# Patient Record
Sex: Male | Born: 1945 | Race: Black or African American | Hispanic: No | Marital: Single | Smoking: Former smoker
Health system: Southern US, Community
[De-identification: ages and names within clinical notes are randomized; demographics above are authoritative.]

## PROBLEM LIST (undated history)

## (undated) DIAGNOSIS — G629 Polyneuropathy, unspecified: Secondary | ICD-10-CM

## (undated) DIAGNOSIS — E119 Type 2 diabetes mellitus without complications: Secondary | ICD-10-CM

## (undated) DIAGNOSIS — I10 Essential (primary) hypertension: Secondary | ICD-10-CM

## (undated) DIAGNOSIS — N289 Disorder of kidney and ureter, unspecified: Secondary | ICD-10-CM

## (undated) HISTORY — PX: COLONOSCOPY: SHX5424

## (undated) SURGERY — Surgical Case
Anesthesia: *Unknown

---

## 2008-07-16 ENCOUNTER — Emergency Department (HOSPITAL_COMMUNITY): Admission: EM | Admit: 2008-07-16 | Discharge: 2008-07-16 | Payer: Self-pay | Admitting: Emergency Medicine

## 2010-05-07 LAB — CBC
MCHC: 33.7 g/dL (ref 30.0–36.0)
RDW: 13.7 % (ref 11.5–15.5)

## 2010-05-07 LAB — DIFFERENTIAL
Basophils Relative: 1 % (ref 0–1)
Lymphs Abs: 1.7 10*3/uL (ref 0.7–4.0)
Monocytes Relative: 12 % (ref 3–12)
Neutro Abs: 1.2 10*3/uL — ABNORMAL LOW (ref 1.7–7.7)
Neutrophils Relative %: 33 % — ABNORMAL LOW (ref 43–77)

## 2010-05-07 LAB — CK TOTAL AND CKMB (NOT AT ARMC)
CK, MB: 4 ng/mL (ref 0.3–4.0)
Total CK: 290 U/L — ABNORMAL HIGH (ref 7–232)

## 2010-05-07 LAB — COMPREHENSIVE METABOLIC PANEL
Alkaline Phosphatase: 64 U/L (ref 39–117)
BUN: 44 mg/dL — ABNORMAL HIGH (ref 6–23)
Calcium: 9.1 mg/dL (ref 8.4–10.5)
GFR calc non Af Amer: 19 mL/min — ABNORMAL LOW (ref >60)
Glucose, Bld: 103 mg/dL — ABNORMAL HIGH (ref 70–99)
Total Protein: 7.6 g/dL (ref 6.0–8.3)

## 2010-05-07 LAB — RAPID URINE DRUG SCREEN, HOSP PERFORMED
Amphetamines: NOT DETECTED
Barbiturates: NOT DETECTED
Benzodiazepines: POSITIVE — AB
Cocaine: NOT DETECTED
Opiates: NOT DETECTED

## 2010-05-07 LAB — URINALYSIS, ROUTINE W REFLEX MICROSCOPIC
Glucose, UA: NEGATIVE mg/dL
Hgb urine dipstick: NEGATIVE
Specific Gravity, Urine: 1.006 (ref 1.005–1.030)
pH: 5.5 (ref 5.0–8.0)

## 2010-05-07 LAB — TROPONIN I: Troponin I: 0.02 ng/mL (ref 0.00–0.06)

## 2016-12-13 ENCOUNTER — Emergency Department (HOSPITAL_COMMUNITY): Payer: Non-veteran care

## 2016-12-13 ENCOUNTER — Other Ambulatory Visit: Payer: Self-pay

## 2016-12-13 ENCOUNTER — Encounter (HOSPITAL_COMMUNITY): Payer: Self-pay

## 2016-12-13 ENCOUNTER — Inpatient Hospital Stay (HOSPITAL_COMMUNITY): Payer: Non-veteran care

## 2016-12-13 ENCOUNTER — Inpatient Hospital Stay (HOSPITAL_COMMUNITY)
Admission: EM | Admit: 2016-12-13 | Discharge: 2016-12-21 | DRG: 673 | Discharge status: Against Medical Advice | Payer: Non-veteran care | Attending: Family Medicine | Admitting: Family Medicine

## 2016-12-13 DIAGNOSIS — I10 Essential (primary) hypertension: Secondary | ICD-10-CM | POA: Diagnosis not present

## 2016-12-13 DIAGNOSIS — E8770 Fluid overload, unspecified: Secondary | ICD-10-CM | POA: Diagnosis present

## 2016-12-13 DIAGNOSIS — E538 Deficiency of other specified B group vitamins: Secondary | ICD-10-CM | POA: Diagnosis present

## 2016-12-13 DIAGNOSIS — Z720 Tobacco use: Secondary | ICD-10-CM | POA: Diagnosis not present

## 2016-12-13 DIAGNOSIS — N185 Chronic kidney disease, stage 5: Secondary | ICD-10-CM | POA: Diagnosis not present

## 2016-12-13 DIAGNOSIS — E1122 Type 2 diabetes mellitus with diabetic chronic kidney disease: Secondary | ICD-10-CM | POA: Diagnosis present

## 2016-12-13 DIAGNOSIS — D649 Anemia, unspecified: Secondary | ICD-10-CM | POA: Diagnosis not present

## 2016-12-13 DIAGNOSIS — N400 Enlarged prostate without lower urinary tract symptoms: Secondary | ICD-10-CM | POA: Diagnosis present

## 2016-12-13 DIAGNOSIS — E1140 Type 2 diabetes mellitus with diabetic neuropathy, unspecified: Secondary | ICD-10-CM | POA: Diagnosis present

## 2016-12-13 DIAGNOSIS — Z79899 Other long term (current) drug therapy: Secondary | ICD-10-CM | POA: Diagnosis not present

## 2016-12-13 DIAGNOSIS — Z8249 Family history of ischemic heart disease and other diseases of the circulatory system: Secondary | ICD-10-CM | POA: Diagnosis not present

## 2016-12-13 DIAGNOSIS — Z992 Dependence on renal dialysis: Secondary | ICD-10-CM

## 2016-12-13 DIAGNOSIS — N179 Acute kidney failure, unspecified: Secondary | ICD-10-CM | POA: Diagnosis present

## 2016-12-13 DIAGNOSIS — Z23 Encounter for immunization: Secondary | ICD-10-CM | POA: Diagnosis not present

## 2016-12-13 DIAGNOSIS — Z01810 Encounter for preprocedural cardiovascular examination: Secondary | ICD-10-CM | POA: Diagnosis not present

## 2016-12-13 DIAGNOSIS — N2581 Secondary hyperparathyroidism of renal origin: Secondary | ICD-10-CM | POA: Diagnosis present

## 2016-12-13 DIAGNOSIS — E8779 Other fluid overload: Secondary | ICD-10-CM

## 2016-12-13 DIAGNOSIS — D631 Anemia in chronic kidney disease: Secondary | ICD-10-CM | POA: Diagnosis present

## 2016-12-13 DIAGNOSIS — N186 End stage renal disease: Secondary | ICD-10-CM | POA: Diagnosis present

## 2016-12-13 DIAGNOSIS — Z794 Long term (current) use of insulin: Secondary | ICD-10-CM

## 2016-12-13 DIAGNOSIS — I120 Hypertensive chronic kidney disease with stage 5 chronic kidney disease or end stage renal disease: Principal | ICD-10-CM | POA: Diagnosis present

## 2016-12-13 DIAGNOSIS — Z419 Encounter for procedure for purposes other than remedying health state, unspecified: Secondary | ICD-10-CM

## 2016-12-13 DIAGNOSIS — E1129 Type 2 diabetes mellitus with other diabetic kidney complication: Secondary | ICD-10-CM | POA: Diagnosis present

## 2016-12-13 HISTORY — DX: Disorder of kidney and ureter, unspecified: N28.9

## 2016-12-13 HISTORY — DX: Essential (primary) hypertension: I10

## 2016-12-13 HISTORY — DX: Polyneuropathy, unspecified: G62.9

## 2016-12-13 HISTORY — DX: Type 2 diabetes mellitus without complications: E11.9

## 2016-12-13 LAB — BASIC METABOLIC PANEL
Anion gap: 12 (ref 5–15)
BUN: 98 mg/dL — ABNORMAL HIGH (ref 6–20)
CO2: 11 mmol/L — AB (ref 22–32)
Calcium: 5.8 mg/dL — CL (ref 8.9–10.3)
Chloride: 112 mmol/L — ABNORMAL HIGH (ref 101–111)
Creatinine, Ser: 13.73 mg/dL — ABNORMAL HIGH (ref 0.61–1.24)
GFR, EST AFRICAN AMERICAN: 4 mL/min — AB (ref >60)
GFR, EST NON AFRICAN AMERICAN: 3 mL/min — AB (ref >60)
Glucose, Bld: 78 mg/dL (ref 65–99)
Potassium: 4.7 mmol/L (ref 3.5–5.1)
Sodium: 135 mmol/L (ref 135–145)

## 2016-12-13 LAB — URINALYSIS, ROUTINE W REFLEX MICROSCOPIC
BACTERIA UA: NONE SEEN
Bilirubin Urine: NEGATIVE
Glucose, UA: 50 mg/dL — AB
KETONES UR: NEGATIVE mg/dL
Leukocytes, UA: NEGATIVE
Nitrite: NEGATIVE
PH: 6 (ref 5.0–8.0)
PROTEIN: 100 mg/dL — AB
SQUAMOUS EPITHELIAL / LPF: NONE SEEN
Specific Gravity, Urine: 1.008 (ref 1.005–1.030)

## 2016-12-13 LAB — RETICULOCYTES
RBC.: 1.78 MIL/uL — ABNORMAL LOW (ref 4.22–5.81)
RETIC CT PCT: 1.6 % (ref 0.4–3.1)
Retic Count, Absolute: 28.5 10*3/uL (ref 19.0–186.0)

## 2016-12-13 LAB — CBC
HCT: 12.6 % — ABNORMAL LOW (ref 39.0–52.0)
Hemoglobin: 4.1 g/dL — CL (ref 13.0–17.0)
MCH: 25.2 pg — AB (ref 26.0–34.0)
MCHC: 32.5 g/dL (ref 30.0–36.0)
MCV: 77.3 fL — ABNORMAL LOW (ref 78.0–100.0)
PLATELETS: 200 10*3/uL (ref 150–400)
RBC: 1.63 MIL/uL — AB (ref 4.22–5.81)
RDW: 16.4 % — ABNORMAL HIGH (ref 11.5–15.5)
WBC: 4.9 10*3/uL (ref 4.0–10.5)

## 2016-12-13 LAB — PREPARE RBC (CROSSMATCH)

## 2016-12-13 LAB — ABO/RH: ABO/RH(D): A POS

## 2016-12-13 LAB — PROTIME-INR
INR: 1.33
PROTHROMBIN TIME: 16.4 s — AB (ref 11.4–15.2)

## 2016-12-13 LAB — APTT: aPTT: 42 seconds — ABNORMAL HIGH (ref 24–36)

## 2016-12-13 LAB — POC OCCULT BLOOD, ED: FECAL OCCULT BLD: NEGATIVE

## 2016-12-13 LAB — FIBRINOGEN: FIBRINOGEN: 432 mg/dL (ref 210–475)

## 2016-12-13 MED ORDER — TERAZOSIN HCL 1 MG PO CAPS
1.0000 mg | ORAL_CAPSULE | Freq: Every day | ORAL | Status: DC
  Administered 2016-12-14 – 2016-12-21 (×8): 1 mg via ORAL
  Filled 2016-12-13 (×8): qty 1

## 2016-12-13 MED ORDER — POLYETHYLENE GLYCOL 3350 17 G PO PACK
17.0000 g | PACK | Freq: Every day | ORAL | Status: DC | PRN
  Filled 2016-12-13: qty 1

## 2016-12-13 MED ORDER — ACETAMINOPHEN 325 MG PO TABS: 650.0000 mg | ORAL_TABLET | Freq: Four times a day (QID) | ORAL | Status: DC | PRN

## 2016-12-13 MED ORDER — SODIUM CHLORIDE 0.9 % IV SOLN
1.0000 g | Freq: Once | INTRAVENOUS | Status: AC
  Administered 2016-12-14: 1 g via INTRAVENOUS
  Filled 2016-12-13 (×2): qty 10

## 2016-12-13 MED ORDER — SODIUM CHLORIDE 0.9 % IV SOLN
Freq: Once | INTRAVENOUS | Status: AC
  Administered 2016-12-13: 23:00:00 via INTRAVENOUS

## 2016-12-13 MED ORDER — FUROSEMIDE 10 MG/ML IJ SOLN
120.0000 mg | Freq: Two times a day (BID) | INTRAVENOUS | Status: DC
  Administered 2016-12-14 – 2016-12-16 (×5): 120 mg via INTRAVENOUS
  Filled 2016-12-13: qty 10
  Filled 2016-12-13 (×2): qty 12
  Filled 2016-12-13: qty 10
  Filled 2016-12-13: qty 2
  Filled 2016-12-13 (×4): qty 12

## 2016-12-13 MED ORDER — INSULIN ASPART 100 UNIT/ML ~~LOC~~ SOLN
0.0000 [IU] | Freq: Every day | SUBCUTANEOUS | Status: DC
  Administered 2016-12-20: 2 [IU] via SUBCUTANEOUS

## 2016-12-13 MED ORDER — DIAZEPAM 2 MG PO TABS
1.0000 mg | ORAL_TABLET | Freq: Every day | ORAL | Status: DC
  Administered 2016-12-14 – 2016-12-20 (×8): 1 mg via ORAL
  Filled 2016-12-13 (×8): qty 1

## 2016-12-13 MED ORDER — CLONIDINE HCL ER 0.1 MG PO TB12: 0.2000 mg | ORAL_TABLET | Freq: Two times a day (BID) | ORAL | Status: DC

## 2016-12-13 MED ORDER — ZOLPIDEM TARTRATE 5 MG PO TABS
5.0000 mg | ORAL_TABLET | Freq: Every evening | ORAL | Status: DC | PRN
  Administered 2016-12-16 – 2016-12-20 (×4): 5 mg via ORAL
  Filled 2016-12-13 (×6): qty 1

## 2016-12-13 MED ORDER — CALCIUM CARBONATE ANTACID 500 MG PO CHEW
800.0000 mg | CHEWABLE_TABLET | Freq: Two times a day (BID) | ORAL | Status: DC
  Administered 2016-12-14 (×2): 800 mg via ORAL
  Filled 2016-12-13 (×2): qty 4

## 2016-12-13 MED ORDER — HEPARIN SODIUM (PORCINE) 5000 UNIT/ML IJ SOLN
5000.0000 [IU] | Freq: Three times a day (TID) | INTRAMUSCULAR | Status: DC
  Administered 2016-12-14 – 2016-12-21 (×12): 5000 [IU] via SUBCUTANEOUS
  Filled 2016-12-13 (×14): qty 1

## 2016-12-13 MED ORDER — INSULIN ASPART 100 UNIT/ML ~~LOC~~ SOLN
0.0000 [IU] | Freq: Three times a day (TID) | SUBCUTANEOUS | Status: DC
  Administered 2016-12-14 – 2016-12-15 (×2): 1 [IU] via SUBCUTANEOUS
  Administered 2016-12-16: 2 [IU] via SUBCUTANEOUS
  Administered 2016-12-16: 1 [IU] via SUBCUTANEOUS
  Administered 2016-12-17: 7 [IU] via SUBCUTANEOUS
  Administered 2016-12-18 (×2): 1 [IU] via SUBCUTANEOUS

## 2016-12-13 MED ORDER — SODIUM CHLORIDE 0.9 % IV SOLN
1.0000 g | Freq: Once | INTRAVENOUS | Status: AC
  Administered 2016-12-13: 1 g via INTRAVENOUS
  Filled 2016-12-13: qty 10

## 2016-12-13 MED ORDER — VITAMIN D 1000 UNITS PO TABS
1000.0000 [IU] | ORAL_TABLET | Freq: Every day | ORAL | Status: DC
  Administered 2016-12-14 – 2016-12-19 (×6): 1000 [IU] via ORAL
  Filled 2016-12-13 (×7): qty 1

## 2016-12-13 MED ORDER — CALCITRIOL 0.5 MCG PO CAPS
1.0000 ug | ORAL_CAPSULE | Freq: Every day | ORAL | Status: DC
  Administered 2016-12-14 – 2016-12-19 (×6): 1 ug via ORAL
  Filled 2016-12-13 (×5): qty 2

## 2016-12-13 MED ORDER — HYDRALAZINE HCL 20 MG/ML IJ SOLN
5.0000 mg | INTRAMUSCULAR | Status: DC | PRN
  Administered 2016-12-13 – 2016-12-14 (×3): 5 mg via INTRAVENOUS
  Filled 2016-12-13 (×3): qty 1

## 2016-12-13 MED ORDER — ONDANSETRON HCL 4 MG/2ML IJ SOLN: 4.0000 mg | Freq: Three times a day (TID) | INTRAMUSCULAR | Status: DC | PRN

## 2016-12-13 NOTE — ED Provider Notes (Signed)
[facility] Provider Note   CSN: [id number] Arrival date & time: 12/13/16  1756     History   Chief Complaint Chief Complaint  Patient presents with  . Leg Swelling    HPI [name] Dudley is a 71 y.o. male with a hx of Diabetes, retention, neuropathy, chronic kidney disease (unknown baseline creatinine) presents to the Emergency Department complaining of gradual, persistent, progressively worsening peripheral edema onset approximately 1 week ago.  Patient reports that it has progressed all the way up his thighs into his inguinal region.  He reports associated shortness of breath onset approximately 1 month ago.  He reports this has worsened significantly in the last several days to a week.  Patient reports he obtains his primary care at the [facility].  He reports that he previously had discussions about possible dialysis but have not yet moved in that direction.  Patient reports it has been several months since his last set of blood work.  He is also reporting significant and severe muscle cramps worse with use of any muscle group.  Patient denies headache, chest pain, abdominal pain, nausea, vomiting, diarrhea, weakness, dizziness, syncope.  Patient denies hematuria, melena, hematochezia, epistaxis, gingival bleeding.  He denies a previous bleeding disorder.  He denies being anticoagulated.  The history is provided by the patient and medical records. No language interpreter was used.    Past Medical History:  Diagnosis Date  . Diabetes mellitus without complication (HCC)   . Hypertension   . Neuropathy   . Renal disorder     Patient Active Problem List   Diagnosis Date Noted  . History of end stage renal disease 12/13/2016  . Anemia due to end stage renal disease (HCC) 12/13/2016  . Hypocalcemia 12/13/2016  . Hypertension     History reviewed. No pertinent surgical history.     Home Medications    Prior to Admission medications   Not on  File    Family History History reviewed. No pertinent family history.  Social History Social History   Tobacco Use  . Smoking status: Unknown If Ever Smoked  . Smokeless tobacco: Never Used  Substance Use Topics  . Alcohol use: No    Frequency: Never  . Drug use: Not on file     Allergies   Patient has no known allergies.   Review of Systems Review of Systems  Constitutional: Positive for fatigue. Negative for appetite change, diaphoresis, fever and unexpected weight change.  HENT: Negative for mouth sores.   Eyes: Negative for visual disturbance.  Respiratory: Positive for shortness of breath (exertional). Negative for cough, chest tightness and wheezing.   Cardiovascular: Positive for leg swelling. Negative for chest pain and palpitations.  Gastrointestinal: Negative for abdominal pain, constipation, diarrhea, nausea and vomiting.  Endocrine: Negative for polydipsia, polyphagia and polyuria.  Genitourinary: Negative for dysuria, frequency, hematuria and urgency.  Musculoskeletal: Negative for back pain and neck stiffness.  Skin: Positive for pallor. Negative for rash.  Allergic/Immunologic: Negative for immunocompromised state.  Neurological: Negative for syncope, light-headedness and headaches.  Hematological: Does not bruise/bleed easily.  Psychiatric/Behavioral: Negative for sleep disturbance. The patient is not nervous/anxious.      Physical Exam Updated Vital Signs BP (!) 179/91   Pulse 75   Temp 97.7 F (36.5 C) (Oral)   Resp 18   SpO2 100%   Physical Exam  Constitutional: He appears well-developed and well-nourished. No distress.  Awake, alert, nontoxic appearance  HENT:  Head: Normocephalic and atraumatic.  Mouth/Throat: Oropharynx is clear and moist. No oropharyngeal exudate.  Very pale conjunctiva and mucous membranes Positive Chvostek's sign  Eyes: Conjunctivae are normal. No scleral icterus.  Neck: Normal range of motion. Neck supple.    Intermittent right sided neck spasm  Cardiovascular: Normal rate, regular rhythm and intact distal pulses.  Pulses:      Radial pulses are 2+ on the right side, and 2+ on the left side.       Dorsalis pedis pulses are 2+ on the right side, and 2+ on the left side.  Capillary refill = 4sec  Pulmonary/Chest: Effort normal and breath sounds normal. No respiratory distress. He has no wheezes.  Equal chest expansion  Abdominal: Soft. Bowel sounds are normal. He exhibits no mass. There is no tenderness. There is no rebound and no guarding.  Genitourinary: Rectal exam shows no external hemorrhoid, no internal hemorrhoid, no fissure, no mass, no tenderness and anal tone normal.  Genitourinary Comments: DRE with brown stool; no gross blood  Musculoskeletal: Normal range of motion. He exhibits edema (significant 3+ pitting edema to the BLE from distal forefoot to 2+ proximal thigh ).  Positive Trousseau  Neurological: He is alert.  Speech is clear and goal oriented Moves extremities without ataxia  Skin: Skin is warm and dry. He is not diaphoretic. There is pallor.  Psychiatric: He has a normal mood and affect.  Nursing note and vitals reviewed.    ED Treatments / Results  Labs (all labs ordered are listed, but only abnormal results are displayed) Labs Reviewed  BASIC METABOLIC PANEL - Abnormal; Notable for the following components:      Result Value   Chloride 112 (*)    CO2 11 (*)    BUN 98 (*)    Creatinine, Ser 13.73 (*)    Calcium 5.8 (*)    GFR calc non Af Amer 3 (*)    GFR calc Af Amer 4 (*)    All other components within normal limits  CBC - Abnormal; Notable for the following components:   RBC 1.63 (*)    Hemoglobin 4.1 (*)    HCT 12.6 (*)    MCV 77.3 (*)    MCH 25.2 (*)    RDW 16.4 (*)    All other components within normal limits  URINALYSIS, ROUTINE W REFLEX MICROSCOPIC - Abnormal; Notable for the following components:   Color, Urine STRAW (*)    Glucose, UA 50 (*)     Hgb urine dipstick MODERATE (*)    Protein, ur 100 (*)    All other components within normal limits  FIBRINOGEN  APTT  PROTIME-INR  VITAMIN B12  FOLATE  IRON AND TIBC  FERRITIN  RETICULOCYTES  POC OCCULT BLOOD, ED  CBG MONITORING, ED  TYPE AND SCREEN  PREPARE RBC (CROSSMATCH)  ABO/RH    EKG  EKG Interpretation  Date/Time:  Friday December 13 2016 18:01:28 EST Ventricular Rate:  77 PR Interval:  196 QRS Duration: 80 QT Interval:  444 QTC Calculation: 502 R Axis:   60 Text Interpretation:  Normal sinus rhythm Prolonged QT Abnormal ECG When compared to prior, slightkly longer QT.  No STEMI Confirmed by Theda Belfast ([id number]) on 12/13/2016 7:35:59 PM       Radiology US Renal  Result Date: 12/14/2016 CLINICAL DATA:  Acute onset of renal insufficiency. EXAM: RENAL / URINARY TRACT ULTRASOUND COMPLETE COMPARISON:  None. FINDINGS: Right Kidney: Length: 8.5 cm. Increased parenchymal echogenicity is noted. A 0.5 cm cyst is noted  at the upper pole of the right kidney. No hydronephrosis visualized. Left Kidney: Length: 8.5 cm. Increased parenchymal echogenicity is noted. A 0.7 cm cyst is noted at the upper pole of the left kidney. No hydronephrosis visualized. Bladder: Appears normal for degree of bladder distention. Small bilateral pleural effusions are noted. IMPRESSION: 1. No evidence of hydronephrosis. 2. Increased renal parenchymal echogenicity raises concern for medical renal disease. 3. Tiny bilateral renal cysts seen. 4. Small bilateral pleural effusions noted. Electronically Signed   By: Roanna Raider M.D.   On: 12/14/2016 03:36   Dg Chest Port 1 View  Result Date: 12/13/2016 CLINICAL DATA:  71 year old male with fluid overload. EXAM: PORTABLE CHEST 1 VIEW COMPARISON:  None. FINDINGS: There small bilateral pleural effusions and minimal associated bibasilar compressive atelectasis. Pneumonia is not excluded. Clinical correlation is recommended. No pneumothorax. Borderline  cardiomegaly with mild vascular congestion. No acute osseous pathology. IMPRESSION: Borderline cardiomegaly with mild vascular congestion and small pleural effusions. Electronically Signed   By: Elgie Collard M.D.   On: 12/13/2016 22:33    Procedures Procedures (including critical care time)  CRITICAL CARE Performed by: Dahlia Client Jhamir Pickup Total critical care time: 60 minutes Critical care time was exclusive of separately billable procedures and treating other patients. Critical care was necessary to treat or prevent imminent or life-threatening deterioration. Critical care was time spent personally by me on the following activities: development of treatment plan with patient and/or surrogate as well as nursing, discussions with consultants, evaluation of patient's response to treatment, examination of patient, obtaining history from patient or surrogate, ordering and performing treatments and interventions, ordering and review of laboratory studies, ordering and review of radiographic studies, pulse oximetry and re-evaluation of patient's condition.   Medications Ordered in ED Medications  0.9 %  sodium chloride infusion (not administered)  calcium gluconate 1 g in sodium chloride 0.9 % 100 mL IVPB (not administered)  hydrALAZINE (APRESOLINE) injection 5 mg (not administered)  calcium gluconate 1 g in sodium chloride 0.9 % 100 mL IVPB (1 g Intravenous New Bag/Given 12/13/16 2129)     Initial Impression / Assessment and Plan / ED Course  I have reviewed the triage vital signs and the nursing notes.  Pertinent labs & imaging results that were available during my care of the patient were reviewed by me and considered in my medical decision making (see chart for details).  Clinical Course as of Dec 13 2198  Fri Dec 13, 2016  2138 Discussed with Dr. Marisue Humble who agrees that pt is does not currently need emergent dialysis, but will need admission and likely dialysis while inpatient.  [HM]    2158 Gust with Dr. Clyde Lundborg who will admit for blood transfusion, calcium and further workup of his acute renal failure.  [HM]    Clinical Course User Index [HM] Dartanyan Deasis, Dahlia Client, [place]    Patient presents with severe anemia and hypocalcemia.  DRE with brown stool.  Denies GI bleeding.  Significant worsening AK I now acute renal failure as compared to lab work on file from 2010.  Unknown baseline creatinine however patient is not currently on dialysis.  On exam, patient is very pale.  No tachycardia or chest pain.  Risk and benefit of a blood transfusion discussed and patient accepts.  Blood ordered.  Will need admission.  The patient was discussed with Dr. Rush Landmark who agrees with the treatment plan.   Final Clinical Impressions(s) / ED Diagnoses   Final diagnoses:  AKI (acute kidney injury) (HCC)  Other hypervolemia  Anemia, unspecified type  Hypocalcemia    ED Discharge Orders    None       Mardene SayerMuthersbaugh, Boyd KerbsHannah, PA-C 12/14/16 [id number]    Tegeler, Canary Brimhristopher J, MD 12/16/16 [id number]

## 2016-12-13 NOTE — Consult Note (Signed)
Lavella LemonsSylvester Bowersox Admit Date: 12/13/2016 12/13/2016 Arita MissSANFORD, Kyeisha Janowicz B Requesting Physician:  Tegeler MD  Reason for Consult:  Renal failure, Anemia HPI:  71 year old male who presented to the emergency room this evening with progressive lower extremity edema.  In his evaluation he was found to have a hemoglobin of 4.1, BUN of 98, creatinine of 13.7, bicarbonate 11, potassium 4.7, sodium of 135.  He [place] [facility] [place]  He follows with a nephrologist and was told 3 months ago that he could start on dialysis but he opted not to.  He has not pursued vascular access.  A creatinine from 2010 in our system was 3.3.  Urine analysis today with 2+ protein and no erythrocytes per high-powered field.  Patient denies use of nonsteroidals.  He denies lower urinary tract symptoms.  He denies significant anorexia, nausea, malaise.  He does endorse on and off emesis.  Other available history includes diabetes and hypertension.  He is quite concerned about any impact of dialysis on his finances which are very tight and transportation.  At length I discussed with him that he has very low kidney function and I strongly recommended initiating dialysis.  Eventually agreed to do so as well as receiving vascular access.  In the emergency room patient has started PRBC transfusion.    Creatinine, Ser (mg/dL)  Date Value  16/10/960411/16/2018 13.73 (H)  07/16/2008 3.32 (H)  ] I/Os:  ROS NSAIDS: denies IV Contrast no TMP/SMX no Hypotension no Balance of 12 systems is negative w/ exceptions as above  PMH  Past Medical History:  Diagnosis Date  . Diabetes mellitus without complication (HCC)   . Hypertension   . Neuropathy   . Renal disorder    PSH History reviewed. No pertinent surgical history. FH History reviewed. No pertinent family history. SH  has an unknown smoking status. he has never used smokeless tobacco. He reports that he does not drink alcohol. His drug history is not  on file. Allergies No Known Allergies Home medications Prior to Admission medications   Medication Sig Start Date End Date Taking? Authorizing Provider  Cholecalciferol (VITAMIN D PO) Take 1 capsule daily by mouth.   Yes [provider]  CLONIDINE HCL PO Take 2 tablets 2 (two) times daily by mouth.   Yes [provider]  DIAZEPAM PO Take 1 tablet at bedtime by mouth.   Yes [provider]  insulin glargine (LANTUS) 100 UNIT/ML injection Inject 4 Units daily as needed into the skin (diabetes).   Yes [provider]  TERAZOSIN HCL PO Take 1 capsule at bedtime by mouth.   Yes [provider]    Current Medications Scheduled Meds: . [START ON 12/14/2016] cholecalciferol  1,000 Units Oral Daily  . cloNIDine HCl  0.2 mg Oral BID  . diazepam  1 mg Oral QHS  . heparin  5,000 Units Subcutaneous Q8H  . insulin aspart  0-5 Units Subcutaneous QHS  . [START ON 12/14/2016] insulin aspart  0-9 Units Subcutaneous TID WC  . terazosin  1 mg Oral QHS   Continuous Infusions: . calcium gluconate 1 GM IV    . [START ON 12/14/2016] furosemide     PRN Meds:.acetaminophen, hydrALAZINE, ondansetron (ZOFRAN) IV, polyethylene glycol, zolpidem  CBC Recent Labs  Lab 12/13/16 1813  WBC 4.9  HGB 4.1*  HCT 12.6*  MCV 77.3*  PLT 200   Basic Metabolic Panel Recent Labs  Lab 12/13/16 1813  NA 135  K 4.7  CL  112*  CO2 11*  GLUCOSE 78  BUN 98*  CREATININE 13.73*  CALCIUM 5.8*    Physical Exam  Blood pressure (!) 192/97, pulse 70, temperature (!) 97.5 F (36.4 C), temperature source Oral, resp. rate 11, SpO2 100 %. GEN: NAD ENT: NCaT EYES: EOMI CV: No rub, nl s1s2, RRR PULM: ctab ,no wrr ABD: s/nt/nd, no suprapubic tenderness SKIN: yellow tint to skin EXT: 3+ pitting LEE into prox legs   Assessment/Plan 24M with new ESRD, severe anemia.    1. New ESRD.  Pt agreeable to start iHD.   1. Need TDC and AVF eval, will call [facility] in AM; NPO p MN 2. [facility]  [facility] first Tx with high ca bath 3. Will need to CLIP patient; [facility] pt 4. Renal US, dobut obstruction but would like to exclude given some hx/o prostatism (on terazosin) 2. Severe anemia 1. rec transfusion now 2. Check Fe levels  3. Will start ESA with HD 4. SPEP and sFLC given age + renal failure 3. Hypocalcemia; CKD-BMD: Check P and PTH; start VDRA; high Ca bath with HD; check albumin; Tums BID not as binder 4. Hypervolemia / LEE: will address with HD 5. HTN 6. DM2  1.    Sabra Heckyan Mailee Klaas MD [phone] pgr 12/13/2016, 11:12 PM

## 2016-12-13 NOTE — ED Triage Notes (Signed)
[facility]- pt coming from home with complaint of leg swelling X3 days. Pt reports he has hx of renal failure but not on dialysis. Pt reports legs have become very heavy. Pt alert and oriented X4. Hypertensive with EMS.

## 2016-12-13 NOTE — H&P (Signed)
History and Physical    [name] Dudley ZOX:[id number] DOB: [birth year] DOA: 12/13/2016  Referring MD/NP/PA:   PCP: [facility]   Patient coming from:  The patient is coming from home.  At baseline, pt is independent for most of ADL.  Chief Complaint: Shortness of breath, leg edema  HPI: [name] Dudley is a 71 y.o. male with medical history significant of possible CKD, hypertension, diabetes mellitus, anxiety, tobacco abuse, BPH, who presents with shortness breath and leg edema.  Patient states that he has a chronic kidney disease. His renal doctor in [facility] told him that he probably would need to start dialysis, but he opted not to. He has not pursued vascular access. Patient states that in the past several days, he has SOB, which has been progressively getting worse. He also has a worsening bilateral leg edema. Denies any chest pain. He has some mild dry cough, no fever or chills. Patient denies any rectal bleeding, hematochezia, hematuria, hematemesis. He does not use NSIADs. No nausea, vomiting, diarrhea, abdominal pain, symptoms of UTI or unilateral weakness.  ED Course: pt was found to have hemoglobin of 4.1, negative FOBT, Ca 5.8, BUN of 98, creatinine of 13.7, bicarbonate 11, potassium 4.7, sodium of 135. Urine analysis today with 2+ protein and no erythrocytes per high-powered field. Patient is admitted to telemetry bed  Review of Systems:   General: no fevers, chills, no body weight gain, has poor appetite, has fatigue HEENT: no blurry vision, hearing changes or sore throat Respiratory: has dyspnea, coughing, no wheezing CV: no chest pain, no palpitations GI: no nausea, vomiting, abdominal pain, diarrhea, constipation GU: no dysuria, burning on urination, increased urinary frequency, hematuria  Ext: has leg edema Neuro: no unilateral weakness, numbness, or tingling, no vision change or hearing loss Skin: no rash, no skin tear. MSK: No muscle spasm,  no deformity, no limitation of range of movement in spin Heme: No easy bruising.  Travel history: No recent long distant travel.  Allergy: No Known Allergies  Past Medical History:  Diagnosis Date  . Diabetes mellitus without complication (HCC)   . Hypertension   . Neuropathy   . Renal disorder     History reviewed. No pertinent surgical history.  Social History:  has an unknown smoking status. he has never used smokeless tobacco. He reports that he does not drink alcohol. His drug history is not on file.  Family History:  Family History  Problem Relation Age of Onset  . Cancer Mother        Patient does not know which type of cancer  . Hypertension Father      Prior to Admission medications   Not on File    Physical Exam: Vitals:   12/14/16 0200 12/14/16 0215 12/14/16 0526 12/14/16 0557  BP: (!) 164/78 (!) 162/77 (!) 154/79 (!) 168/76  Pulse: 67 66 67 68  Resp: 16 16 14 16   Temp: 97.8 F (36.6 C) 97.7 F (36.5 C) 97.7 F (36.5 C) 97.8 F (36.6 C)  TempSrc: Oral Oral Oral Oral  SpO2:  100% 100% 100%  Weight:      Height:       General: Not in acute distress. pale looking.  HEENT:       Eyes: PERRL, EOMI, no scleral icterus.       ENT: No discharge from the ears and nose, no pharynx injection, no tonsillar enlargement.        Neck: No JVD, no bruit, no mass felt. Heme:  No neck lymph node enlargement. Cardiac: S1/S2, RRR, No murmurs, No gallops or rubs. Respiratory: No rales, wheezing, rhonchi or rubs. GI: Soft, nondistended, nontender, no rebound pain, no organomegaly, BS present. GU: No hematuria Ext: 3+ pitting leg edema bilaterally. 2+DP/PT pulse bilaterally. Musculoskeletal: No joint deformities, No joint redness or warmth, no limitation of ROM in spin. Skin: No rashes.  Neuro: Alert, oriented X3, cranial nerves II-XII grossly intact, moves all extremities normally. Psych: Patient is not psychotic, no suicidal or hemocidal ideation.  Labs on Admission:  I have personally reviewed following labs and imaging studies  CBC: Recent Labs  Lab 12/13/16 1813  WBC 4.9  HGB 4.1*  HCT 12.6*  MCV 77.3*  PLT 200   Basic Metabolic Panel: Recent Labs  Lab 12/13/16 1813 12/14/16 0009  NA 135  --   K 4.7  --   CL 112*  --   CO2 11*  --   GLUCOSE 78  --   BUN 98*  --   CREATININE 13.73*  --   CALCIUM 5.8*  --   PHOS  --  9.3*   GFR: Estimated Creatinine Clearance: 7.4 mL/min (A) (by C-G formula based on SCr of 13.73 mg/dL (H)). Liver Function Tests: Recent Labs  Lab 12/14/16 0009  ALBUMIN 2.6*   No results for input(s): LIPASE, AMYLASE in the last 168 hours. No results for input(s): AMMONIA in the last 168 hours. Coagulation Profile: Recent Labs  Lab 12/13/16 2116  INR 1.33   Cardiac Enzymes: No results for input(s): CKTOTAL, CKMB, CKMBINDEX, TROPONINI in the last 168 hours. BNP (last 3 results) No results for input(s): PROBNP in the last 8760 hours. HbA1C: No results for input(s): HGBA1C in the last 72 hours. CBG: Recent Labs  Lab 12/14/16 0131  GLUCAP 108*   Lipid Profile: No results for input(s): CHOL, HDL, LDLCALC, TRIG, CHOLHDL, LDLDIRECT in the last 72 hours. Thyroid Function Tests: No results for input(s): TSH, T4TOTAL, FREET4, T3FREE, THYROIDAB in the last 72 hours. Anemia Panel: Recent Labs    12/13/16 2230  VITAMINB12 727  FOLATE 13.0  FERRITIN 309  TIBC 196*  IRON 106  RETICCTPCT 1.6   Urine analysis:    Component Value Date/Time   COLORURINE STRAW (A) 12/13/2016 1910   APPEARANCEUR CLEAR 12/13/2016 1910   LABSPEC 1.008 12/13/2016 1910   PHURINE 6.0 12/13/2016 1910   GLUCOSEU 50 (A) 12/13/2016 1910   HGBUR MODERATE (A) 12/13/2016 1910   BILIRUBINUR NEGATIVE 12/13/2016 1910   KETONESUR NEGATIVE 12/13/2016 1910   PROTEINUR 100 (A) 12/13/2016 1910   UROBILINOGEN 0.2 07/16/2008 0820   NITRITE NEGATIVE 12/13/2016 1910   LEUKOCYTESUR NEGATIVE 12/13/2016 1910   Sepsis  Labs: @LABRCNTIP (procalcitonin:4,lacticidven:4) ) Recent Results (from the past 240 hour(s))  MRSA PCR Screening     Status: None   Collection Time: 12/14/16  3:22 AM  Result Value Ref Range Status   MRSA by PCR NEGATIVE NEGATIVE Final    Comment:        The GeneXpert MRSA Assay (FDA approved for NASAL specimens only), is one component of a comprehensive MRSA colonization surveillance program. It is not intended to diagnose MRSA infection nor to guide or monitor treatment for MRSA infections.      Radiological Exams on Admission: [facility]  Result Date: 12/14/2016 CLINICAL DATA:  Acute onset of renal insufficiency. EXAM: RENAL / URINARY TRACT ULTRASOUND COMPLETE COMPARISON:  None. FINDINGS: Right Kidney: Length: 8.5 cm. Increased parenchymal echogenicity is noted. A 0.5 cm cyst is noted  at the upper pole of the right kidney. No hydronephrosis visualized. Left Kidney: Length: 8.5 cm. Increased parenchymal echogenicity is noted. A 0.7 cm cyst is noted at the upper pole of the left kidney. No hydronephrosis visualized. Bladder: Appears normal for degree of bladder distention. Small bilateral pleural effusions are noted. IMPRESSION: 1. No evidence of hydronephrosis. 2. Increased renal parenchymal echogenicity raises concern for medical renal disease. 3. Tiny bilateral renal cysts seen. 4. Small bilateral pleural effusions noted. Electronically Signed   By: Roanna RaiderJeffery  Chang M.D.   On: 12/14/2016 03:36   Dg Chest Port 1 View  Result Date: 12/13/2016 CLINICAL DATA:  71 year old male with fluid overload. EXAM: PORTABLE CHEST 1 VIEW COMPARISON:  None. FINDINGS: There small bilateral pleural effusions and minimal associated bibasilar compressive atelectasis. Pneumonia is not excluded. Clinical correlation is recommended. No pneumothorax. Borderline cardiomegaly with mild vascular congestion. No acute osseous pathology. IMPRESSION: Borderline cardiomegaly with mild vascular congestion and small  pleural effusions. Electronically Signed   By: Elgie CollardArash  Radparvar M.D.   On: 12/13/2016 22:33     EKG: Independently reviewed.  Sinus rhythm, QTC 502, anteroseptal infarction pattern.  Assessment/Plan Principal Problem:   Anemia due to end stage renal disease (HCC) Active Problems:   Hypertension   Hypocalcemia   ESRD (end stage renal disease) (HCC)   BPH (benign prostatic hyperplasia)   Type II diabetes mellitus with renal manifestations (HCC)   Anemia due to end stage renal disease (HCC): Hemoglobin 4.1. No bleeding noted. FOBT negative. Most likely due to end-stage renal disease. Patient is symptomatic.  - Admit to telemetry bed as inpatient. - Anemia panel -Transfuse  3 unit of blood -Follow-up CBC  End stage renal disease: pt has ESRD now. BUN of 98, creatinine of 13.7, bicarbonate 11, potassium 4.7. Likely need to start HD. -check US-renal -renal was consulted -CM and SW consult  Dyspnea: Most likely due to fluid overload secondary to end-stage renal disease. Given long history of hypertension, will also need to rule out CHF. -Lasix 120 mg twice a day -Follow-up 2-D echo  HTN:  -Continue home medications: clonidine -IV hydralazine prn  Hypocalcemia: Ca 5.8. Due to ESRD. -give 2 g of calcium gluconate -Start calcitriol, calcium carbonate and vitamin D  BPH (benign prostatic hyperplasia): --terazosin  Diabetes mellitus with complications of the ESRD: A1c is not on record. Patient is taking Lantus. blood sugar is 78. -Hold Lantus -SSI -Check A1c   DVT ppx: SCD Code Status: Full code Family Communication: None at bed side.        Disposition Plan:  Anticipate discharge back to previous home environment Consults called:  Renal, Dr.Sanford Admission status:  Inpatient/tele    Date of Service 12/14/2016    Lorretta HarpXilin Valentina Alcoser [facility] Pager [phone]  If 7PM-7AM, please contact night-coverage [email or web address] Password [email or web address] 12/14/2016, 7:55 AM

## 2016-12-13 NOTE — ED Notes (Signed)
Patient having US at the bedside.  To take to the floor when completed.

## 2016-12-14 ENCOUNTER — Encounter (HOSPITAL_COMMUNITY): Payer: Self-pay | Admitting: Internal Medicine

## 2016-12-14 ENCOUNTER — Inpatient Hospital Stay (HOSPITAL_COMMUNITY): Payer: Non-veteran care

## 2016-12-14 ENCOUNTER — Other Ambulatory Visit: Payer: Self-pay

## 2016-12-14 DIAGNOSIS — E1129 Type 2 diabetes mellitus with other diabetic kidney complication: Secondary | ICD-10-CM | POA: Diagnosis present

## 2016-12-14 DIAGNOSIS — N185 Chronic kidney disease, stage 5: Secondary | ICD-10-CM

## 2016-12-14 DIAGNOSIS — N400 Enlarged prostate without lower urinary tract symptoms: Secondary | ICD-10-CM | POA: Diagnosis present

## 2016-12-14 DIAGNOSIS — D649 Anemia, unspecified: Secondary | ICD-10-CM

## 2016-12-14 DIAGNOSIS — N179 Acute kidney failure, unspecified: Secondary | ICD-10-CM

## 2016-12-14 LAB — GLUCOSE, CAPILLARY
GLUCOSE-CAPILLARY: 108 mg/dL — AB (ref 65–99)
GLUCOSE-CAPILLARY: 123 mg/dL — AB (ref 65–99)
GLUCOSE-CAPILLARY: 103 mg/dL — AB (ref 65–99)
Glucose-Capillary: 95 mg/dL (ref 65–99)
Glucose-Capillary: 137 mg/dL — ABNORMAL HIGH (ref 65–99)

## 2016-12-14 LAB — BASIC METABOLIC PANEL
ANION GAP: 11 (ref 5–15)
BUN: 98 mg/dL — ABNORMAL HIGH (ref 6–20)
CHLORIDE: 111 mmol/L (ref 101–111)
CO2: 11 mmol/L — ABNORMAL LOW (ref 22–32)
Calcium: 6.3 mg/dL — CL (ref 8.9–10.3)
Creatinine, Ser: 13.27 mg/dL — ABNORMAL HIGH (ref 0.61–1.24)
GFR calc non Af Amer: 3 mL/min — ABNORMAL LOW (ref >60)
GFR, EST AFRICAN AMERICAN: 4 mL/min — AB (ref >60)
Glucose, Bld: 77 mg/dL (ref 65–99)
POTASSIUM: 5.1 mmol/L (ref 3.5–5.1)
SODIUM: 133 mmol/L — AB (ref 135–145)

## 2016-12-14 LAB — PHOSPHORUS: Phosphorus: 9.3 mg/dL — ABNORMAL HIGH (ref 2.5–4.6)

## 2016-12-14 LAB — CBC
HEMATOCRIT: 19.5 % — AB (ref 39.0–52.0)
HEMOGLOBIN: 6.5 g/dL — AB (ref 13.0–17.0)
MCH: 26.6 pg (ref 26.0–34.0)
MCHC: 33.3 g/dL (ref 30.0–36.0)
MCV: 79.9 fL (ref 78.0–100.0)
Platelets: 178 10*3/uL (ref 150–400)
RBC: 2.44 MIL/uL — AB (ref 4.22–5.81)
RDW: 15.5 % (ref 11.5–15.5)
WBC: 5.7 10*3/uL (ref 4.0–10.5)

## 2016-12-14 LAB — ALBUMIN: Albumin: 2.6 g/dL — ABNORMAL LOW (ref 3.5–5.0)

## 2016-12-14 LAB — PREPARE RBC (CROSSMATCH)

## 2016-12-14 LAB — IRON AND TIBC
Iron: 106 ug/dL (ref 45–182)
SATURATION RATIOS: 54 % — AB (ref 17.9–39.5)
TIBC: 196 ug/dL — AB (ref 250–450)
UIBC: 90 ug/dL

## 2016-12-14 LAB — VITAMIN B12: Vitamin B-12: 727 pg/mL (ref 180–914)

## 2016-12-14 LAB — MRSA PCR SCREENING: MRSA by PCR: NEGATIVE

## 2016-12-14 LAB — SODIUM, URINE, RANDOM: SODIUM UR: 68 mmol/L

## 2016-12-14 LAB — HEMOGLOBIN AND HEMATOCRIT, BLOOD
HEMATOCRIT: 24.3 % — AB (ref 39.0–52.0)
HEMOGLOBIN: 8.1 g/dL — AB (ref 13.0–17.0)

## 2016-12-14 LAB — CREATININE, URINE, RANDOM: CREATININE, URINE: 49.21 mg/dL

## 2016-12-14 LAB — FERRITIN: Ferritin: 309 ng/mL (ref 24–336)

## 2016-12-14 LAB — FOLATE: FOLATE: 13 ng/mL (ref >5.9)

## 2016-12-14 MED ORDER — SODIUM CHLORIDE 0.9 % IV SOLN
Freq: Once | INTRAVENOUS | Status: AC
  Administered 2016-12-14: 15:00:00 via INTRAVENOUS

## 2016-12-14 MED ORDER — CLONIDINE HCL 0.2 MG PO TABS
0.2000 mg | ORAL_TABLET | Freq: Two times a day (BID) | ORAL | Status: DC
  Administered 2016-12-14 – 2016-12-20 (×13): 0.2 mg via ORAL
  Filled 2016-12-14 (×13): qty 1

## 2016-12-14 MED ORDER — DARBEPOETIN ALFA 100 MCG/0.5ML IJ SOSY
100.0000 ug | PREFILLED_SYRINGE | INTRAMUSCULAR | Status: DC
  Filled 2016-12-14: qty 0.5

## 2016-12-14 MED ORDER — SODIUM CHLORIDE 0.9 % IV SOLN
1.0000 g | Freq: Once | INTRAVENOUS | Status: AC
  Administered 2016-12-14: 1 g via INTRAVENOUS
  Filled 2016-12-14: qty 10

## 2016-12-14 NOTE — Progress Notes (Signed)
BP increased greater that 180 systolic. 5 mg hydralazine IV administered per prn order. BP decreased. Will continue to monitor.   12/14/16 1910  Vitals  BP (!) 158/91  BP Location Right Arm  Patient Position (if appropriate) Lying  Pulse Rate 68  Pulse Rate Source Dinamap

## 2016-12-14 NOTE — H&P (View-Only) (Signed)
CONSULT NOTE   MRN : [id number]  Reason for Consult[facility] Referring Physician: Dr. Marisue HumbleSanford  History of Present Illness:  [name] Dudley is a 71 y.o. ([birth year]) male  Present cc: SOB and leg edema.  Pt has known Acute on CKD.  Despite being told he would likely need HD 3 months ago, reportedly pt declined to pursue permanent access placement.  The patient denies prior central venous catheters or permanent pacemakers.  Current Facility-Administered Medications  Medication Dose Route Frequency Provider Last Rate Last Dose  . acetaminophen (TYLENOL) tablet 650 mg  650 mg Oral Q6H PRN Lorretta HarpNiu, Xilin, MD      . calcitRIOL (ROCALTROL) capsule 1 mcg  1 mcg Oral Daily Sabra HeckSanford, Ryan B, MD      . calcium carbonate (TUMS - dosed in mg elemental calcium) chewable tablet 800 mg of elemental calcium  800 mg of elemental calcium Oral BID Sabra HeckSanford, Ryan B, MD      . cholecalciferol (VITAMIN D) tablet 1,000 Units  1,000 Units Oral Daily Lorretta HarpNiu, Xilin, MD      . cloNIDine (CATAPRES) tablet 0.2 mg  0.2 mg Oral BID Lorretta HarpNiu, Xilin, MD   0.2 mg at 12/14/16 0236  . diazepam (VALIUM) tablet 1 mg  1 mg Oral QHS Lorretta HarpNiu, Xilin, MD   1 mg at 12/14/16 [id number]  . furosemide (LASIX) 120 mg in dextrose 5 % 50 mL IVPB  120 mg Intravenous BID Lorretta HarpNiu, Xilin, MD      . heparin injection 5,000 Units  5,000 Units Subcutaneous Q8H Lorretta HarpNiu, Xilin, MD      . hydrALAZINE (APRESOLINE) injection 5 mg  5 mg Intravenous Q2H PRN Lorretta HarpNiu, Xilin, MD   5 mg at 12/13/16 2249  . insulin aspart (novoLOG) injection 0-5 Units  0-5 Units Subcutaneous QHS Lorretta HarpNiu, Xilin, MD      . insulin aspart (novoLOG) injection 0-9 Units  0-9 Units Subcutaneous TID WC Lorretta HarpNiu, Xilin, MD      . ondansetron [facility](ZOFRAN) injection 4 mg  4 mg Intravenous Q8H PRN Lorretta HarpNiu, Xilin, MD      . polyethylene glycol (MIRALAX / GLYCOLAX) packet 17 g  17 g Oral Daily PRN Lorretta HarpNiu, Xilin, MD      . terazosin (HYTRIN) capsule 1 mg  1 mg Oral QHS Lorretta HarpNiu, Xilin, MD   1 mg at 12/14/16 [id number]  . zolpidem  (AMBIEN) tablet 5 mg  5 mg Oral QHS PRN Lorretta HarpNiu, Xilin, MD        Pt meds include: Statin :No Betablocker: No ASA: No Other anticoagulants/antiplatelets: none  Past Medical History:  Diagnosis Date  . Diabetes mellitus without complication (HCC)   . Hypertension   . Neuropathy   . Renal disorder     Past Surgical History: none  Social History Social History   Tobacco Use  . Smoking status: Unknown If Ever Smoked  . Smokeless tobacco: Never Used  Substance Use Topics  . Alcohol use: No    Frequency: Never  . Drug use: Not on file    Family History Family History  Problem Relation Age of Onset  . Cancer Mother        Patient does not know which type of cancer  . Hypertension Father     No Known Allergies   REVIEW OF SYSTEMS  General: [ ]  Weight loss, [ ]  Fever, [ ]  chills Neurologic: [ ]  Dizziness, [ ]  Blackouts, [ ]  Seizure [ ]  Stroke, [ ]  "Mini stroke", [ ]  Slurred speech, [ ]   Temporary blindness; [ ]  weakness in arms or legs, [ ]  Hoarseness [ ]  Dysphagia Cardiac: [ ]  Chest pain/pressure, [ ]  Shortness of breath at rest [x ] Shortness of breath with exertion, [ ]  Atrial fibrillation or irregular heartbeat  Vascular: [ ]  Pain in legs with walking, [ ]  Pain in legs at rest, [ ]  Pain in legs at night,  [ ]  Non-healing ulcer, [ ]  Blood clot in vein/DVT,   Pulmonary: [ ]  Home oxygen, [ ]  Productive cough, [ ]  Coughing up blood, [ ]  Asthma,  [ ]  Wheezing [ ]  COPD Musculoskeletal:  [ ]  Arthritis, [ ]  Low back pain, [ ]  Joint pain Hematologic: [ ]  Easy Bruising, [ ]  Anemia; [ ]  Hepatitis Gastrointestinal: [ ]  Blood in stool, [ ]  Gastroesophageal Reflux/heartburn, Urinary: [x chronic Kidney disease, [ ]  on HD - [ ]  MWF or [ ]  TTHS, [ ]  Burning with urination, [ ]  Difficulty urinating Skin: [ ]  Rashes, [ ]  Wounds Psychological: [ ]  Anxiety, [ ]  Depression  Physical Examination Vitals:   12/14/16 0200 12/14/16 0215 12/14/16 0526 12/14/16 0557  BP: (!) 164/78 (!) 162/77  (!) 154/79 (!) 168/76  Pulse: 67 66 67 68  Resp: 16 16 14 16   Temp: 97.8 F (36.6 C) 97.7 F (36.5 C) 97.7 F (36.5 C) 97.8 F (36.6 C)  TempSrc: Oral Oral Oral Oral  SpO2:  100% 100% 100%  Weight:      Height:       Body mass index is 57.56 kg/m.  General:  WDWN in NAD HENT: WNL Eyes: Pupils equal Pulmonary: normal non-labored breathing , without Rales, rhonchi,  wheezing Cardiac: RRR, without  Murmurs, rubs or gallops; No carotid bruits Abdomen: soft, NT, no masses Skin: no rashes, ulcers noted;  no Gangrene , no cellulitis; no open wounds;  Vascular Exam/Pulses:[place] radial brachial Pulses B Musculoskeletal: no muscle wasting or atrophy; B LE edema  Neurologic: A&O X 3; Appropriate Affect ; SENSATION: normal; MOTOR FUNCTION: 5/5 Symmetric; Speech is fluent/normal Psychiatric: Judgment intact, Mood & affect appropriate for pt's clinical situation Lymph : No Cervical, Axillary, or Inguinal lymphadenopathy     Significant Diagnostic Studies: CBC Lab Results  Component Value Date   WBC 4.9 12/13/2016   HGB 4.1 (LL) 12/13/2016   HCT 12.6 (L) 12/13/2016   MCV 77.3 (L) 12/13/2016   PLT 200 12/13/2016    BMET    Component Value Date/Time   NA 135 12/13/2016 1813   K 4.7 12/13/2016 1813   CL 112 (H) 12/13/2016 1813   CO2 11 (L) 12/13/2016 1813   GLUCOSE 78 12/13/2016 1813   BUN 98 (H) 12/13/2016 1813   CREATININE 13.73 (H) 12/13/2016 1813   CALCIUM 5.8 (LL) 12/13/2016 1813   GFRNONAA 3 (L) 12/13/2016 1813   GFRAA 4 (L) 12/13/2016 1813   Estimated Creatinine Clearance: 7.4 mL/min (A) (by C-G formula based on SCr of 13.73 mg/dL (H)).  COAG Lab Results  Component Value Date   INR 1.33 12/13/2016      ASSESSMENT/PLAN:  ESRD, Severe Anemia - asked to place a TDC.   - receiving PRBC currently.   -  He will need permanent access in the future. He is right handed.  [facility] [place] [facility] [place]   [name] Dudley 12/14/2016 9:13 AM     Addendum  I have independently interviewed and examined the patient, and I agree with the physician assistant's findings.  Upon entering room, pRBC sitting  patient's chair, so obvious have not been given.  Pt will not be candidate for any procedure until medically stable.  Pt is scheduled for tomorrow for [facility] placement.   The patient is aware the risks of tunneled dialysis catheter placement include but are not limited to: bleeding, infection, central venous injury, pneumothorax, possible venous stenosis, possible malpositioning in the venous system, and possible infections related to long-term catheter presence.   Permanent access pending vein mapping: likely later this coming week.  The patient was aware of these risks and agreed to proceed.   Leonides Sake, MD, FACS [facility] Office: [phone] Pager: [phone]  12/14/2016, 12:47 PM

## 2016-12-14 NOTE — Progress Notes (Signed)
CRITICAL VALUE ALERT  Critical Value:  Hemoglobin 6.5, Calcium 6.3  Date & Time Notied:  12/14/16  Provider Notified: Dr. Isidoro Donningai  Orders Received/Actions taken: Will continue to monitor. Bess KindsGWALTNEY, Paige Vanderwoude B, RN

## 2016-12-14 NOTE — Progress Notes (Signed)
BP elevated. Hydralazine 5 mg IV administered per prn order. Will continue to monitor. Bess KindsGWALTNEY, Torrin Frein B, RN   12/14/16 1542 12/14/16 1604  Vitals  BP (!) 184/88 (!) 170/80  BP Location --  Right Arm  BP Method --  Automatic  Patient Position (if appropriate) --  Lying  Pulse Rate 68 63

## 2016-12-14 NOTE — ED Notes (Signed)
Waited for US to be done to bring patient up to the floor.  They never came.  When we called them they said it would be a while longer and that I could take the patient up and they would do it on the floor.  First unit of blood complete.  Tolerated well.  No s/s of reaction noted.

## 2016-12-14 NOTE — Consult Note (Addendum)
CONSULT NOTE   MRN : [id number]  Reason for Consult: [facility] Referring Physician: Dr. Marisue HumbleSanford  History of Present Illness:  [name] Dudley is a 71 y.o. ([birth year]) male  Present cc: SOB and leg edema.  Pt has known Acute on CKD.  Despite being told he would likely need HD 3 months ago, reportedly pt declined to pursue permanent access placement.  The patient denies prior central venous catheters or permanent pacemakers.  Current Facility-Administered Medications  Medication Dose Route Frequency Provider Last Rate Last Dose  . acetaminophen (TYLENOL) tablet 650 mg  650 mg Oral Q6H PRN Lorretta HarpNiu, Xilin, MD      . calcitRIOL (ROCALTROL) capsule 1 mcg  1 mcg Oral Daily Sabra HeckSanford, Ryan B, MD      . calcium carbonate (TUMS - dosed in mg elemental calcium) chewable tablet 800 mg of elemental calcium  800 mg of elemental calcium Oral BID Sabra HeckSanford, Ryan B, MD      . cholecalciferol (VITAMIN D) tablet 1,000 Units  1,000 Units Oral Daily Lorretta HarpNiu, Xilin, MD      . cloNIDine (CATAPRES) tablet 0.2 mg  0.2 mg Oral BID Lorretta HarpNiu, Xilin, MD   0.2 mg at 12/14/16 0236  . diazepam (VALIUM) tablet 1 mg  1 mg Oral QHS Lorretta HarpNiu, Xilin, MD   1 mg at 12/14/16 [id number]  . furosemide (LASIX) 120 mg in dextrose 5 % 50 mL IVPB  120 mg Intravenous BID Lorretta HarpNiu, Xilin, MD      . heparin injection 5,000 Units  5,000 Units Subcutaneous Q8H Lorretta HarpNiu, Xilin, MD      . hydrALAZINE (APRESOLINE) injection 5 mg  5 mg Intravenous Q2H PRN Lorretta HarpNiu, Xilin, MD   5 mg at 12/13/16 2249  . insulin aspart (novoLOG) injection 0-5 Units  0-5 Units Subcutaneous QHS Lorretta HarpNiu, Xilin, MD      . insulin aspart (novoLOG) injection 0-9 Units  0-9 Units Subcutaneous TID WC Lorretta HarpNiu, Xilin, MD      . ondansetron [facility](ZOFRAN) injection 4 mg  4 mg Intravenous Q8H PRN Lorretta HarpNiu, Xilin, MD      . polyethylene glycol (MIRALAX / GLYCOLAX) packet 17 g  17 g Oral Daily PRN Lorretta HarpNiu, Xilin, MD      . terazosin (HYTRIN) capsule 1 mg  1 mg Oral QHS Lorretta HarpNiu, Xilin, MD   1 mg at 12/14/16 [id number]  . zolpidem  (AMBIEN) tablet 5 mg  5 mg Oral QHS PRN Lorretta HarpNiu, Xilin, MD        Pt meds include: Statin :No Betablocker: No ASA: No Other anticoagulants/antiplatelets: none  Past Medical History:  Diagnosis Date  . Diabetes mellitus without complication (HCC)   . Hypertension   . Neuropathy   . Renal disorder     Past Surgical History: none  Social History Social History   Tobacco Use  . Smoking status: Unknown If Ever Smoked  . Smokeless tobacco: Never Used  Substance Use Topics  . Alcohol use: No    Frequency: Never  . Drug use: Not on file    Family History Family History  Problem Relation Age of Onset  . Cancer Mother        Patient does not know which type of cancer  . Hypertension Father     No Known Allergies   REVIEW OF SYSTEMS  General: [ ]  Weight loss, [ ]  Fever, [ ]  chills Neurologic: [ ]  Dizziness, [ ]  Blackouts, [ ]  Seizure [ ]  Stroke, [ ]  "Mini stroke", [ ]  Slurred speech, [ ]   Temporary blindness; [ ]  weakness in arms or legs, [ ]  Hoarseness [ ]  Dysphagia Cardiac: [ ]  Chest pain/pressure, [ ]  Shortness of breath at rest [x ] Shortness of breath with exertion, [ ]  Atrial fibrillation or irregular heartbeat  Vascular: [ ]  Pain in legs with walking, [ ]  Pain in legs at rest, [ ]  Pain in legs at night,  [ ]  Non-healing ulcer, [ ]  Blood clot in vein/DVT,   Pulmonary: [ ]  Home oxygen, [ ]  Productive cough, [ ]  Coughing up blood, [ ]  Asthma,  [ ]  Wheezing [ ]  COPD Musculoskeletal:  [ ]  Arthritis, [ ]  Low back pain, [ ]  Joint pain Hematologic: [ ]  Easy Bruising, [ ]  Anemia; [ ]  Hepatitis Gastrointestinal: [ ]  Blood in stool, [ ]  Gastroesophageal Reflux/heartburn, Urinary: [x chronic Kidney disease, [ ]  on HD - [ ]  MWF or [ ]  TTHS, [ ]  Burning with urination, [ ]  Difficulty urinating Skin: [ ]  Rashes, [ ]  Wounds Psychological: [ ]  Anxiety, [ ]  Depression  Physical Examination Vitals:   12/14/16 0200 12/14/16 0215 12/14/16 0526 12/14/16 0557  BP: (!) 164/78 (!) 162/77  (!) 154/79 (!) 168/76  Pulse: 67 66 67 68  Resp: 16 16 14 16   Temp: 97.8 F (36.6 C) 97.7 F (36.5 C) 97.7 F (36.5 C) 97.8 F (36.6 C)  TempSrc: Oral Oral Oral Oral  SpO2:  100% 100% 100%  Weight:      Height:       Body mass index is 57.56 kg/m.  General:  WDWN in NAD HENT: WNL Eyes: Pupils equal Pulmonary: normal non-labored breathing , without Rales, rhonchi,  wheezing Cardiac: RRR, without  Murmurs, rubs or gallops; No carotid bruits Abdomen: soft, NT, no masses Skin: no rashes, ulcers noted;  no Gangrene , no cellulitis; no open wounds;  Vascular Exam/Pulses:[place] radial brachial Pulses B Musculoskeletal: no muscle wasting or atrophy; B LE edema  Neurologic: A&O X 3; Appropriate Affect ; SENSATION: normal; MOTOR FUNCTION: 5/5 Symmetric; Speech is fluent/normal Psychiatric: Judgment intact, Mood & affect appropriate for pt's clinical situation Lymph : No Cervical, Axillary, or Inguinal lymphadenopathy     Significant Diagnostic Studies: CBC Lab Results  Component Value Date   WBC 4.9 12/13/2016   HGB 4.1 (LL) 12/13/2016   HCT 12.6 (L) 12/13/2016   MCV 77.3 (L) 12/13/2016   PLT 200 12/13/2016    BMET    Component Value Date/Time   NA 135 12/13/2016 1813   K 4.7 12/13/2016 1813   CL 112 (H) 12/13/2016 1813   CO2 11 (L) 12/13/2016 1813   GLUCOSE 78 12/13/2016 1813   BUN 98 (H) 12/13/2016 1813   CREATININE 13.73 (H) 12/13/2016 1813   CALCIUM 5.8 (LL) 12/13/2016 1813   GFRNONAA 3 (L) 12/13/2016 1813   GFRAA 4 (L) 12/13/2016 1813   Estimated Creatinine Clearance: 7.4 mL/min (A) (by C-G formula based on SCr of 13.73 mg/dL (H)).  COAG Lab Results  Component Value Date   INR 1.33 12/13/2016      ASSESSMENT/PLAN:  ESRD, Severe Anemia - asked to place a TDC.   - receiving PRBC currently.   -  He will need permanent access in the future. He is right handed.  [facility] [place] [facility] [place] [facility] [place].   Mosetta Pigeonmma Maureen Collins 12/14/2016 9:13 AM     Addendum  I have independently interviewed and examined the patient, and I agree with the physician assistant's findings.  Upon entering room, pRBC sitting  patient's chair, so obvious have not been given.  Pt will not be candidate for any procedure until medically stable.  Pt is scheduled for tomorrow for [facility] placement.   The patient is aware the risks of tunneled dialysis catheter placement include but are not limited to: bleeding, infection, central venous injury, pneumothorax, possible venous stenosis, possible malpositioning in the venous system, and possible infections related to long-term catheter presence.   Permanent access pending vein mapping: likely later this coming week.  The patient was aware of these risks and agreed to proceed.   Leonides Sake, MD, FACS [facility] Office: [phone] Pager: [phone]  12/14/2016, 12:47 PM

## 2016-12-14 NOTE — Progress Notes (Signed)
[facility]                                                                              Patient Demographics  [name] Dudley, is a 71 y.o. male, DOB - [birth year], ZOX:[id number]  Admit date - 12/13/2016   Admitting Physician Lorretta HarpXilin Niu, MD  [facility]  Outpatient specialists:   LOS - 1  days   Medical records reviewed and are as summarized below:    Chief Complaint  Patient presents with  . Leg Swelling       Brief summary   Patient is a 71 year old male with CKD stage IV, hypertension, diabetes, tobacco abuse, BPH presented with shortness of breath, peripheral edema.  Patient follows nephrology encouraged nurse will be hospital and was recommended dialysis but he had opted not to.  Patient presented with shortness of breath, progressively getting worse, worsening bilateral leg edema, found to have hemoglobin of 4.1.  Patient was admitted for further workup.    Assessment & Plan    Principal Problem:   Anemia due to end stage renal disease (HCC) -Hemoglobin 4.1 at the time of admission, FOBT negative, likely due to anemia of renal disease -Currently receiving transfusion, follow-up CBC -Anemia profile consistent with anemia of chronic disease, follow SPEP, start ESA with hemodialysis  Active Problems: CKD stage IV, now end-stage renal disease, peripheral edema, dyspnea -Nephrology and vascular surgery consulted, -Renal ultrasound showed no evidence of hydronephrosis, increased renal parenchymal echogenicity -Nephrology starting hemodialysis today, vascular surgery also following for access - will need CLIP  - Follow 2D echo     Hypertension -Currently elevated, on clonidine, hydralazine as needed, will likely improve after hemodialysis     Hypocalcemia -  check phosphorus, PTH, -Per nephrology    BPH (benign prostatic hyperplasia) -Continue terazosin    Type II diabetes mellitus with renal  manifestations (HCC) -Continue sliding scale insulin, follow hemoglobin A1c  Code Status: full  DVT Prophylaxis: Heparin subcu Family Communication: Discussed in detail with the patient, all imaging results, lab results explained to the patient  Disposition Plan:  Time Spent in minutes  25 minutes  Procedures:    Consultants:   Nephrology Vascular surgery  Antimicrobials:      Medications  Scheduled Meds: . calcitRIOL  1 mcg Oral Daily  . calcium carbonate  800 mg of elemental calcium Oral BID  . cholecalciferol  1,000 Units Oral Daily  . cloNIDine  0.2 mg Oral BID  . diazepam  1 mg Oral QHS  . heparin  5,000 Units Subcutaneous Q8H  . insulin aspart  0-5 Units Subcutaneous QHS  . insulin aspart  0-9 Units Subcutaneous TID WC  . terazosin  1 mg Oral QHS   Continuous Infusions: . furosemide 120 mg (12/14/16 1047)   PRN Meds:.acetaminophen, hydrALAZINE, ondansetron (ZOFRAN) IV, polyethylene glycol, zolpidem   Antibiotics   Anti-infectives (From admission, onward)   None        Subjective:   [name] LemonsSylvester Paolo was seen and examined today.  States shortness of breath is improving. BP uncontrolled. Patient denies dizziness, chest pain, abdominal pain, N/V/D/C,  new weakness, numbess, tingling.   Objective:   Vitals:   12/14/16 0526 12/14/16 0557 12/14/16 0950 12/14/16 1013  BP: (!) 154/79 (!) 168/76 (!) 174/84 (!) 181/94  Pulse: 67 68 72 71  Resp: 14 16 16 16   Temp: 97.7 F (36.5 C) 97.8 F (36.6 C) 98.6 F (37 C) 98.1 F (36.7 C)  TempSrc: Oral Oral Oral Oral  SpO2: 100% 100% 100% 98%  Weight:      Height:        Intake/Output Summary (Last 24 hours) at 12/14/2016 1052 Last data filed at 12/14/2016 1018 Gross per 24 hour  Intake 5097 ml  Output 475 ml  Net 4622 ml     Wt Readings from Last 3 Encounters:  12/14/16 (!) 166.7 kg (367 lb 8.1 oz)     Exam  General: Alert and oriented, NAD  Eyes: PERRLA, EOMI  HEENT:    Cardiovascular: S1  S2 auscultated, no rubs, murmurs or gallops. Regular rate and rhythm.  Respiratory: dec BS at bases  Gastrointestinal: Soft, nontender, nondistended, + bowel sounds  Ext: 3+ pedal edema bilaterally  Neuro: AAOx3, Cr N's II- XII. Strength 5/5 upper and lower extremities bilaterally  Musculoskeletal: No digital cyanosis, clubbing  Skin: No rashes  Psych: alert and awake    Data Reviewed:  I have personally reviewed following labs and imaging studies  Micro Results Recent Results (from the past 240 hour(s))  MRSA PCR Screening     Status: None   Collection Time: 12/14/16  3:22 AM  Result Value Ref Range Status   MRSA by PCR NEGATIVE NEGATIVE Final    Comment:        The GeneXpert MRSA Assay (FDA approved for NASAL specimens only), is one component of a comprehensive MRSA colonization surveillance program. It is not intended to diagnose MRSA infection nor to guide or monitor treatment for MRSA infections.     Radiology Reports US Renal  Result Date: 12/14/2016 CLINICAL DATA:  Acute onset of renal insufficiency. EXAM: RENAL / URINARY TRACT ULTRASOUND COMPLETE COMPARISON:  None. FINDINGS: Right Kidney: Length: 8.5 cm. Increased parenchymal echogenicity is noted. A 0.5 cm cyst is noted at the upper pole of the right kidney. No hydronephrosis visualized. Left Kidney: Length: 8.5 cm. Increased parenchymal echogenicity is noted. A 0.7 cm cyst is noted at the upper pole of the left kidney. No hydronephrosis visualized. Bladder: Appears normal for degree of bladder distention. Small bilateral pleural effusions are noted. IMPRESSION: 1. No evidence of hydronephrosis. 2. Increased renal parenchymal echogenicity raises concern for medical renal disease. 3. Tiny bilateral renal cysts seen. 4. Small bilateral pleural effusions noted. Electronically Signed   By: Roanna Raider M.D.   On: 12/14/2016 03:36   Dg Chest Port 1 View  Result Date: 12/13/2016 CLINICAL DATA:  71 year old male  with fluid overload. EXAM: PORTABLE CHEST 1 VIEW COMPARISON:  None. FINDINGS: There small bilateral pleural effusions and minimal associated bibasilar compressive atelectasis. Pneumonia is not excluded. Clinical correlation is recommended. No pneumothorax. Borderline cardiomegaly with mild vascular congestion. No acute osseous pathology. IMPRESSION: Borderline cardiomegaly with mild vascular congestion and small pleural effusions. Electronically Signed   By: Elgie Collard M.D.   On: 12/13/2016 22:33    Lab Data:  CBC: Recent Labs  Lab 12/13/16 1813  WBC 4.9  HGB 4.1*  HCT 12.6*  MCV 77.3*  PLT 200   Basic Metabolic Panel: Recent Labs  Lab 12/13/16 1813 12/14/16 0009  NA 135  --   K  4.7  --   CL 112*  --   CO2 11*  --   GLUCOSE 78  --   BUN 98*  --   CREATININE 13.73*  --   CALCIUM 5.8*  --   PHOS  --  9.3*   GFR: Estimated Creatinine Clearance: 7.4 mL/min (A) (by C-G formula based on SCr of 13.73 mg/dL (H)). Liver Function Tests: Recent Labs  Lab 12/14/16 0009  ALBUMIN 2.6*   No results for input(s): LIPASE, AMYLASE in the last 168 hours. No results for input(s): AMMONIA in the last 168 hours. Coagulation Profile: Recent Labs  Lab 12/13/16 2116  INR 1.33   Cardiac Enzymes: No results for input(s): CKTOTAL, CKMB, CKMBINDEX, TROPONINI in the last 168 hours. BNP (last 3 results) No results for input(s): PROBNP in the last 8760 hours. HbA1C: No results for input(s): HGBA1C in the last 72 hours. CBG: Recent Labs  Lab 12/14/16 0131 12/14/16 0747  GLUCAP 108* 95   Lipid Profile: No results for input(s): CHOL, HDL, LDLCALC, TRIG, CHOLHDL, LDLDIRECT in the last 72 hours. Thyroid Function Tests: No results for input(s): TSH, T4TOTAL, FREET4, T3FREE, THYROIDAB in the last 72 hours. Anemia Panel: Recent Labs    12/13/16 2230  VITAMINB12 727  FOLATE 13.0  FERRITIN 309  TIBC 196*  IRON 106  RETICCTPCT 1.6   Urine analysis:    Component Value Date/Time     COLORURINE STRAW (A) 12/13/2016 1910   APPEARANCEUR CLEAR 12/13/2016 1910   LABSPEC 1.008 12/13/2016 1910   PHURINE 6.0 12/13/2016 1910   GLUCOSEU 50 (A) 12/13/2016 1910   HGBUR MODERATE (A) 12/13/2016 1910   BILIRUBINUR NEGATIVE 12/13/2016 1910   KETONESUR NEGATIVE 12/13/2016 1910   PROTEINUR 100 (A) 12/13/2016 1910   UROBILINOGEN 0.2 07/16/2008 0820   NITRITE NEGATIVE 12/13/2016 1910   LEUKOCYTESUR NEGATIVE 12/13/2016 1910     Wava Kildow M.D. [facility] 12/14/2016, 10:52 AM  Pager: [phone] Between 7am to 7pm - call Pager - [phone]  After 7pm go to [email or web address] - password [email or web address]  Call night coverage person covering after 7pm

## 2016-12-14 NOTE — Progress Notes (Signed)
New Admission Note:  Arrival Method: By bed from ED around 0150 Mental Orientation: Alert and oriented Telemetry: Box 7, CCMD notified Assessment: Completed Skin: Completed, refer to flowsheets IV: Right hand, Left AC Pain: Denies Tubes: None Safety Measures: Safety Fall Prevention Plan was given, discussed  Admission: Completed 75M Orientation: Patient has been orientated to the room, unit and the staff. Family: None  Orders have been reviewed and implemented. Will continue to monitor the patient. Call light has been placed within reach and bed alarm has been activated.   Alfonse Rashristy Berenice Oehlert, RN  Phone Number: [phone]

## 2016-12-14 NOTE — Progress Notes (Signed)
Admit: 12/13/2016 LOS: 1  72M with new ESRD, severe anemia.    Subjective:  3units pRBC overnight  Pt still hesitantly accepting of HD  11/16 0701 - 11/17 0700 In: 4325 [Blood:4225; IV Piggyback:100] Out: 300 [Urine:300]  Filed Weights   12/14/16 0135  Weight: (!) 166.7 kg (367 lb 8.1 oz)    Scheduled Meds: . calcitRIOL  1 mcg Oral Daily  . calcium carbonate  800 mg of elemental calcium Oral BID  . cholecalciferol  1,000 Units Oral Daily  . cloNIDine  0.2 mg Oral BID  . diazepam  1 mg Oral QHS  . heparin  5,000 Units Subcutaneous Q8H  . insulin aspart  0-5 Units Subcutaneous QHS  . insulin aspart  0-9 Units Subcutaneous TID WC  . terazosin  1 mg Oral QHS   Continuous Infusions: . furosemide     PRN Meds:.acetaminophen, hydrALAZINE, ondansetron (ZOFRAN) IV, polyethylene glycol, zolpidem  Current Labs: reviewed 11/17 Renal US 8.5cm b/l with medical renal disease, no obstruction   Physical Exam:  Blood pressure (!) 168/76, pulse 68, temperature 97.8 F (36.6 C), temperature source Oral, resp. rate 16, height 5\' 7"  (1.702 m), weight (!) 166.7 kg (367 lb 8.1 oz), SpO2 100 %. GEN: NAD ENT: NCaT EYES: EOMI CV: No rub, nl s1s2, RRR PULM: ctab ,no wrr ABD: s/nt/nd, no suprapubic tenderness SKIN: yellow tint to skin EXT: 3+ pitting LEE into prox legs    Assessment/Plan 72M with new ESRD, severe anemia.    1. New ESRD.  Pt agreeable to start iHD.   1. Need TDC and AVF eval, VVS to eval today 2. HD #1 tentative tomorrow after TDC: very gentle first Tx with high ca bath 3. [facility] need [facility] CLIP patient[facility] -- [facility] has concerns with [facility] and financing  4. Renal US consistent with ESRD 2. Severe anemia 1. rec 3u pRBC transfusion at HD 2. TSAT 54% and Ferritin 309; B12 replete; likely all related to CKD/ESRD 3. Will start ESA with HD 4. SPEP and sFLC given age + renal failure pending 3. Hypocalcemia; CKD-BMD: Check P and PTH; start VDRA; high Ca bath with HD;  check albumin; Tums BID not as binder 4. Hypervolemia / LEE: will address with HD 5. HTN 6. DM2    [name] Heckyan Copper Basnett MD 12/14/2016, 9:39 AM  Recent Labs  Lab 12/13/16 1813 12/14/16 0009  NA 135  --   K 4.7  --   CL 112*  --   CO2 11*  --   GLUCOSE 78  --   BUN 98*  --   CREATININE 13.73*  --   CALCIUM 5.8*  --   PHOS  --  9.3*   Recent Labs  Lab 12/13/16 1813  WBC 4.9  HGB 4.1*  HCT 12.6*  MCV 77.3*  PLT 200

## 2016-12-15 ENCOUNTER — Inpatient Hospital Stay (HOSPITAL_COMMUNITY): Payer: Non-veteran care

## 2016-12-15 ENCOUNTER — Encounter (HOSPITAL_COMMUNITY): Payer: Self-pay | Admitting: Certified Registered"

## 2016-12-15 ENCOUNTER — Encounter (HOSPITAL_COMMUNITY)
Admission: EM | Discharge status: Against Medical Advice | Payer: Self-pay | Source: Home / Self Care | Attending: Internal Medicine

## 2016-12-15 ENCOUNTER — Inpatient Hospital Stay (HOSPITAL_COMMUNITY): Payer: Non-veteran care | Admitting: Certified Registered"

## 2016-12-15 HISTORY — PX: INSERTION OF DIALYSIS CATHETER: SHX1324

## 2016-12-15 LAB — BASIC METABOLIC PANEL
ANION GAP: 12 (ref 5–15)
ANION GAP: 12 (ref 5–15)
BUN: 100 mg/dL — ABNORMAL HIGH (ref 6–20)
BUN: 101 mg/dL — ABNORMAL HIGH (ref 6–20)
CHLORIDE: 110 mmol/L (ref 101–111)
CHLORIDE: 112 mmol/L — AB (ref 101–111)
CO2: 12 mmol/L — ABNORMAL LOW (ref 22–32)
CO2: 13 mmol/L — AB (ref 22–32)
Calcium: 6.5 mg/dL — ABNORMAL LOW (ref 8.9–10.3)
Calcium: 6.6 mg/dL — ABNORMAL LOW (ref 8.9–10.3)
Creatinine, Ser: 13.11 mg/dL — ABNORMAL HIGH (ref 0.61–1.24)
Creatinine, Ser: 13.1 mg/dL — ABNORMAL HIGH (ref 0.61–1.24)
GFR calc non Af Amer: 3 mL/min — ABNORMAL LOW (ref >60)
GFR, EST AFRICAN AMERICAN: 4 mL/min — AB (ref >60)
GFR, EST AFRICAN AMERICAN: 4 mL/min — AB (ref >60)
GFR, EST NON AFRICAN AMERICAN: 3 mL/min — AB (ref >60)
GLUCOSE: 99 mg/dL (ref 65–99)
Glucose, Bld: 80 mg/dL (ref 65–99)
POTASSIUM: 4.7 mmol/L (ref 3.5–5.1)
Potassium: 4.5 mmol/L (ref 3.5–5.1)
SODIUM: 136 mmol/L (ref 135–145)
Sodium: 135 mmol/L (ref 135–145)

## 2016-12-15 LAB — CBC
HCT: 22.5 % — ABNORMAL LOW (ref 39.0–52.0)
Hemoglobin: 7.7 g/dL — ABNORMAL LOW (ref 13.0–17.0)
MCH: 27.7 pg (ref 26.0–34.0)
MCHC: 34.2 g/dL (ref 30.0–36.0)
MCV: 80.9 fL (ref 78.0–100.0)
Platelets: 187 10*3/uL (ref 150–400)
RBC: 2.78 MIL/uL — AB (ref 4.22–5.81)
RDW: 16.5 % — ABNORMAL HIGH (ref 11.5–15.5)
WBC: 5.9 10*3/uL (ref 4.0–10.5)

## 2016-12-15 LAB — GLUCOSE, CAPILLARY
GLUCOSE-CAPILLARY: 156 mg/dL — AB (ref 65–99)
Glucose-Capillary: 152 mg/dL — ABNORMAL HIGH (ref 65–99)
Glucose-Capillary: 67 mg/dL (ref 65–99)
Glucose-Capillary: 82 mg/dL (ref 65–99)
Glucose-Capillary: 142 mg/dL — ABNORMAL HIGH (ref 65–99)
Glucose-Capillary: 115 mg/dL — ABNORMAL HIGH (ref 65–99)

## 2016-12-15 LAB — HEMOGLOBIN A1C
HEMOGLOBIN A1C: 5.2 % (ref 4.8–5.6)
MEAN PLASMA GLUCOSE: 102.54 mg/dL

## 2016-12-15 LAB — SURGICAL PCR SCREEN
MRSA, PCR: NEGATIVE
Staphylococcus aureus: NEGATIVE

## 2016-12-15 LAB — PARATHYROID HORMONE, INTACT (NO CA): PTH: 737 pg/mL — ABNORMAL HIGH (ref 15–65)

## 2016-12-15 LAB — ALBUMIN: ALBUMIN: 2.5 g/dL — AB (ref 3.5–5.0)

## 2016-12-15 SURGERY — INSERTION OF DIALYSIS CATHETER
Anesthesia: General | Laterality: Right

## 2016-12-15 MED ORDER — SODIUM CHLORIDE 0.9 % IV SOLN: 100.0000 mL | INTRAVENOUS | Status: DC | PRN

## 2016-12-15 MED ORDER — HYDRALAZINE HCL 20 MG/ML IJ SOLN
10.0000 mg | INTRAMUSCULAR | Status: DC | PRN
  Administered 2016-12-15 – 2016-12-19 (×4): 10 mg via INTRAVENOUS
  Filled 2016-12-15 (×5): qty 1

## 2016-12-15 MED ORDER — ALTEPLASE 2 MG IJ SOLR: 2.0000 mg | Freq: Once | INTRAMUSCULAR | Status: DC | PRN

## 2016-12-15 MED ORDER — ONDANSETRON HCL 4 MG/2ML IJ SOLN
INTRAMUSCULAR | Status: DC | PRN
  Administered 2016-12-15: 4 mg via INTRAVENOUS

## 2016-12-15 MED ORDER — FENTANYL CITRATE (PF) 100 MCG/2ML IJ SOLN: 25.0000 ug | INTRAMUSCULAR | Status: DC | PRN

## 2016-12-15 MED ORDER — HEPARIN SODIUM (PORCINE) 1000 UNIT/ML IJ SOLN
INTRAMUSCULAR | Status: AC
  Filled 2016-12-15: qty 1

## 2016-12-15 MED ORDER — DEXTROSE 50 % IV SOLN
INTRAVENOUS | Status: AC
  Administered 2016-12-15: 50 mL
  Filled 2016-12-15: qty 50

## 2016-12-15 MED ORDER — FENTANYL CITRATE (PF) 250 MCG/5ML IJ SOLN
INTRAMUSCULAR | Status: AC
  Filled 2016-12-15: qty 5

## 2016-12-15 MED ORDER — PROMETHAZINE HCL 25 MG/ML IJ SOLN: 6.2500 mg | INTRAMUSCULAR | Status: DC | PRN

## 2016-12-15 MED ORDER — CALCIUM CARBONATE ANTACID 500 MG PO CHEW
800.0000 mg | CHEWABLE_TABLET | Freq: Three times a day (TID) | ORAL | Status: DC
  Administered 2016-12-15 – 2016-12-21 (×13): 800 mg via ORAL
  Filled 2016-12-15 (×13): qty 4

## 2016-12-15 MED ORDER — DARBEPOETIN ALFA 100 MCG/0.5ML IJ SOSY
PREFILLED_SYRINGE | INTRAMUSCULAR | Status: AC
  Filled 2016-12-15: qty 0.5

## 2016-12-15 MED ORDER — ONDANSETRON HCL 4 MG/2ML IJ SOLN
INTRAMUSCULAR | Status: AC
  Filled 2016-12-15: qty 2

## 2016-12-15 MED ORDER — HEPARIN SODIUM (PORCINE) 1000 UNIT/ML DIALYSIS: 1000.0000 [IU] | INTRAMUSCULAR | Status: DC | PRN

## 2016-12-15 MED ORDER — PROPOFOL 10 MG/ML IV BOLUS
INTRAVENOUS | Status: DC | PRN
  Administered 2016-12-15: 130 mg via INTRAVENOUS

## 2016-12-15 MED ORDER — DEXTROSE 5 % IV SOLN
INTRAVENOUS | Status: DC | PRN
  Administered 2016-12-15: 1.5 g via INTRAVENOUS

## 2016-12-15 MED ORDER — PROPOFOL 10 MG/ML IV BOLUS
INTRAVENOUS | Status: AC
Start: 2016-12-15 — End: 2016-12-15
  Filled 2016-12-15: qty 20

## 2016-12-15 MED ORDER — HEPARIN SODIUM (PORCINE) 5000 UNIT/ML IJ SOLN
INTRAMUSCULAR | Status: DC | PRN
  Administered 2016-12-15: 07:00:00

## 2016-12-15 MED ORDER — SODIUM CHLORIDE 0.9 % IV SOLN
INTRAVENOUS | Status: DC | PRN
  Administered 2016-12-15: 08:00:00 via INTRAVENOUS

## 2016-12-15 MED ORDER — HEPARIN SODIUM (PORCINE) 1000 UNIT/ML IJ SOLN
INTRAMUSCULAR | Status: DC | PRN
  Administered 2016-12-15: 1000 [IU]

## 2016-12-15 MED ORDER — LIDOCAINE 2% (20 MG/ML) 5 ML SYRINGE
INTRAMUSCULAR | Status: DC | PRN
  Administered 2016-12-15: 60 mg via INTRAVENOUS

## 2016-12-15 MED ORDER — 0.9 % SODIUM CHLORIDE (POUR BTL) OPTIME
TOPICAL | Status: DC | PRN
  Administered 2016-12-15: 1000 mL

## 2016-12-15 MED ORDER — DARBEPOETIN ALFA 100 MCG/0.5ML IJ SOSY
100.0000 ug | PREFILLED_SYRINGE | INTRAMUSCULAR | Status: DC
  Administered 2016-12-15: 100 ug via SUBCUTANEOUS
  Filled 2016-12-15: qty 0.5

## 2016-12-15 MED ORDER — DEXTROSE 5 % IV SOLN
INTRAVENOUS | Status: AC
  Filled 2016-12-15: qty 1.5

## 2016-12-15 MED ORDER — LIDOCAINE 2% (20 MG/ML) 5 ML SYRINGE
INTRAMUSCULAR | Status: AC
  Filled 2016-12-15: qty 5

## 2016-12-15 SURGICAL SUPPLY — 43 items
BAG DECANTER FOR FLEXI CONT (MISCELLANEOUS) ×3 IMPLANT
BIOPATCH RED 1 DISK 7.0 (GAUZE/BANDAGES/DRESSINGS) ×2 IMPLANT
BIOPATCH RED 1IN DISK 7.0MM (GAUZE/BANDAGES/DRESSINGS) ×1
CATH PALINDROME RT-P 15FX19CM (CATHETERS) IMPLANT
CATH PALINDROME RT-P 15FX23CM (CATHETERS) ×3 IMPLANT
CATH PALINDROME RT-P 15FX28CM (CATHETERS) IMPLANT
CATH PALINDROME RT-P 15FX55CM (CATHETERS) IMPLANT
CATH STRAIGHT 5FR 65CM (CATHETERS) IMPLANT
COVER PROBE W GEL 5X96 (DRAPES) ×3 IMPLANT
COVER SURGICAL LIGHT HANDLE (MISCELLANEOUS) ×3 IMPLANT
DECANTER SPIKE VIAL GLASS SM (MISCELLANEOUS) ×3 IMPLANT
DERMABOND ADVANCED (GAUZE/BANDAGES/DRESSINGS) ×2
DERMABOND ADVANCED .7 DNX12 (GAUZE/BANDAGES/DRESSINGS) ×1 IMPLANT
DRAPE C-ARM 42X72 X-RAY (DRAPES) ×3 IMPLANT
DRAPE CHEST BREAST 15X10 FENES (DRAPES) ×3 IMPLANT
GAUZE SPONGE 4X4 16PLY XRAY LF (GAUZE/BANDAGES/DRESSINGS) ×3 IMPLANT
GLOVE BIO SURGEON STRL SZ7 (GLOVE) ×3 IMPLANT
GLOVE BIOGEL PI IND STRL 7.0 (GLOVE) ×1 IMPLANT
GLOVE BIOGEL PI IND STRL 7.5 (GLOVE) ×3 IMPLANT
GLOVE BIOGEL PI INDICATOR 7.0 (GLOVE) ×2
GLOVE BIOGEL PI INDICATOR 7.5 (GLOVE) ×6
GLOVE SURG SS PI 7.5 STRL IVOR (GLOVE) ×3 IMPLANT
GOWN STRL REUS W/ TWL LRG LVL3 (GOWN DISPOSABLE) ×2 IMPLANT
GOWN STRL REUS W/TWL LRG LVL3 (GOWN DISPOSABLE) ×4
KIT BASIN OR (CUSTOM PROCEDURE TRAY) ×3 IMPLANT
KIT ROOM TURNOVER OR (KITS) ×3 IMPLANT
NEEDLE 18GX1X1/2 (RX/OR ONLY) (NEEDLE) ×3 IMPLANT
NEEDLE HYPO 25GX1X1/2 BEV (NEEDLE) ×3 IMPLANT
NS IRRIG 1000ML POUR BTL (IV SOLUTION) ×3 IMPLANT
PACK SURGICAL SETUP 50X90 (CUSTOM PROCEDURE TRAY) ×3 IMPLANT
PAD ARMBOARD 7.5X6 YLW CONV (MISCELLANEOUS) ×6 IMPLANT
SET MICROPUNCTURE 5F STIFF (MISCELLANEOUS) IMPLANT
SOAP 2 % CHG 4 OZ (WOUND CARE) ×3 IMPLANT
SUT ETHILON 3 0 PS 1 (SUTURE) ×3 IMPLANT
SUT MNCRL AB 4-0 PS2 18 (SUTURE) ×3 IMPLANT
SYR 10ML LL (SYRINGE) ×3 IMPLANT
SYR 20CC LL (SYRINGE) ×6 IMPLANT
SYR 3ML LL SCALE MARK (SYRINGE) ×3 IMPLANT
SYR 5ML LL (SYRINGE) ×3 IMPLANT
SYR CONTROL 10ML LL (SYRINGE) ×3 IMPLANT
TOWEL GREEN STERILE FF (TOWEL DISPOSABLE) ×3 IMPLANT
WATER STERILE IRR 1000ML POUR (IV SOLUTION) ×3 IMPLANT
WIRE AMPLATZ SS-J .035X180CM (WIRE) IMPLANT

## 2016-12-15 NOTE — Progress Notes (Signed)
   Daily Progress Note  Radiology     [place] Renal  Result Date: 12/14/2016 CLINICAL DATA:  Acute onset of renal insufficiency. EXAM: RENAL / URINARY TRACT ULTRASOUND COMPLETE COMPARISON:  None. FINDINGS: Right Kidney: Length: 8.5 cm. Increased parenchymal echogenicity is noted. A 0.5 cm cyst is noted at the upper pole of the right kidney. No hydronephrosis visualized. Left Kidney: Length: 8.5 cm. Increased parenchymal echogenicity is noted. A 0.7 cm cyst is noted at the upper pole of the left kidney. No hydronephrosis visualized. Bladder: Appears normal for degree of bladder distention. Small bilateral pleural effusions are noted. IMPRESSION: 1. No evidence of hydronephrosis. 2. Increased renal parenchymal echogenicity raises concern for medical renal disease. 3. Tiny bilateral renal cysts seen. 4. Small bilateral pleural effusions noted. Electronically Signed   By: Roanna RaiderJeffery  Chang M.D.   On: 12/14/2016 03:36   Dg Chest Port 1 View  Result Date: 12/15/2016 CLINICAL DATA:  Postop dialysis catheter placement EXAM: PORTABLE CHEST 1 VIEW COMPARISON:  12/13/2016 FINDINGS: Right side internal jugular dialysis catheter has been placed with the tip at the cavoatrial junction. Layering bilateral effusions. Vascular congestion and mild interstitial/ alveolar process in the lungs bilaterally, right greater than left, likely asymmetric edema. Heart is borderline in size. No pneumothorax. IMPRESSION: Right scratched at right dialysis catheter tip at the cavoatrial junction. No pneumothorax. Suspect mild asymmetric edema. Small layering bilateral effusions. Electronically Signed   By: Charlett NoseKevin  Dover M.D.   On: 12/15/2016 08:45   Dg Chest Port 1 View  Result Date: 12/13/2016 CLINICAL DATA:  71 year old male with fluid overload. EXAM: PORTABLE CHEST 1 VIEW COMPARISON:  None. FINDINGS: There small bilateral pleural effusions and minimal associated bibasilar compressive atelectasis. Pneumonia is not excluded. Clinical  correlation is recommended. No pneumothorax. Borderline cardiomegaly with mild vascular congestion. No acute osseous pathology. IMPRESSION: Borderline cardiomegaly with mild vascular congestion and small pleural effusions. Electronically Signed   By: Elgie CollardArash  Radparvar M.D.   On: 12/13/2016 22:33   Dg Fluoro Guide Cv Line-no Report  Result Date: 12/15/2016 Fluoroscopy was utilized by the requesting physician.  No radiographic interpretation.   - TDC being used for HD - Awaiting vein mapping to determine options - L cephalic vein appears a good candidate for AVF, so move PIV - Dr. Edilia Boickson will be covering for me   Leonides SakeBrian Merryn Thaker, MD, FACS [facility] [place] Office: [phone] Pager: [phone]  12/15/2016, 8:20 PM

## 2016-12-15 NOTE — Anesthesia Postprocedure Evaluation (Signed)
Anesthesia Post Note  Patient: [name] Dudley  Procedure(s) Performed: INSERTION OF TUNNEL DIALYSIS CATHETER - Right Internal Jugular (Right )     Patient location during evaluation: PACU Anesthesia Type: General Level of consciousness: awake and alert Pain management: pain level controlled Vital Signs Assessment: post-procedure vital signs reviewed and stable Respiratory status: spontaneous breathing, nonlabored ventilation, respiratory function stable and patient connected to nasal cannula oxygen Cardiovascular status: blood pressure returned to baseline and stable Postop Assessment: no apparent nausea or vomiting Anesthetic complications: no    Last Vitals:  Vitals:   12/15/16 0840 12/15/16 0845  BP: (!) 163/78   Pulse: 64 64  Resp: 10 14  Temp:    SpO2: 97% 98%    Last Pain:  Vitals:   12/15/16 0825  TempSrc:   PainSc: 0-No pain                 Isidora Laham S

## 2016-12-15 NOTE — Interval H&P Note (Signed)
History and Physical Interval Note:  12/15/2016 0740  [name] Dudley  has presented today for surgery, with the diagnosis of ACUTE KIDNEY INJURY  The various methods of treatment have been discussed with the patient and family. After consideration of risks, benefits and other options for treatment, the patient has consented to  Procedure(s): INSERTION OF TUNNEL DIALYSIS CATHETER - Right Internal Jugular (Right) as a surgical intervention .  The patient's history has been reviewed, patient examined, no change in status, stable for surgery.  I have reviewed the patient's chart and labs.  Questions were answered to the patient's satisfaction.     Leonides SakeBrian Hennie Gosa

## 2016-12-15 NOTE — Anesthesia Preprocedure Evaluation (Addendum)
Anesthesia Evaluation  Patient identified by MRN, date of birth, ID band Patient awake    Reviewed: Allergy & Precautions, NPO status , Patient's Chart, lab work & pertinent test results  Airway Mallampati: II  TM Distance: >3 FB Neck ROM: Full    Dental no notable dental hx.    Pulmonary neg pulmonary ROS,    breath sounds clear to auscultation (-) decreased breath sounds      Cardiovascular hypertension, Normal cardiovascular exam Rhythm:Regular Rate:Normal     Neuro/Psych negative neurological ROS  negative psych ROS   GI/Hepatic negative GI ROS, Neg liver ROS,   Endo/Other  diabetes  Renal/GU ESRFRenal disease  negative genitourinary   Musculoskeletal negative musculoskeletal ROS (+)   Abdominal (+) - obese,   Peds negative pediatric ROS (+)  Hematology  (+) anemia ,   Anesthesia Other Findings   Reproductive/Obstetrics negative OB ROS                            Anesthesia Physical Anesthesia Plan  ASA: III and emergent  Anesthesia Plan: General   Post-op Pain Management:    Induction: Intravenous  PONV Risk Score and Plan: 1 and Ondansetron and Treatment may vary due to age or medical condition  Airway Management Planned: LMA  Additional Equipment:   Intra-op Plan:   Post-operative Plan: Extubation in OR  Informed Consent: I have reviewed the patients History and Physical, chart, labs and discussed the procedure including the risks, benefits and alternatives for the proposed anesthesia with the patient or authorized representative who has indicated his/her understanding and acceptance.   Dental advisory given  Plan Discussed with: CRNA and Surgeon  Anesthesia Plan Comments:        Anesthesia Quick Evaluation

## 2016-12-15 NOTE — Progress Notes (Signed)
CXR completed

## 2016-12-15 NOTE — Progress Notes (Signed)
Paged Dr. Isidoro Donningai.  Pt is in dialysis, tele called pt having non-conductive PAC's causing 1.7 second pauses.  Paged Dr. Elon Jesterai FYI.  Advised tele pt is in dialysis.

## 2016-12-15 NOTE — Progress Notes (Signed)
[facility]                                                                              Patient Demographics  [name] LemonsSylvester Dudley, is a 71 y.o. male, DOB - Mar 08, 1945, WUJ:[id number]  Admit date - 12/13/2016   Admitting Physician [name] HarpXilin Niu, MD  [facility]  Outpatient specialists:   LOS - 2  days   Medical records reviewed and are as summarized below:    Chief Complaint  Patient presents with  . Leg Swelling       Brief summary   Patient is a 71 year old male with CKD stage IV, hypertension, diabetes, tobacco abuse, BPH presented with shortness of breath, peripheral edema.  Patient follows nephrology encouraged nurse will be hospital and was recommended dialysis but he had opted not to.  Patient presented with shortness of breath, progressively getting worse, worsening bilateral leg edema, found to have hemoglobin of 4.1.  Patient was admitted for further workup.    Assessment & Plan    Principal Problem:   Anemia due to end stage renal disease (HCC) -Hemoglobin 4.1 at the time of admission, FOBT negative, likely due to anemia of renal disease -Hemoglobin 7.7 after 4 units packed RBCs, -Anemia profile consistent with anemia of chronic disease, follow SPEP, start ESA with hemodialysis  Active Problems: CKD stage IV, now end-stage renal disease, peripheral edema, dyspnea -Nephrology and vascular surgery consulted, -Renal ultrasound showed no evidence of hydronephrosis, increased renal parenchymal echogenicity -TDC placed today, starting hemodialysis today, will need outpatient dialysis center    Hypertension -BP somewhat elevated, on clonidine, hydralazine as needed, likely will improve after HD      Hypocalcemia - per nephrology    BPH (benign prostatic hyperplasia) -Continue terazosin    Type II diabetes mellitus with renal manifestations (HCC) -Continue sliding scale insulin, follow hemoglobin  A1c  Code Status: full  DVT Prophylaxis: Heparin subcu Family Communication: Discussed in detail with the patient, all imaging results, lab results explained to the patient  Disposition Plan:  Time Spent in minutes  25 minutes  Procedures:    Consultants:   Nephrology Vascular surgery  Antimicrobials:      Medications  Scheduled Meds: . calcitRIOL  1 mcg Oral Daily  . calcium carbonate  800 mg of elemental calcium Oral TID WC  . cholecalciferol  1,000 Units Oral Daily  . cloNIDine  0.2 mg Oral BID  . darbepoetin (ARANESP) injection - DIALYSIS  100 mcg Intravenous Q Sat-HD  . diazepam  1 mg Oral QHS  . heparin  5,000 Units Subcutaneous Q8H  . insulin aspart  0-5 Units Subcutaneous QHS  . insulin aspart  0-9 Units Subcutaneous TID WC  . terazosin  1 mg Oral QHS   Continuous Infusions: . sodium chloride    . sodium chloride    . furosemide Stopped (12/14/16 2009)   PRN Meds:.sodium chloride, sodium chloride, acetaminophen, alteplase, heparin, hydrALAZINE, ondansetron (ZOFRAN) IV, polyethylene glycol, zolpidem   Antibiotics   Anti-infectives (From admission, onward)   Start     Dose/Rate Route Frequency Ordered Stop   12/15/16  1610  dextrose 5 % with cefUROXime (ZINACEF) ADS Med    Comments:  Dionne Bucy   : cabinet override      12/15/16 9604 12/15/16 0758        Subjective:   [name] Dudley was seen and examined today.  Denies any specific complaints, TDC placed today, pain controlled. Patient denies dizziness, chest pain, abdominal pain, N/V/D/C, new weakness, numbess, tingling.   Objective:   Vitals:   12/15/16 0845 12/15/16 0853 12/15/16 0858 12/15/16 1027  BP:  (!) 152/78  (!) 160/80  Pulse: 64 65  66  Resp: 14 10  16   Temp:  97.7 F (36.5 C)  98 F (36.7 C)  TempSrc:    Oral  SpO2: 98% 99%  98%  Weight:   75.3 kg (166 lb)   Height:        Intake/Output Summary (Last 24 hours) at 12/15/2016 1040 Last data filed at 12/15/2016  1000 Gross per 24 hour  Intake 1312 ml  Output 220 ml  Net 1092 ml     Wt Readings from Last 3 Encounters:  12/15/16 75.3 kg (166 lb)     Exam   General: Alert and oriented x 3, NAD  Eyes:   HEENT:  Atraumatic, normocephalic  Cardiovascular: S1 S2 auscultated, no rubs, murmurs or gallops. Regular rate and rhythm. No pedal edema b/l  Respiratory: Clear to auscultation bilaterally, no wheezing, rales or rhonchi, TDC in rt chestwall  Gastrointestinal: Soft, nontender, nondistended, + bowel sounds  Ext: no pedal edema bilaterally  Neuro: no neuro deficits  Musculoskeletal: No digital cyanosis, clubbing  Skin: No rashes  Psych: Normal affect and demeanor, alert and oriented x3    Data Reviewed:  I have personally reviewed following labs and imaging studies  Micro Results Recent Results (from the past 240 hour(s))  MRSA PCR Screening     Status: None   Collection Time: 12/14/16  3:22 AM  Result Value Ref Range Status   MRSA by PCR NEGATIVE NEGATIVE Final    Comment:        The GeneXpert MRSA Assay (FDA approved for NASAL specimens only), is one component of a comprehensive MRSA colonization surveillance program. It is not intended to diagnose MRSA infection nor to guide or monitor treatment for MRSA infections.   Surgical pcr screen     Status: None   Collection Time: 12/15/16  6:02 AM  Result Value Ref Range Status   MRSA, PCR NEGATIVE NEGATIVE Final   Staphylococcus aureus NEGATIVE NEGATIVE Final    Comment: (NOTE) The Xpert SA Assay (FDA approved for NASAL specimens in patients 22 years of age and older), is one component of a comprehensive surveillance program. It is not intended to diagnose infection nor to guide or monitor treatment.     Radiology Reports US Renal  Result Date: 12/14/2016 CLINICAL DATA:  Acute onset of renal insufficiency. EXAM: RENAL / URINARY TRACT ULTRASOUND COMPLETE COMPARISON:  None. FINDINGS: Right Kidney: Length: 8.5  cm. Increased parenchymal echogenicity is noted. A 0.5 cm cyst is noted at the upper pole of the right kidney. No hydronephrosis visualized. Left Kidney: Length: 8.5 cm. Increased parenchymal echogenicity is noted. A 0.7 cm cyst is noted at the upper pole of the left kidney. No hydronephrosis visualized. Bladder: Appears normal for degree of bladder distention. Small bilateral pleural effusions are noted. IMPRESSION: 1. No evidence of hydronephrosis. 2. Increased renal parenchymal echogenicity raises concern for medical renal disease. 3. Tiny bilateral renal cysts seen. 4.  Small bilateral pleural effusions noted. Electronically Signed   By: Roanna Raider M.D.   On: 12/14/2016 03:36   Dg Chest Port 1 View  Result Date: 12/15/2016 CLINICAL DATA:  Postop dialysis catheter placement EXAM: PORTABLE CHEST 1 VIEW COMPARISON:  12/13/2016 FINDINGS: Right side internal jugular dialysis catheter has been placed with the tip at the cavoatrial junction. Layering bilateral effusions. Vascular congestion and mild interstitial/ alveolar process in the lungs bilaterally, right greater than left, likely asymmetric edema. Heart is borderline in size. No pneumothorax. IMPRESSION: Right scratched at right dialysis catheter tip at the cavoatrial junction. No pneumothorax. Suspect mild asymmetric edema. Small layering bilateral effusions. Electronically Signed   By: Charlett Nose M.D.   On: 12/15/2016 08:45   Dg Chest Port 1 View  Result Date: 12/13/2016 CLINICAL DATA:  71 year old male with fluid overload. EXAM: PORTABLE CHEST 1 VIEW COMPARISON:  None. FINDINGS: There small bilateral pleural effusions and minimal associated bibasilar compressive atelectasis. Pneumonia is not excluded. Clinical correlation is recommended. No pneumothorax. Borderline cardiomegaly with mild vascular congestion. No acute osseous pathology. IMPRESSION: Borderline cardiomegaly with mild vascular congestion and small pleural effusions. Electronically  Signed   By: Elgie Collard M.D.   On: 12/13/2016 22:33   Dg Fluoro Guide Cv Line-no Report  Result Date: 12/15/2016 Fluoroscopy was utilized by the requesting physician.  No radiographic interpretation.    Lab Data:  CBC: Recent Labs  Lab 12/13/16 1813 12/14/16 1232 12/14/16 1930 12/15/16 0344  WBC 4.9 5.7  --  5.9  HGB 4.1* 6.5* 8.1* 7.7*  HCT 12.6* 19.5* 24.3* 22.5*  MCV 77.3* 79.9  --  80.9  PLT 200 178  --  187   Basic Metabolic Panel: Recent Labs  Lab 12/13/16 1813 12/14/16 0009 12/14/16 1232 12/15/16 0047 12/15/16 0344  NA 135  --  133* 135 136  K 4.7  --  5.1 4.5 4.7  CL 112*  --  111 110 112*  CO2 11*  --  11* 13* 12*  GLUCOSE 78  --  77 99 80  BUN 98*  --  98* 100* 101*  CREATININE 13.73*  --  13.27* 13.11* 13.10*  CALCIUM 5.8*  --  6.3* 6.5* 6.6*  PHOS  --  9.3*  --   --   --    GFR: Estimated Creatinine Clearance: 4.8 mL/min (A) (by C-G formula based on SCr of 13.1 mg/dL (H)). Liver Function Tests: Recent Labs  Lab 12/14/16 0009 12/15/16 0344  ALBUMIN 2.6* 2.5*   No results for input(s): LIPASE, AMYLASE in the last 168 hours. No results for input(s): AMMONIA in the last 168 hours. Coagulation Profile: Recent Labs  Lab 12/13/16 2116  INR 1.33   Cardiac Enzymes: No results for input(s): CKTOTAL, CKMB, CKMBINDEX, TROPONINI in the last 168 hours. BNP (last 3 results) No results for input(s): PROBNP in the last 8760 hours. HbA1C: Recent Labs    12/15/16 0344  HGBA1C 5.2   CBG: Recent Labs  Lab 12/14/16 1623 12/14/16 2144 12/15/16 0544 12/15/16 0616 12/15/16 0827  GLUCAP 137* 103* 67 152* 82   Lipid Profile: No results for input(s): CHOL, HDL, LDLCALC, TRIG, CHOLHDL, LDLDIRECT in the last 72 hours. Thyroid Function Tests: No results for input(s): TSH, T4TOTAL, FREET4, T3FREE, THYROIDAB in the last 72 hours. Anemia Panel: Recent Labs    12/13/16 2230  VITAMINB12 727  FOLATE 13.0  FERRITIN 309  TIBC 196*  IRON 106    RETICCTPCT 1.6   Urine analysis:  Component Value Date/Time   COLORURINE STRAW (A) 12/13/2016 1910   APPEARANCEUR CLEAR 12/13/2016 1910   LABSPEC 1.008 12/13/2016 1910   PHURINE 6.0 12/13/2016 1910   GLUCOSEU 50 (A) 12/13/2016 1910   HGBUR MODERATE (A) 12/13/2016 1910   BILIRUBINUR NEGATIVE 12/13/2016 1910   KETONESUR NEGATIVE 12/13/2016 1910   PROTEINUR 100 (A) 12/13/2016 1910   UROBILINOGEN 0.2 07/16/2008 0820   NITRITE NEGATIVE 12/13/2016 1910   LEUKOCYTESUR NEGATIVE 12/13/2016 1910     Ripudeep Rai M.D. [facility] 12/15/2016, 10:40 AM  Pager: [phone] Between 7am to 7pm - call Pager - [phone]  After 7pm go to [email or web address] - password [email or web address]  Call night coverage person covering after 7pm

## 2016-12-15 NOTE — Op Note (Signed)
OPERATIVE NOTE  PROCEDURE: 1.  Right internal jugular vein tunneled dialysis catheter placement 2.  Right internal jugular vein cannulation under ultrasound guidance  PRE-OPERATIVE DIAGNOSIS: end-stage renal failure  POST-OPERATIVE DIAGNOSIS: same as above  SURGEON: Leonides SakeBrian Abubakr Wieman, MD  ANESTHESIA: general  ESTIMATED BLOOD LOSS: 30 cc  FINDING(S): 1.  Tips of the catheter in the right atrium on fluoroscopy 2.  No obvious pneumothorax on fluoroscopy  SPECIMEN(S):  none  INDICATIONS:   [name] Dudley is a 71 y.o. male who presents with hemodialysis dependence.  The patient presents for tunneled dialysis catheter placement.  The patient is aware the risks of tunneled dialysis catheter placement include but are not limited to: bleeding, infection, central venous injury, pneumothorax, possible venous stenosis, possible malpositioning in the venous system, and possible infections related to long-term catheter presence.  The patient was aware of these risks and agreed to proceed.   DESCRIPTION: After written full informed consent was obtained from the patient, the patient was taken back to the operating room.  Prior to induction, the patient was given IV antibiotics.  After obtaining adequate sedation, the patient was prepped and draped in the standard fashion for a chest or neck tunneled dialysis catheter placement.     Under ultrasound guidance, the right internal jugular vein was cannulated with the 18 gauge needle.  A J-wire was then placed down into the inferior vena cava under fluoroscopic guidance.  The wire was then secured in place with a clamp to the drapes.  The tapered wire guide was left occluding the lumen of the needle to avoid air embolism.    I then made stab incisions at the neck and exit sites.   I dissected from the exit site to the cannulation site with a tunneler.   The wire was then unclamped and I removed the needle.  The skin tract and venotomy was dilated serially  with graduated dilators, passed under fluoroscopic guidance.   Finally, the dilator-sheath was placed under fluoroscopic guidance into the superior vena cava.  The central dilator and wire were removed.  A 23 cm Palindrome catheter was placed under fluoroscopic guidance down into the right atrium.    The sheath was broken and peeled away while holding the catheter cuff at the level of the skin.  The catheter was clamped with the plastic clamp and the back end of this catheter was transected.  One lumen was docked onto the tunneler.  The catheter was delivered through the subcutaneous tunnel by pulling the tunneler out the exit site.  The catheter was reclamped and was transected a second time, revealing the two lumens of this catheter.  The catheter collar was loaded over the back end of the catheter.  The two  ports were docked onto these two lumens.  The catheter collar was then snapped into place, securing the two ports.  Each port was tested by aspirating and flushing.  No resistance was noted.  Each port was then thoroughly flushed with heparinized saline.  The catheter was secured in placed with two interrupted stitches of 3-0 Nylon tied to the catheter.  The neck incision was closed with a U-stitch of 4-0 Monocryl.  The neck and chest incision were cleaned.  The closed neck incision was reinforced with Dermabond and sterile bandages applied to the catheter exit site.  Each port was then loaded with concentrated heparin (1000 Units/mL) at the manufacturer recommended volumes to each port.  Sterile caps were applied to each port.  On completion fluoroscopy, the tips of the catheter were in the right atrium, and there was no evidence of pneumothorax.   COMPLICATIONS: none  CONDITION: stable   Leonides SakeBrian Calob Baskette, MD, [facility] [place] Office: [phone] Pager: [phone]  12/15/2016, 8:22 AM

## 2016-12-15 NOTE — Transfer of Care (Signed)
Immediate Anesthesia Transfer of Care Note  Patient: [name] LemonsSylvester Dow  Procedure(s) Performed: INSERTION OF TUNNEL DIALYSIS CATHETER - Right Internal Jugular (Right )  Patient Location: PACU  Anesthesia Type:General  Level of Consciousness: awake, alert , oriented and patient cooperative  Airway & Oxygen Therapy: Patient Spontanous Breathing and Patient connected to nasal cannula oxygen  Post-op Assessment: Report given to RN, Post -op Vital signs reviewed and stable and Patient moving all extremities  Post vital signs: Reviewed and stable  Last Vitals:  Vitals:   12/14/16 2145 12/15/16 0447  BP: (!) 165/88 (!) 175/80  Pulse: 71 65  Resp: 18 16  Temp: 37.2 C 36.4 C  SpO2: 100% 100%    Last Pain:  Vitals:   12/15/16 0447  TempSrc: Oral         Complications: No apparent anesthesia complications

## 2016-12-15 NOTE — Progress Notes (Signed)
CBG is 67. Pt is going to OR this am and is NPO. Amp of D50 was given. Will recheck CBG in 15 min.   Larey Dayshristy M Umar Patmon, RN

## 2016-12-15 NOTE — Anesthesia Procedure Notes (Signed)
Procedure Name: LMA Insertion Date/Time: 12/15/2016 7:54 AM Performed by: Lucinda Dellecarlo, Anayelli Lai M, CRNA Pre-anesthesia Checklist: Patient identified, Emergency Drugs available, Suction available and Patient being monitored Patient Re-evaluated:Patient Re-evaluated prior to induction Oxygen Delivery Method: Circle system utilized Preoxygenation: Pre-oxygenation with 100% oxygen Induction Type: IV induction LMA: LMA inserted LMA Size: 4.0 Number of attempts: 1 Placement Confirmation: positive ETCO2 and breath sounds checked- equal and bilateral Tube secured with: Tape Dental Injury: Teeth and Oropharynx as per pre-operative assessment

## 2016-12-15 NOTE — Progress Notes (Signed)
Pt gone to dialysis via bed.   

## 2016-12-15 NOTE — Progress Notes (Signed)
Admit: 12/13/2016 LOS: 2  57M with new ESRD, severe anemia.    Subjective:  TDC this AM, for HD#1 today No other issues Hb up to 7.7 Ca up to 6.6  11/17 0701 - 11/18 0700 In: 1574 [P.O.:600; Blood:850; IV Piggyback:124] Out: 625 [Urine:625]  Filed Weights   12/14/16 0135 12/15/16 0156 12/15/16 0858  Weight: (!) 166.7 kg (367 lb 8.1 oz) (!) 166.7 kg (367 lb 8.1 oz) 75.3 kg (166 lb)    Scheduled Meds: . calcitRIOL  1 mcg Oral Daily  . calcium carbonate  800 mg of elemental calcium Oral BID  . cholecalciferol  1,000 Units Oral Daily  . cloNIDine  0.2 mg Oral BID  . darbepoetin (ARANESP) injection - DIALYSIS  100 mcg Intravenous Q Sat-HD  . diazepam  1 mg Oral QHS  . heparin  5,000 Units Subcutaneous Q8H  . insulin aspart  0-5 Units Subcutaneous QHS  . insulin aspart  0-9 Units Subcutaneous TID WC  . terazosin  1 mg Oral QHS   Continuous Infusions: . sodium chloride    . sodium chloride    . furosemide Stopped (12/14/16 2009)   PRN Meds:.sodium chloride, sodium chloride, acetaminophen, alteplase, heparin, hydrALAZINE, ondansetron (ZOFRAN) IV, polyethylene glycol, zolpidem  Current Labs: reviewed 11/17 Renal US 8.5cm b/l with medical renal disease, no obstruction   Physical Exam:  Blood pressure (!) 152/78, pulse 65, temperature 97.7 F (36.5 C), resp. rate 10, height 5\' 7"  (1.702 m), weight 75.3 kg (166 lb), SpO2 99 %. GEN: NAD ENT: NCaT EYES: EOMI CV: No rub, nl s1s2, RRR PULM: ctab ,no wrr ABD: s/nt/nd, no suprapubic tenderness SKIN: yellow tint to skin EXT: 3+ pitting LEE into prox legs    Assessment/Plan 57M with new ESRD, severe anemia.    1. New ESRD.  Pt agreeable to start iHD.   1. R IJ TDC 11/18 VVS 2. For AVF/AVG later in week 3. HD #1 today after TDC: very gentle first Tx with high ca bath 4. [facility]ed [facility] -- [facility] has concerns with [facility] and financing  5. Renal US consistent with ESRD 2. Severe anemia 1. rec 3u pRBC transfusion at  HD 2. TSAT 54% and Ferritin 309; B12 replete; likely all related to CKD/ESRD 3. Will start ESA with HD 4. SPEP and sFLC given age + renal failure pending 3. CKD-BMD withHypocalcemia and hyperphosphatemia; 1. PTH pending 2. C3 1mcg daily for now 3. high Ca bath with HD 4. Tums BID not as binder and as binder 4. Hypervolemia / LEE: will address with HD 5. HTN 6. DM2    Sabra Heckyan Sima Lindenberger MD 12/15/2016, 10:01 AM  Recent Labs  Lab 12/14/16 0009 12/14/16 1232 12/15/16 0047 12/15/16 0344  NA  --  133* 135 136  K  --  5.1 4.5 4.7  CL  --  111 110 112*  CO2  --  11* 13* 12*  GLUCOSE  --  77 99 80  BUN  --  98* 100* 101*  CREATININE  --  13.27* 13.11* 13.10*  CALCIUM  --  6.3* 6.5* 6.6*  PHOS 9.3*  --   --   --    Recent Labs  Lab 12/13/16 1813 12/14/16 1232 12/14/16 1930 12/15/16 0344  WBC 4.9 5.7  --  5.9  HGB 4.1* 6.5* 8.1* 7.7*  HCT 12.6* 19.5* 24.3* 22.5*  MCV 77.3* 79.9  --  80.9  PLT 200 178  --  187

## 2016-12-16 ENCOUNTER — Inpatient Hospital Stay (HOSPITAL_COMMUNITY): Payer: Non-veteran care

## 2016-12-16 ENCOUNTER — Encounter (HOSPITAL_COMMUNITY): Payer: Self-pay | Admitting: Vascular Surgery

## 2016-12-16 DIAGNOSIS — I340 Nonrheumatic mitral (valve) insufficiency: Secondary | ICD-10-CM

## 2016-12-16 LAB — BASIC METABOLIC PANEL
ANION GAP: 11 (ref 5–15)
BUN: 73 mg/dL — ABNORMAL HIGH (ref 6–20)
CO2: 16 mmol/L — AB (ref 22–32)
Calcium: 6.8 mg/dL — ABNORMAL LOW (ref 8.9–10.3)
Chloride: 109 mmol/L (ref 101–111)
Creatinine, Ser: 9.68 mg/dL — ABNORMAL HIGH (ref 0.61–1.24)
GFR, EST AFRICAN AMERICAN: 5 mL/min — AB (ref >60)
GFR, EST NON AFRICAN AMERICAN: 5 mL/min — AB (ref >60)
GLUCOSE: 87 mg/dL (ref 65–99)
POTASSIUM: 4 mmol/L (ref 3.5–5.1)
Sodium: 136 mmol/L (ref 135–145)

## 2016-12-16 LAB — CBC
HEMATOCRIT: 23.3 % — AB (ref 39.0–52.0)
Hemoglobin: 7.8 g/dL — ABNORMAL LOW (ref 13.0–17.0)
MCH: 27.1 pg (ref 26.0–34.0)
MCHC: 33.5 g/dL (ref 30.0–36.0)
MCV: 80.9 fL (ref 78.0–100.0)
Platelets: 175 10*3/uL (ref 150–400)
RBC: 2.88 MIL/uL — AB (ref 4.22–5.81)
RDW: 16.7 % — ABNORMAL HIGH (ref 11.5–15.5)
WBC: 7 10*3/uL (ref 4.0–10.5)

## 2016-12-16 LAB — PROTEIN ELECTROPHORESIS, SERUM
A/G RATIO SPE: 0.8 (ref 0.7–1.7)
Albumin ELP: 2.7 g/dL — ABNORMAL LOW (ref 2.9–4.4)
Alpha-1-Globulin: 0.2 g/dL (ref 0.0–0.4)
Alpha-2-Globulin: 0.8 g/dL (ref 0.4–1.0)
BETA GLOBULIN: 0.7 g/dL (ref 0.7–1.3)
GLOBULIN, TOTAL: 3.6 g/dL (ref 2.2–3.9)
Gamma Globulin: 1.8 g/dL (ref 0.4–1.8)
TOTAL PROTEIN ELP: 6.3 g/dL (ref 6.0–8.5)

## 2016-12-16 LAB — ECHOCARDIOGRAM COMPLETE
Height: 67 in
WEIGHTICAEL: 2610.25 [oz_av]

## 2016-12-16 LAB — GLUCOSE, CAPILLARY
GLUCOSE-CAPILLARY: 169 mg/dL — AB (ref 65–99)
GLUCOSE-CAPILLARY: 127 mg/dL — AB (ref 65–99)
GLUCOSE-CAPILLARY: 105 mg/dL — AB (ref 65–99)
Glucose-Capillary: 467 mg/dL — ABNORMAL HIGH (ref 65–99)
Glucose-Capillary: 113 mg/dL — ABNORMAL HIGH (ref 65–99)

## 2016-12-16 LAB — HEPATITIS B CORE ANTIBODY, TOTAL: Hep B Core Total Ab: NEGATIVE

## 2016-12-16 LAB — KAPPA/LAMBDA LIGHT CHAINS
KAPPA, LAMDA LIGHT CHAIN RATIO: 3.01 — AB (ref 0.26–1.65)
Kappa free light chain: 364.1 mg/L — ABNORMAL HIGH (ref 3.3–19.4)
LAMDA FREE LIGHT CHAINS: 121 mg/L — AB (ref 5.7–26.3)

## 2016-12-16 LAB — HEPATITIS B SURFACE ANTIGEN: Hepatitis B Surface Ag: NEGATIVE

## 2016-12-16 MED ORDER — INFLUENZA VAC SPLIT HIGH-DOSE 0.5 ML IM SUSY
0.5000 mL | PREFILLED_SYRINGE | INTRAMUSCULAR | Status: AC
  Administered 2016-12-17: 0.5 mL via INTRAMUSCULAR
  Filled 2016-12-16: qty 0.5

## 2016-12-16 MED ORDER — AMLODIPINE BESYLATE 5 MG PO TABS
5.0000 mg | ORAL_TABLET | Freq: Every day | ORAL | Status: DC
  Administered 2016-12-16: 5 mg via ORAL
  Filled 2016-12-16 (×2): qty 1

## 2016-12-16 NOTE — Progress Notes (Signed)
[facility]                                                                              Patient Demographics  [name] Dudley, is a 71 y.o. male, DOB - 1945-12-10, YQI:[id number]  Admit date - 12/13/2016   Admitting Physician Lorretta Harp, MD  Outpatient Primary MD for the patient is [facility]  Outpatient specialists:   LOS - 3  days   Medical records reviewed and are as summarized below:    Chief Complaint  Patient presents with  . Leg Swelling       Brief summary   Patient is a 71 year old male with CKD stage IV, hypertension, diabetes, tobacco abuse, BPH presented with shortness of breath, peripheral edema.  Patient follows nephrology encouraged nurse will be hospital and was recommended dialysis but he had opted not to.  Patient presented with shortness of breath, progressively getting worse, worsening bilateral leg edema, found to have hemoglobin of 4.1.  Patient was admitted for further workup.    Assessment & Plan    Principal Problem:   Anemia due to end stage renal disease (HCC) -Hemoglobin 4.1 at the time of admission, FOBT negative, likely due to anemia of renal disease -Hemoglobin 7.8, had required 4 units packed RBC during this admission -Anemia profile consistent with anemia of chronic disease - start ESA with hemodialysis  Active Problems: CKD stage IV, now end-stage renal disease, peripheral edema, dyspnea -Nephrology and vascular surgery consulted, -Renal ultrasound showed no evidence of hydronephrosis, increased renal parenchymal echogenicity -TDC placed 11/18, started hemodialysis 11/18, does he still need lasix? Nephrology following - will need CLIP for outpatient dialysis center    Hypertension -BP somewhat elevated, on clonidine, hydralazine.  HD started -Placed on Norvasc 5 mg daily.      Hypocalcemia - per nephrology    BPH (benign prostatic hyperplasia) -Continue terazosin    Type II diabetes mellitus with  renal manifestations (HCC) -Continue sliding scale insulin, hemoglobin A1c 5.2 -CBG of 467 likely error this morning on the glucometer, repeat was 169  Code Status: full  DVT Prophylaxis: Heparin subcu Family Communication: Discussed in detail with the patient, all imaging results, lab results explained to the patient  Disposition Plan: once approved for outpatient dialysis center  Time Spent in minutes  25 minutes  Procedures:    Consultants:   Nephrology Vascular surgery  Antimicrobials:      Medications  Scheduled Meds: . calcitRIOL  1 mcg Oral Daily  . calcium carbonate  800 mg of elemental calcium Oral TID WC  . cholecalciferol  1,000 Units Oral Daily  . cloNIDine  0.2 mg Oral BID  . darbepoetin (ARANESP) injection - NON-DIALYSIS  100 mcg Subcutaneous Q Sun-1800  . diazepam  1 mg Oral QHS  . heparin  5,000 Units Subcutaneous Q8H  . insulin aspart  0-5 Units Subcutaneous QHS  . insulin aspart  0-9 Units Subcutaneous TID WC  . terazosin  1 mg Oral QHS   Continuous Infusions: . sodium chloride    . sodium chloride    . furosemide Stopped (12/16/16 0942)   PRN Meds:.sodium chloride, sodium chloride,  acetaminophen, alteplase, heparin, hydrALAZINE, ondansetron (ZOFRAN) IV, polyethylene glycol, zolpidem   Antibiotics   Anti-infectives (From admission, onward)   Start     Dose/Rate Route Frequency Ordered Stop   12/15/16 0742  dextrose 5 % with cefUROXime (ZINACEF) ADS Med    Comments:  Dionne BucyDecarlo, Valerie   : cabinet override      12/15/16 0742 12/15/16 0758        Subjective:   Lavella LemonsSylvester Touchton was seen and examined today.  Denies any specific complaints.  Tolerated hemodialysis on 11/18. Patient denies dizziness, chest pain, abdominal pain, N/V/D/C, new weakness, numbess, tingling.   Objective:   Vitals:   12/16/16 0359 12/16/16 0441 12/16/16 0854 12/16/16 0928  BP:  (!) 172/75 (!) 184/76 (!) 167/77  Pulse:  77 74   Resp:  15 18   Temp:  98.5 F (36.9  C) 98.8 F (37.1 C)   TempSrc:   Oral   SpO2:  98% 99%   Weight: 74 kg (163 lb 2.3 oz)     Height:        Intake/Output Summary (Last 24 hours) at 12/16/2016 1019 Last data filed at 12/16/2016 1000 Gross per 24 hour  Intake 820 ml  Output 2175 ml  Net -1355 ml     Wt Readings from Last 3 Encounters:  12/16/16 74 kg (163 lb 2.3 oz)     Exam   General: Alert and oriented, NAD   Eyes:   HEENT:    Cardiovascular: S1 S2 clear, RRR, 2-3 edema+  Respiratory: Clear to auscultation bilaterally, no wheezing, rales or rhonchi  Gastrointestinal: Soft, nontender, nondistended, + bowel sounds  Ext: 2+ pedal edema bilaterally  Neuro: no deficits  Musculoskeletal: No digital cyanosis, clubbing  Skin: No rashes  Psych: Normal affect and demeanor, alert and oriented x3    Data Reviewed:  I have personally reviewed following labs and imaging studies  Micro Results Recent Results (from the past 240 hour(s))  MRSA PCR Screening     Status: None   Collection Time: 12/14/16  3:22 AM  Result Value Ref Range Status   MRSA by PCR NEGATIVE NEGATIVE Final    Comment:        The GeneXpert MRSA Assay (FDA approved for NASAL specimens only), is one component of a comprehensive MRSA colonization surveillance program. It is not intended to diagnose MRSA infection nor to guide or monitor treatment for MRSA infections.   Surgical pcr screen     Status: None   Collection Time: 12/15/16  6:02 AM  Result Value Ref Range Status   MRSA, PCR NEGATIVE NEGATIVE Final   Staphylococcus aureus NEGATIVE NEGATIVE Final    Comment: (NOTE) The Xpert SA Assay (FDA approved for NASAL specimens in patients 71 years of age and older), is one component of a comprehensive surveillance program. It is not intended to diagnose infection nor to guide or monitor treatment.     Radiology Reports [place] [facility]  Result Date: 12/14/2016 CLINICAL DATA:  Acute onset of renal insufficiency. EXAM: RENAL /  URINARY TRACT ULTRASOUND COMPLETE COMPARISON:  None. FINDINGS: Right Kidney: Length: 8.5 cm. Increased parenchymal echogenicity is noted. A 0.5 cm cyst is noted at the upper pole of the right kidney. No hydronephrosis visualized. Left Kidney: Length: 8.5 cm. Increased parenchymal echogenicity is noted. A 0.7 cm cyst is noted at the upper pole of the left kidney. No hydronephrosis visualized. Bladder: Appears normal for degree of bladder distention. Small bilateral pleural effusions are noted. IMPRESSION: 1.  No evidence of hydronephrosis. 2. Increased renal parenchymal echogenicity raises concern for medical renal disease. 3. Tiny bilateral renal cysts seen. 4. Small bilateral pleural effusions noted. Electronically Signed   By: Roanna Raider M.D.   On: 12/14/2016 03:36   Dg Chest Port 1 View  Result Date: 12/15/2016 CLINICAL DATA:  Postop dialysis catheter placement EXAM: PORTABLE CHEST 1 VIEW COMPARISON:  12/13/2016 FINDINGS: Right side internal jugular dialysis catheter has been placed with the tip at the cavoatrial junction. Layering bilateral effusions. Vascular congestion and mild interstitial/ alveolar process in the lungs bilaterally, right greater than left, likely asymmetric edema. Heart is borderline in size. No pneumothorax. IMPRESSION: Right scratched at right dialysis catheter tip at the cavoatrial junction. No pneumothorax. Suspect mild asymmetric edema. Small layering bilateral effusions. Electronically Signed   By: Charlett Nose M.D.   On: 12/15/2016 08:45   Dg Chest Port 1 View  Result Date: 12/13/2016 CLINICAL DATA:  71 year old male with fluid overload. EXAM: PORTABLE CHEST 1 VIEW COMPARISON:  None. FINDINGS: There small bilateral pleural effusions and minimal associated bibasilar compressive atelectasis. Pneumonia is not excluded. Clinical correlation is recommended. No pneumothorax. Borderline cardiomegaly with mild vascular congestion. No acute osseous pathology. IMPRESSION:  Borderline cardiomegaly with mild vascular congestion and small pleural effusions. Electronically Signed   By: Elgie Collard M.D.   On: 12/13/2016 22:33   Dg Fluoro Guide Cv Line-no Report  Result Date: 12/15/2016 Fluoroscopy was utilized by the requesting physician.  No radiographic interpretation.    Lab Data:  CBC: Recent Labs  Lab 12/13/16 1813 12/14/16 1232 12/14/16 1930 12/15/16 0344 12/16/16 0457  WBC 4.9 5.7  --  5.9 7.0  HGB 4.1* 6.5* 8.1* 7.7* 7.8*  HCT 12.6* 19.5* 24.3* 22.5* 23.3*  MCV 77.3* 79.9  --  80.9 80.9  PLT 200 178  --  187 175   Basic Metabolic Panel: Recent Labs  Lab 12/13/16 1813 12/14/16 0009 12/14/16 1232 12/15/16 0047 12/15/16 0344 12/16/16 0457  NA 135  --  133* 135 136 136  K 4.7  --  5.1 4.5 4.7 4.0  CL 112*  --  111 110 112* 109  CO2 11*  --  11* 13* 12* 16*  GLUCOSE 78  --  77 99 80 87  BUN 98*  --  98* 100* 101* 73*  CREATININE 13.73*  --  13.27* 13.11* 13.10* 9.68*  CALCIUM 5.8*  --  6.3* 6.5* 6.6* 6.8*  PHOS  --  9.3*  --   --   --   --    GFR: Estimated Creatinine Clearance: 6.5 mL/min (A) (by C-G formula based on SCr of 9.68 mg/dL (H)). Liver Function Tests: Recent Labs  Lab 12/14/16 0009 12/15/16 0344  ALBUMIN 2.6* 2.5*   No results for input(s): LIPASE, AMYLASE in the last 168 hours. No results for input(s): AMMONIA in the last 168 hours. Coagulation Profile: Recent Labs  Lab 12/13/16 2116  INR 1.33   Cardiac Enzymes: No results for input(s): CKTOTAL, CKMB, CKMBINDEX, TROPONINI in the last 168 hours. BNP (last 3 results) No results for input(s): PROBNP in the last 8760 hours. HbA1C: Recent Labs    12/15/16 0344  HGBA1C 5.2   CBG: Recent Labs  Lab 12/15/16 1135 12/15/16 1640 12/15/16 2134 12/16/16 0806 12/16/16 0821  GLUCAP 156* 142* 115* 467* 169*   Lipid Profile: No results for input(s): CHOL, HDL, LDLCALC, TRIG, CHOLHDL, LDLDIRECT in the last 72 hours. Thyroid Function Tests: No results for  input(s): TSH, T4TOTAL,  FREET4, T3FREE, THYROIDAB in the last 72 hours. Anemia Panel: Recent Labs    12/13/16 2230  VITAMINB12 727  FOLATE 13.0  FERRITIN 309  TIBC 196*  IRON 106  RETICCTPCT 1.6   Urine analysis:    Component Value Date/Time   COLORURINE STRAW (A) 12/13/2016 1910   APPEARANCEUR CLEAR 12/13/2016 1910   LABSPEC 1.008 12/13/2016 1910   PHURINE 6.0 12/13/2016 1910   GLUCOSEU 50 (A) 12/13/2016 1910   HGBUR MODERATE (A) 12/13/2016 1910   BILIRUBINUR NEGATIVE 12/13/2016 1910   KETONESUR NEGATIVE 12/13/2016 1910   PROTEINUR 100 (A) 12/13/2016 1910   UROBILINOGEN 0.2 07/16/2008 0820   NITRITE NEGATIVE 12/13/2016 1910   LEUKOCYTESUR NEGATIVE 12/13/2016 1910     Ripudeep Rai M.D. [facility] 12/16/2016, 10:19 AM  Pager: [phone] Between 7am to 7pm - call Pager - [phone]  After 7pm go to [email or web address] - password [email or web address]  Call night coverage person covering after 7pm

## 2016-12-16 NOTE — Progress Notes (Signed)
VASCULAR SURGERY:  The patient has a tunneled dialysis catheter for dialysis.  Dr. Leonides SakeBrian Chen said that he was to be scheduled for vein mapping and then scheduled for placement of access while he is in the hospital.  However today the patient tells me that he is not interested in having surgery on his arm and is refusing to consider access at this point.  His vein map is still pending.  Waverly Ferrarihristopher Firas Guardado, MD, FACS Beeper [phone] Office: [phone]

## 2016-12-16 NOTE — Progress Notes (Addendum)
[facility] Rounding Note Subjective:   TDC, HD #1 on 11/18. Tolerated OK Weight down 1 kg Vein map is pending, he is refusing permanent access at this time   11/18 0701 - 11/19 0700 In: 1210 [P.O.:1060; I.V.:150] Out: 1545 [Urine:1525; Blood:20]  Filed Weights   12/15/16 1507 12/15/16 2052 12/16/16 0359  Weight: 74 kg (163 lb 2.3 oz) 74 kg (163 lb 2.3 oz) 74 kg (163 lb 2.3 oz)   Physical Exam:  BP (!) 167/77   Pulse 74   Temp 98.8 F (37.1 C) (Oral)   Resp 18   Ht 5\' 7"  (1.702 m)   Wt 74 kg (163 lb 2.3 oz)   SpO2 99%   BMI 25.55 kg/m  GEN: NAD ENT: NCaT EYES: EOMI CV: No rub, nl s1s2, RRR PULM: ctab ,no wrr ABD: s/nt/nd, no suprapubic tenderness SKIN: yellow tint to skin EXT: 3+ pitting LEE into prox legs  Scheduled Meds: . amLODipine  5 mg Oral Daily  . calcitRIOL  1 mcg Oral Daily  . calcium carbonate  800 mg of elemental calcium Oral TID WC  . cholecalciferol  1,000 Units Oral Daily  . cloNIDine  0.2 mg Oral BID  . darbepoetin (ARANESP) injection - NON-DIALYSIS  100 mcg Subcutaneous Q Sun-1800  . diazepam  1 mg Oral QHS  . heparin  5,000 Units Subcutaneous Q8H  . insulin aspart  0-5 Units Subcutaneous QHS  . insulin aspart  0-9 Units Subcutaneous TID WC  . terazosin  1 mg Oral QHS   Continuous Infusions: . sodium chloride    . sodium chloride    . furosemide Stopped (12/16/16 0942)   PRN Meds:.sodium chloride, sodium chloride, acetaminophen, alteplase, heparin, hydrALAZINE, ondansetron (ZOFRAN) IV, polyethylene glycol, zolpidem  11/17 Renal US 8.5cm b/l with medical renal disease, no obstruction  Ref Range & Units 2d ago  Kappa free light chain 3.3 - 19.4 mg/L 364.1 Abnormally high    Lamda free light chains 5.7 - 26.3 mg/L 121.0 Abnormally high    Kappa, lamda light chain ratio 0.26 - 1.65 3.01 Abnormally high     Basic Metabolic Panel: Recent Labs    12/14/16 0009  12/15/16 0344 12/16/16 0457  NA  --    < > 136 136  K  --    < > 4.7 4.0  CL  --    <  > 112* 109  CO2  --    < > 12* 16*  GLUCOSE  --    < > 80 87  BUN  --    < > 101* 73*  CREATININE  --    < > 13.10* 9.68*  CALCIUM  --    < > 6.6* 6.8*  PHOS 9.3*  --   --   --    < > = values in this interval not displayed.   Liver Function Tests: Recent Labs    12/14/16 0009 12/15/16 0344  ALBUMIN 2.6* 2.5*   CBC: Recent Labs    12/15/16 0344 12/16/16 0457  WBC 5.9 7.0  HGB 7.7* 7.8*  HCT 22.5* 23.3*  MCV 80.9 80.9  PLT 187 175   Anemia Panel: Recent Labs    12/13/16 2230  VITAMINB12 727  FOLATE 13.0  FERRITIN 309  TIBC 196*  IRON 106  RETICCTPCT 1.6   Results for [name] Dudley, [name] (MRN [id number]) as of 12/16/2016 16:44  Ref. Range 12/14/2016 12:32  PTH, Intact Latest Ref Range: 15 - 65 pg/mL 737 (H)   Recent  Labs  Lab 12/14/16 0009  12/15/16 0047 12/15/16 0344 12/16/16 0457  NA  --    < > 135 136 136  K  --    < > 4.5 4.7 4.0  CL  --    < > 110 112* 109  CO2  --    < > 13* 12* 16*  GLUCOSE  --    < > 99 80 87  BUN  --    < > 100* 101* 73*  CREATININE  --    < > 13.11* 13.10* 9.68*  CALCIUM  --    < > 6.5* 6.6* 6.8*  PHOS 9.3*  --   --   --   --    < > = values in this interval not displayed.   Recent Labs  Lab 12/14/16 1232 12/14/16 1930 12/15/16 0344 12/16/16 0457  WBC 5.7  --  5.9 7.0  HGB 6.5* 8.1* 7.7* 7.8*  HCT 19.5* 24.3* 22.5* 23.3*  MCV 79.9  --  80.9 80.9  PLT 178  --  187 175     Background: 87M with new ESRD ([facility] prior RF[facility] severe anemia.    Assessment/Plan  1. New ESR[facility].  [facility] HD #1 yesterday 11/18 1. For AVF/AVG later in week 2. HD #2 tomorrow 3. [facility] with [facility] and financing  4. Advised will need permanent access in place if is to HD in outpt unit in [place] 2. Severe anemia 1. 3u pRBC transfusion at HD, total 4 u this admit 2. TSAT 54% and Ferritin 309; B12 replete; likely all related to CKD/ESRD 3. Aranesp 100 QSunday started 11/18 (change  to a dialysis day once schedule known) 4. SPEP no m-spike. Elevation of both kappa and lambda LC, ratio 3. Will curbside with hematology. May all be consequence of rather than causal of renal failure but will check with them re additional eval since 24 hr urine not accurate w/ESRD 3. Secondary HPT w/Hypocalcemia and hyperphosphatemia 1. PTH 767  2. Calcitriol  1mcg daily for now (consolidate to HD days once clear where unit will be and what days) 3. high Ca bath with HD 4. Tums BID not as binder and with meals as binder 4. Hypervolemia / LEE: will address with HD 5. HTN 6. DM2  Camille Balynthia Reynold Mantell, MD [facility] [phone] Pager 12/16/2016, 4:45 PM   Addendum: After additional conversation with patient re permanent access, he is now agreeable "if that is what is required"  Camille Balynthia Sharri Loya, MD [facility] [phone] Pager 12/16/2016, 9:16 PM

## 2016-12-16 NOTE — Progress Notes (Signed)
  Echocardiogram 2D Echocardiogram has been performed.  Nolon RodBrown, Tony 12/16/2016, 10:34 AM

## 2016-12-17 LAB — CBC
HCT: 22.3 % — ABNORMAL LOW (ref 39.0–52.0)
Hemoglobin: 7.5 g/dL — ABNORMAL LOW (ref 13.0–17.0)
MCH: 27.3 pg (ref 26.0–34.0)
MCHC: 33.6 g/dL (ref 30.0–36.0)
MCV: 81.1 fL (ref 78.0–100.0)
PLATELETS: 155 10*3/uL (ref 150–400)
RBC: 2.75 MIL/uL — AB (ref 4.22–5.81)
RDW: 16.9 % — AB (ref 11.5–15.5)
WBC: 6.2 10*3/uL (ref 4.0–10.5)

## 2016-12-17 LAB — TYPE AND SCREEN
ABO/RH(D): A POS
Antibody Screen: NEGATIVE
UNIT DIVISION: 0
UNIT DIVISION: 0
UNIT DIVISION: 0
Unit division: 0
Unit division: 0
Unit division: 0

## 2016-12-17 LAB — BASIC METABOLIC PANEL
ANION GAP: 10 (ref 5–15)
BUN: 76 mg/dL — ABNORMAL HIGH (ref 6–20)
CALCIUM: 7.1 mg/dL — AB (ref 8.9–10.3)
CO2: 17 mmol/L — ABNORMAL LOW (ref 22–32)
Chloride: 106 mmol/L (ref 101–111)
Creatinine, Ser: 10.14 mg/dL — ABNORMAL HIGH (ref 0.61–1.24)
GFR, EST AFRICAN AMERICAN: 5 mL/min — AB (ref >60)
GFR, EST NON AFRICAN AMERICAN: 4 mL/min — AB (ref >60)
Glucose, Bld: 75 mg/dL (ref 65–99)
POTASSIUM: 4.2 mmol/L (ref 3.5–5.1)
Sodium: 133 mmol/L — ABNORMAL LOW (ref 135–145)

## 2016-12-17 LAB — BPAM RBC
BLOOD PRODUCT EXPIRATION DATE: 201811302359
BLOOD PRODUCT EXPIRATION DATE: 201811302359
BLOOD PRODUCT EXPIRATION DATE: 201812062359
Blood Product Expiration Date: 201811302359
Blood Product Expiration Date: 201811302359
Blood Product Expiration Date: 201812012359
ISSUE DATE / TIME: 201811141116
ISSUE DATE / TIME: 201811141116
ISSUE DATE / TIME: 201811162221
ISSUE DATE / TIME: 201811170158
ISSUE DATE / TIME: 201811171512
ISSUE DATE / TIME: 201811170530
UNIT TYPE AND RH: 6200
UNIT TYPE AND RH: 6200
UNIT TYPE AND RH: 6200
Unit Type and Rh: 6200
Unit Type and Rh: 6200
Unit Type and Rh: 6200

## 2016-12-17 LAB — GLUCOSE, CAPILLARY
GLUCOSE-CAPILLARY: 94 mg/dL (ref 65–99)
Glucose-Capillary: 114 mg/dL — ABNORMAL HIGH (ref 65–99)
Glucose-Capillary: 319 mg/dL — ABNORMAL HIGH (ref 65–99)
Glucose-Capillary: 99 mg/dL (ref 65–99)

## 2016-12-17 LAB — HEPATITIS B E ANTIBODY: HEP B E AB: NEGATIVE

## 2016-12-17 MED ORDER — HEPARIN BOLUS VIA INFUSION
1500.0000 [IU] | Freq: Once | INTRAVENOUS | Status: AC
  Administered 2016-12-17: 1500 [IU] via INTRAVENOUS

## 2016-12-17 MED ORDER — HYDRALAZINE HCL 20 MG/ML IJ SOLN
10.0000 mg | Freq: Once | INTRAMUSCULAR | Status: AC
  Administered 2016-12-17: 10 mg via INTRAVENOUS

## 2016-12-17 MED ORDER — AMLODIPINE BESYLATE 10 MG PO TABS
10.0000 mg | ORAL_TABLET | Freq: Every day | ORAL | Status: DC
  Administered 2016-12-17 – 2016-12-19 (×3): 10 mg via ORAL
  Filled 2016-12-17 (×3): qty 1

## 2016-12-17 MED ORDER — DARBEPOETIN ALFA 100 MCG/0.5ML IJ SOSY: 100.0000 ug | PREFILLED_SYRINGE | INTRAMUSCULAR | Status: DC

## 2016-12-17 NOTE — Progress Notes (Signed)
[facility] Rounding Note Subjective:   TDC, HD #1 on 11/18. HD#2 today with 2L goal Feeling OK Agreeable now to permanent access Worried about transposition issues (does not drive, lives alone) - told him can ride [facility]  11/19 0701 - 11/20 0700 In: 830 [P.O.:780; IV Piggyback:50] Out: 2975 [Urine:2975]  Filed Weights   12/15/16 2052 12/16/16 0359 12/17/16 0704  Weight: 74 kg (163 lb 2.3 oz) 74 kg (163 lb 2.3 oz) 74.5 kg (164 lb 3.9 oz)   Physical Exam:  BP (!) 180/98 (BP Location: Right Arm)   Pulse 66   Temp 98.3 F (36.8 C) (Oral)   Resp 13   Ht 5\' 7"  (1.702 m)   Wt 74.5 kg (164 lb 3.9 oz) Comment: Standing Weight  SpO2 99%   BMI 25.72 kg/m  NAD Seen on HD, felling OK Regular. S1S2 No S3 S4  No murmur or rub Lungs clear Abdomen soft and not tender 3+ LE edema TDC in use for HD  Scheduled Meds: . amLODipine  5 mg Oral Daily  . calcitRIOL  1 mcg Oral Daily  . calcium carbonate  800 mg of elemental calcium Oral TID WC  . cholecalciferol  1,000 Units Oral Daily  . cloNIDine  0.2 mg Oral BID  . darbepoetin (ARANESP) injection - NON-DIALYSIS  100 mcg Subcutaneous Q Sun-1800  . diazepam  1 mg Oral QHS  . heparin  5,000 Units Subcutaneous Q8H  . Influenza vac split quadrivalent PF  0.5 mL Intramuscular Tomorrow-1000  . insulin aspart  0-5 Units Subcutaneous QHS  . insulin aspart  0-9 Units Subcutaneous TID WC  . terazosin  1 mg Oral QHS   Continuous Infusions: . sodium chloride    . sodium chloride    . furosemide Stopped (12/16/16 1840)   PRN Meds:.sodium chloride, sodium chloride, acetaminophen, alteplase, heparin, hydrALAZINE, ondansetron (ZOFRAN) IV, polyethylene glycol, zolpidem  11/17 Renal US 8.5cm b/l with medical renal disease, no obstruction   Ref Range & Units 2d ago  Kappa free light chain 3.3 - 19.4 mg/L 364.1   Lamda free light chains 5.7 - 26.3 mg/L 121.0   Kappa, lamda light chain ratio 0.26 - 1.65 3.01      Basic Metabolic Panel: Recent Labs     12/16/16 0457 12/17/16 0543  NA 136 133*  K 4.0 4.2  CL 109 106  CO2 16* 17*  GLUCOSE 87 75  BUN 73* 76*  CREATININE 9.68* 10.14*  CALCIUM 6.8* 7.1*   Liver Function Tests: Recent Labs    12/15/16 0344  ALBUMIN 2.5*   CBC: Recent Labs    12/16/16 0457 12/17/16 0543  WBC 7.0 6.2  HGB 7.8* 7.5*  HCT 23.3* 22.3*  MCV 80.9 81.1  PLT 175 155   Results for [name], [name] (MRN [id number]) as of 12/16/2016 16:44  Ref. Range 12/14/2016 12:32  PTH, Intact Latest Ref Range: 15 - 65 pg/mL 737 (H)   Recent Labs  Lab 12/14/16 0009  12/15/16 0344 12/16/16 0457 12/17/16 0543  NA  --    < > 136 136 133*  K  --    < > 4.7 4.0 4.2  CL  --    < > 112* 109 106  CO2  --    < > 12* 16* 17*  GLUCOSE  --    < > 80 87 75  BUN  --    < > 101* 73* 76*  CREATININE  --    < > 13.10* 9.68* 10.14*  CALCIUM  --    < > 6.6* 6.8* 7.1*  PHOS 9.3*  --   --   --   --    < > = values in this interval not displayed.   Recent Labs  Lab 12/15/16 0344 12/16/16 0457 12/17/16 0543  WBC 5.9 7.0 6.2  HGB 7.7* 7.8* 7.5*  HCT 22.5* 23.3* 22.3*  MCV 80.9 80.9 81.1  PLT 187 175 155     Background: 54M with new ESRD ([facility] prior RF[facility] severe anemia.    Assessment/Plan  1. New ESRD.  [facility] #1 yesterday 11/18 1. For AVF/AVG later in week (now agrees - need to make [facility] aware) 2. HD #2 today and #3 tomorrow 3. [facility] concerns with [facility] and financing  4. Stop IV lasix 2. Severe anemia 1. 3u pRBC transfusion at HD, total 4 u this admit 2. TSAT 54% and Ferritin 309; B12 replete; likely all related to CKD/ESRD 3. Aranesp 100 QSunday started 11/18 (change to a dialysis day once schedule known) 4. SPEP no m-spike. Elevation of both kappa and lambda LC, ratio 3. Will curbside with hematology. May all be consequence of rather than causal of renal failure but will check with them re additional eval since 24 hr urine not accurate  w/ESRD 3. Secondary HPT w/Hypocalcemia and hyperphosphatemia 1. PTH 767  2. Calcitriol  1mcg daily for now (consolidate to HD days once clear where unit will be and what days) 3. High Ca bath with HD 4. Tums BID not as binder and with meals as binder 4. Hypervolemia / LEE: will address with HD 5. HTN 6. DM2  Camille Balynthia Jonell Brumbaugh, MD [facility] [phone] Pager 12/17/2016, 8:00 AM

## 2016-12-17 NOTE — Progress Notes (Signed)
[facility]                                                                              Patient Demographics  [name] Dudley, is a 71 y.o. male, DOB - [birth year], NFA:[id number]  Admit date - 12/13/2016   Admitting Physician Lorretta HarpXilin Niu, MD  [facility]  Outpatient specialists:   LOS - 4  days   Medical records reviewed and are as summarized below:    Chief Complaint  Patient presents with  . Leg Swelling       Brief summary   Patient is a 71 year old male with CKD stage IV, hypertension, diabetes, tobacco abuse, BPH presented with shortness of breath, peripheral edema.  Patient follows nephrology encouraged nurse will be hospital and was recommended dialysis but he had opted not to.  Patient presented with shortness of breath, progressively getting worse, worsening bilateral leg edema, found to have hemoglobin of 4.1.  Patient was admitted for further workup.    Assessment & Plan    Principal Problem:   Anemia due to end stage renal disease (HCC) -Hemoglobin 4.1 at the time of admission, FOBT negative, likely due to anemia of renal disease -Hemoglobin 7.5, had required 4 units packed RBC during this admission -Anemia profile consistent with anemia of chronic disease - started ESA with hemodialysis  Active Problems: CKD stage IV, now end-stage renal disease, peripheral edema, dyspnea -Nephrology and vascular surgery consulted, -Renal ultrasound showed no evidence of hydronephrosis, increased renal parenchymal echogenicity -TDC placed 11/18, started hemodialysis 11/18 (#1), receiving hemodialysis #2 today, plan for HD in a.m. as well - will need CLIP for outpatient dialysis center    Hypertension -BP still somewhat elevated, on clonidine, hydralazine as needed -will increase Norvasc to 10 mg daily -BP will improved with HD.       Hypocalcemia - per nephrology improving    BPH (benign prostatic  hyperplasia) -Continue terazosin    Type II diabetes mellitus with renal manifestations (HCC) - hemoglobin A1c 5.2 -CBGs controlled, continue siding scale insulin  Code Status: full  DVT Prophylaxis: Heparin subcu Family Communication: Discussed in detail with the patient, all imaging results, lab results explained to the patient  Disposition Plan: once approved for outpatient dialysis center  Time Spent in minutes  25 minutes  Procedures:    Consultants:   Nephrology Vascular surgery  Antimicrobials:      Medications  Scheduled Meds: . amLODipine  5 mg Oral Daily  . calcitRIOL  1 mcg Oral Daily  . calcium carbonate  800 mg of elemental calcium Oral TID WC  . cholecalciferol  1,000 Units Oral Daily  . cloNIDine  0.2 mg Oral BID  . darbepoetin (ARANESP) injection - NON-DIALYSIS  100 mcg Subcutaneous Q Sun-1800  . diazepam  1 mg Oral QHS  . heparin  5,000 Units Subcutaneous Q8H  . Influenza vac split quadrivalent PF  0.5 mL Intramuscular Tomorrow-1000  . insulin aspart  0-5 Units Subcutaneous QHS  . insulin aspart  0-9 Units Subcutaneous TID WC  . terazosin  1 mg Oral QHS   Continuous Infusions:  PRN Meds:.acetaminophen, hydrALAZINE, ondansetron (ZOFRAN) IV, polyethylene glycol, zolpidem   Antibiotics   Anti-infectives (From admission, onward)   Start     Dose/Rate Route Frequency Ordered Stop   12/15/16 0742  dextrose 5 % with cefUROXime (ZINACEF) ADS Med    Comments:  Dionne Bucy   : cabinet override      12/15/16 0742 12/15/16 0758        Subjective:   [name] Lemons was seen and examined today initially, no complaints.  Tolerating hemodialysis. Patient denies dizziness, chest pain, abdominal pain, N/V/D/C, new weakness, numbess, tingling.   Objective:   Vitals:   12/17/16 0900 12/17/16 0930 12/17/16 1000 12/17/16 1004  BP: (!) 126/94 (!) 171/86 (!) 172/78 (!) 185/87  Pulse: 70 67 70 68  Resp: 14 13 14 14   Temp:    98.3 F (36.8 C)    TempSrc:      SpO2:    99%  Weight:   73 kg (160 lb 15 oz)   Height:        Intake/Output Summary (Last 24 hours) at 12/17/2016 1115 Last data filed at 12/17/2016 1100 Gross per 24 hour  Intake 1030 ml  Output 3825 ml  Net -2795 ml     Wt Readings from Last 3 Encounters:  12/17/16 73 kg (160 lb 15 oz)     Exam  Physical Exam  General: Alert and oriented x 3, NAD  Eyes:   HEENT:    Cardiovascular: S1 S2 clear Regular rate and rhythm. 2+ pedal edema b/l  Respiratory: Clear to auscultation bilaterally, no wheezing, rales or rhonchi  Gastrointestinal: Soft, nontender, nondistended, + bowel sounds  Ext: 2+ pedal edema bilaterally  Neuro: no new deficits  Musculoskeletal: No digital cyanosis, clubbing  Skin: No rashes  Psych: Normal affect and demeanor, alert and oriented x3   Data Reviewed:  I have personally reviewed following labs and imaging studies  Micro Results Recent Results (from the past 240 hour(s))  MRSA PCR Screening     Status: None   Collection Time: 12/14/16  3:22 AM  Result Value Ref Range Status   MRSA by PCR NEGATIVE NEGATIVE Final    Comment:        The GeneXpert MRSA Assay (FDA approved for NASAL specimens only), is one component of a comprehensive MRSA colonization surveillance program. It is not intended to diagnose MRSA infection nor to guide or monitor treatment for MRSA infections.   Surgical pcr screen     Status: None   Collection Time: 12/15/16  6:02 AM  Result Value Ref Range Status   MRSA, PCR NEGATIVE NEGATIVE Final   Staphylococcus aureus NEGATIVE NEGATIVE Final    Comment: (NOTE) The Xpert SA Assay (FDA approved for NASAL specimens in patients 7 years of age and older), is one component of a comprehensive surveillance program. It is not intended to diagnose infection nor to guide or monitor treatment.     Radiology Reports US Renal  Result Date: 12/14/2016 CLINICAL DATA:  Acute onset of renal  insufficiency. EXAM: RENAL / URINARY TRACT ULTRASOUND COMPLETE COMPARISON:  None. FINDINGS: Right Kidney: Length: 8.5 cm. Increased parenchymal echogenicity is noted. A 0.5 cm cyst is noted at the upper pole of the right kidney. No hydronephrosis visualized. Left Kidney: Length: 8.5 cm. Increased parenchymal echogenicity is noted. A 0.7 cm cyst is noted at the upper pole of the left kidney. No hydronephrosis visualized. Bladder: Appears normal for degree of bladder distention. Small bilateral pleural effusions are noted.  IMPRESSION: 1. No evidence of hydronephrosis. 2. Increased renal parenchymal echogenicity raises concern for medical renal disease. 3. Tiny bilateral renal cysts seen. 4. Small bilateral pleural effusions noted. Electronically Signed   By: Roanna RaiderJeffery  Chang M.D.   On: 12/14/2016 03:36   Dg Chest Port 1 View  Result Date: 12/15/2016 CLINICAL DATA:  Postop dialysis catheter placement EXAM: PORTABLE CHEST 1 VIEW COMPARISON:  12/13/2016 FINDINGS: Right side internal jugular dialysis catheter has been placed with the tip at the cavoatrial junction. Layering bilateral effusions. Vascular congestion and mild interstitial/ alveolar process in the lungs bilaterally, right greater than left, likely asymmetric edema. Heart is borderline in size. No pneumothorax. IMPRESSION: Right scratched at right dialysis catheter tip at the cavoatrial junction. No pneumothorax. Suspect mild asymmetric edema. Small layering bilateral effusions. Electronically Signed   By: Charlett NoseKevin  Dover M.D.   On: 12/15/2016 08:45   Dg Chest Port 1 View  Result Date: 12/13/2016 CLINICAL DATA:  71 year old male with fluid overload. EXAM: PORTABLE CHEST 1 VIEW COMPARISON:  None. FINDINGS: There small bilateral pleural effusions and minimal associated bibasilar compressive atelectasis. Pneumonia is not excluded. Clinical correlation is recommended. No pneumothorax. Borderline cardiomegaly with mild vascular congestion. No acute osseous  pathology. IMPRESSION: Borderline cardiomegaly with mild vascular congestion and small pleural effusions. Electronically Signed   By: Elgie CollardArash  Radparvar M.D.   On: 12/13/2016 22:33   Dg Fluoro Guide Cv Line-no Report  Result Date: 12/15/2016 Fluoroscopy was utilized by the requesting physician.  No radiographic interpretation.    Lab Data:  CBC: Recent Labs  Lab 12/13/16 1813 12/14/16 1232 12/14/16 1930 12/15/16 0344 12/16/16 0457 12/17/16 0543  WBC 4.9 5.7  --  5.9 7.0 6.2  HGB 4.1* 6.5* 8.1* 7.7* 7.8* 7.5*  HCT 12.6* 19.5* 24.3* 22.5* 23.3* 22.3*  MCV 77.3* 79.9  --  80.9 80.9 81.1  PLT 200 178  --  187 175 155   Basic Metabolic Panel: Recent Labs  Lab 12/14/16 0009 12/14/16 1232 12/15/16 0047 12/15/16 0344 12/16/16 0457 12/17/16 0543  NA  --  133* 135 136 136 133*  K  --  5.1 4.5 4.7 4.0 4.2  CL  --  111 110 112* 109 106  CO2  --  11* 13* 12* 16* 17*  GLUCOSE  --  77 99 80 87 75  BUN  --  98* 100* 101* 73* 76*  CREATININE  --  13.27* 13.11* 13.10* 9.68* 10.14*  CALCIUM  --  6.3* 6.5* 6.6* 6.8* 7.1*  PHOS 9.3*  --   --   --   --   --    GFR: Estimated Creatinine Clearance: 6.2 mL/min (A) (by C-G formula based on SCr of 10.14 mg/dL (H)). Liver Function Tests: Recent Labs  Lab 12/14/16 0009 12/15/16 0344  ALBUMIN 2.6* 2.5*   No results for input(s): LIPASE, AMYLASE in the last 168 hours. No results for input(s): AMMONIA in the last 168 hours. Coagulation Profile: Recent Labs  Lab 12/13/16 2116  INR 1.33   Cardiac Enzymes: No results for input(s): CKTOTAL, CKMB, CKMBINDEX, TROPONINI in the last 168 hours. BNP (last 3 results) No results for input(s): PROBNP in the last 8760 hours. HbA1C: Recent Labs    12/15/16 0344  HGBA1C 5.2   CBG: Recent Labs  Lab 12/16/16 0821 12/16/16 1146 12/16/16 1707 12/16/16 2146 12/17/16 0901  GLUCAP 169* 127* 105* 113* 114*   Lipid Profile: No results for input(s): CHOL, HDL, LDLCALC, TRIG, CHOLHDL, LDLDIRECT  in the last 72 hours.  Thyroid Function Tests: No results for input(s): TSH, T4TOTAL, FREET4, T3FREE, THYROIDAB in the last 72 hours. Anemia Panel: No results for input(s): VITAMINB12, FOLATE, FERRITIN, TIBC, IRON, RETICCTPCT in the last 72 hours. Urine analysis:    Component Value Date/Time   COLORURINE STRAW (A) 12/13/2016 1910   APPEARANCEUR CLEAR 12/13/2016 1910   LABSPEC 1.008 12/13/2016 1910   PHURINE 6.0 12/13/2016 1910   GLUCOSEU 50 (A) 12/13/2016 1910   HGBUR MODERATE (A) 12/13/2016 1910   BILIRUBINUR NEGATIVE 12/13/2016 1910   KETONESUR NEGATIVE 12/13/2016 1910   PROTEINUR 100 (A) 12/13/2016 1910   UROBILINOGEN 0.2 07/16/2008 0820   NITRITE NEGATIVE 12/13/2016 1910   LEUKOCYTESUR NEGATIVE 12/13/2016 1910     Brittani Purdum M.D. [facility] 12/17/2016, 11:15 AM  Pager: [phone] Between 7am to 7pm - call Pager - [phone]  After 7pm go to [email or web address] - password [email or web address]  Call night coverage person covering after 7pm

## 2016-12-17 NOTE — Progress Notes (Signed)
Rechecked patient's BP manually one hour later, BP 180/82 pulse 74. Per MD, gave patient Hydralazine 10 mg IVP. Will continue to monitor.

## 2016-12-17 NOTE — Care Management Note (Signed)
Case Management Note  Patient Details  Name: [name] LemonsSylvester Dorn MRN: [id number] Date of Birth: Feb 08, 1945  Subjective/Objective:    CM following for progression and d/c planning.                 Action/Plan: 12/17/2016 Informed that this pt has Medicare A only, this CM attempting to learn if this will cover outpatient hemodialysis in the community. HD dept , Velna HatchetSheila working with this pt and [facility] office.   Expected Discharge Date:                  Expected Discharge Plan:  Skilled Nursing Facility  In-House Referral:  Clinical Social Work  Discharge planning Services  CM Consult  Post Acute Care Choice:    Choice offered to:     DME Arranged:    DME Agency:     HH Arranged:    HH Agency:     Status of Service:  In process, will continue to follow  If discussed at Long Length of Stay Meetings, dates discussed:    Additional Comments:  Starlyn SkeansRoyal, [name] U, RN 12/17/2016, 4:55 PM

## 2016-12-17 NOTE — Procedures (Signed)
I have personally attended this patient's dialysis session.   2 liter goal/2-3+ edema TDC 250 K 4.2 2K bath Hb 7.5 (had Aranesp 100 Sunday 11/18) BP stable  [name] Balynthia Ilias Stcharles, MD [facility] [phone] Pager 12/17/2016, 8:10 AM

## 2016-12-17 NOTE — Progress Notes (Signed)
   VASCULAR SURGERY ASSESSMENT & PLAN:   The patient is reportedly now willing to proceed with hemodialysis access. This has been scheduled for Friday.  It does not look like he's had a vein map so I have reordered this.  His hemoglobin is 7.5. He will likely require preoperative transfusion time of his next HD.    SUBJECTIVE:   No complaints  PHYSICAL EXAM:   Vitals:   12/17/16 1004 12/17/16 1125 12/17/16 1131 12/17/16 1257  BP: (!) 185/87 (!) 202/83 (!) 202/96 (!) 180/82  Pulse: 68 75 78 74  Resp: 14     Temp: 98.3 F (36.8 C)     TempSrc:      SpO2: 99%     Weight:      Height:       Palpable left radial pulse. He appears to have a usable left upper arm cephalic vein.   LABS:   Lab Results  Component Value Date   WBC 6.2 12/17/2016   HGB 7.5 (L) 12/17/2016   HCT 22.3 (L) 12/17/2016   MCV 81.1 12/17/2016   PLT 155 12/17/2016   Lab Results  Component Value Date   CREATININE 10.14 (H) 12/17/2016   Lab Results  Component Value Date   INR 1.33 12/13/2016   CBG (last 3)  Recent Labs    12/16/16 2146 12/17/16 0901 12/17/16 1120  GLUCAP 113* 114* 319*    PROBLEM LIST:    Principal Problem:   Anemia due to end stage renal disease (HCC) Active Problems:   Hypertension   Hypocalcemia   ESRD (end stage renal disease) (HCC)   BPH (benign prostatic hyperplasia)   Type II diabetes mellitus with renal manifestations (HCC)   CURRENT MEDS:   . amLODipine  10 mg Oral Daily  . calcitRIOL  1 mcg Oral Daily  . calcium carbonate  800 mg of elemental calcium Oral TID WC  . cholecalciferol  1,000 Units Oral Daily  . cloNIDine  0.2 mg Oral BID  . [START ON 12/23/2016] darbepoetin (ARANESP) injection - DIALYSIS  100 mcg Intravenous Q Mon-HD  . diazepam  1 mg Oral QHS  . heparin  5,000 Units Subcutaneous Q8H  . insulin aspart  0-5 Units Subcutaneous QHS  . insulin aspart  0-9 Units Subcutaneous TID WC  . terazosin  1 mg Oral QHS    [name] FerrariChristopher  Dudley Beeper: [phone] Office: [phone] 12/17/2016

## 2016-12-17 NOTE — Progress Notes (Signed)
Patients BP using dinamap: 202/83 pulse 75 Manual BP: 202/96 Pulse 78 Norvasc given. Will recheck in one hour. MD notified. Will continue to monitor.

## 2016-12-18 ENCOUNTER — Encounter (HOSPITAL_COMMUNITY): Payer: Non-veteran care

## 2016-12-18 LAB — RENAL FUNCTION PANEL
ANION GAP: 11 (ref 5–15)
Albumin: 2.4 g/dL — ABNORMAL LOW (ref 3.5–5.0)
BUN: 47 mg/dL — ABNORMAL HIGH (ref 6–20)
CHLORIDE: 101 mmol/L (ref 101–111)
CO2: 21 mmol/L — AB (ref 22–32)
Calcium: 7.9 mg/dL — ABNORMAL LOW (ref 8.9–10.3)
Creatinine, Ser: 7.4 mg/dL — ABNORMAL HIGH (ref 0.61–1.24)
GFR calc Af Amer: 8 mL/min — ABNORMAL LOW (ref >60)
GFR calc non Af Amer: 7 mL/min — ABNORMAL LOW (ref >60)
GLUCOSE: 161 mg/dL — AB (ref 65–99)
POTASSIUM: 3.5 mmol/L (ref 3.5–5.1)
Phosphorus: 4.1 mg/dL (ref 2.5–4.6)
Sodium: 133 mmol/L — ABNORMAL LOW (ref 135–145)

## 2016-12-18 LAB — CBC
HEMATOCRIT: 22.6 % — AB (ref 39.0–52.0)
HEMOGLOBIN: 7.6 g/dL — AB (ref 13.0–17.0)
MCH: 28.1 pg (ref 26.0–34.0)
MCHC: 33.6 g/dL (ref 30.0–36.0)
MCV: 83.7 fL (ref 78.0–100.0)
Platelets: 151 10*3/uL (ref 150–400)
RBC: 2.7 MIL/uL — ABNORMAL LOW (ref 4.22–5.81)
RDW: 17.4 % — ABNORMAL HIGH (ref 11.5–15.5)
WBC: 6.2 10*3/uL (ref 4.0–10.5)

## 2016-12-18 LAB — GLUCOSE, CAPILLARY
Glucose-Capillary: 121 mg/dL — ABNORMAL HIGH (ref 65–99)
Glucose-Capillary: 131 mg/dL — ABNORMAL HIGH (ref 65–99)
Glucose-Capillary: 64 mg/dL — ABNORMAL LOW (ref 65–99)

## 2016-12-18 LAB — PREPARE RBC (CROSSMATCH)

## 2016-12-18 MED ORDER — CALCITRIOL 0.5 MCG PO CAPS
ORAL_CAPSULE | ORAL | Status: AC
  Filled 2016-12-18: qty 2

## 2016-12-18 MED ORDER — SODIUM CHLORIDE 0.9 % IV SOLN
Freq: Once | INTRAVENOUS | Status: AC
  Administered 2016-12-18: 13:00:00 via INTRAVENOUS

## 2016-12-18 MED ORDER — CEFAZOLIN SODIUM-DEXTROSE 1-4 GM/50ML-% IV SOLN
1.0000 g | INTRAVENOUS | Status: DC
  Filled 2016-12-18: qty 50

## 2016-12-18 NOTE — Progress Notes (Signed)
PROGRESS NOTE [facility]   [name]   ZOX:[id number] DOB: [birth year]  DOA: 12/13/2016 [facility]   Brief Narrative:  [name] LemonsSylvester Dudley  is a 71 year old male with CKD stage IV, hypertension, diabetes, tobacco abuse, BPH presented with shortness of breath, peripheral edema.  Patient follows nephrology was recommended dialysis but he had opted not to. On admission patient presented with shortness of breath, progressively getting worse, worsening bilateral leg edema, found to have hemoglobin of 4.1. Patient was admitted for further workup. Patient was transfused 4 units of PRBC's. Nephrology was consulted and was started on HD.   Subjective: Patient seen and examined, doing well after HD. Has no complaints. Getting blood pre surgical, concern about transportation for HD. Told him to use [facility]   Assessment & Plan: Anemia due to end stage renal disease (HCC) Hemoglobin 4.1 at the time of admission, FOBT negative, likely due to anemia of renal disease Hgb stable, getting 1 unit PRBC's for a total of 5 units during admission  Anemia profile consistent with anemia of chronic disease Continue ESA with hemodialysis  CKD stage IV, now end-stage renal disease, peripheral edema, dyspnea Nephrology and vascular surgery consulted, Renal ultrasound showed no evidence of hydronephrosis, increased renal parenchymal echogenicity TDC placed 11/18, started hemodialysis 11/18 (#1), receiving hemodialysis #2 11/20, hemodialysis #3 today.  Planning for permanent assess on Friday  Awaiting for CLIP OP dialysis center,   Hypertension BP still elevated on clonidine and  Norvasc to 10 mg daily  Will add hydralazine 10 mg TID. No significantly overloaded, HD not helping much      Hypocalcemia Continue management per nephro   BPH Continue terazosin  Type II diabetes mellitus with renal manifestations (HCC) Hemoglobin A1c 5.2 CBGs controlled, continue siding scale  insulin  DVT prophylaxis: Hep sq Code Status: Full  Family Communication: None at bedside  Disposition Plan: Awaiting OP HD center    Consultants:   Nephrology   Vascular surgery   Procedures:   None   Antimicrobials:  None     Objective: Vitals:   12/18/16 1331 12/18/16 1557 12/18/16 1616 12/18/16 1713  BP: (!) 182/78 (!) 187/79 (!) 188/77 (!) 174/72  Pulse: 66 66 70 70  Resp: 17 18 18 17   Temp: 98.6 F (37 C) 98.7 F (37.1 C) 98.5 F (36.9 C) 98.7 F (37.1 C)  TempSrc: Oral Oral Oral Oral  SpO2: 99% 98% 99% 100%  Weight:      Height:        Intake/Output Summary (Last 24 hours) at 12/18/2016 1719 Last data filed at 12/18/2016 1445 Gross per 24 hour  Intake 707.17 ml  Output 4275 ml  Net -3567.83 ml   Filed Weights   12/17/16 2118 12/18/16 0900 12/18/16 1225  Weight: 73 kg (160 lb 15 oz) 73.2 kg (161 lb 6 oz) 70 kg (154 lb 5.2 oz)    Examination:  General exam: Appears calm and comfortable  Respiratory system: Clear to auscultation. No wheezes,crackle or rhonchi Cardiovascular system: S1 & S2 heard, RRR. No JVD, murmurs, rubs or gallops Gastrointestinal system: Abdomen is nondistended, soft and nontender. Normal bowel sounds heard. Central nervous system: Alert and oriented. Extremities: No LE edema.  Skin: No rashes, lesions or ulcers Psychiatry: Mood & affect appropriate.    Data Reviewed: I have personally reviewed following labs and imaging studies  CBC: Recent Labs  Lab 12/14/16 1232 12/14/16 1930 12/15/16 0344 12/16/16 0457 12/17/16 0543 12/18/16 1004  WBC 5.7  --  5.9  7.0 6.2 6.2  HGB 6.5* 8.1* 7.7* 7.8* 7.5* 7.6*  HCT 19.5* 24.3* 22.5* 23.3* 22.3* 22.6*  MCV 79.9  --  80.9 80.9 81.1 83.7  PLT 178  --  187 175 155 151   Basic Metabolic Panel: Recent Labs  Lab 12/14/16 0009  12/15/16 0047 12/15/16 0344 12/16/16 0457 12/17/16 0543 12/18/16 1004  NA  --    < > 135 136 136 133* 133*  K  --    < > 4.5 4.7 4.0 4.2 3.5  CL   --    < > 110 112* 109 106 101  CO2  --    < > 13* 12* 16* 17* 21*  GLUCOSE  --    < > 99 80 87 75 161*  BUN  --    < > 100* 101* 73* 76* 47*  CREATININE  --    < > 13.11* 13.10* 9.68* 10.14* 7.40*  CALCIUM  --    < > 6.5* 6.6* 6.8* 7.1* 7.9*  PHOS 9.3*  --   --   --   --   --  4.1   < > = values in this interval not displayed.   GFR: Estimated Creatinine Clearance: 8.6 mL/min (A) (by C-G formula based on SCr of 7.4 mg/dL (H)). Liver Function Tests: Recent Labs  Lab 12/14/16 0009 12/15/16 0344 12/18/16 1004  ALBUMIN 2.6* 2.5* 2.4*   No results for input(s): LIPASE, AMYLASE in the last 168 hours. No results for input(s): AMMONIA in the last 168 hours. Coagulation Profile: Recent Labs  Lab 12/13/16 2116  INR 1.33   Cardiac Enzymes: No results for input(s): CKTOTAL, CKMB, CKMBINDEX, TROPONINI in the last 168 hours. BNP (last 3 results) No results for input(s): PROBNP in the last 8760 hours. HbA1C: No results for input(s): HGBA1C in the last 72 hours. CBG: Recent Labs  Lab 12/17/16 1120 12/17/16 1641 12/17/16 2116 12/18/16 0756 12/18/16 1659  GLUCAP 319* 94 99 121* 131*   Lipid Profile: No results for input(s): CHOL, HDL, LDLCALC, TRIG, CHOLHDL, LDLDIRECT in the last 72 hours. Thyroid Function Tests: No results for input(s): TSH, T4TOTAL, FREET4, T3FREE, THYROIDAB in the last 72 hours. Anemia Panel: No results for input(s): VITAMINB12, FOLATE, FERRITIN, TIBC, IRON, RETICCTPCT in the last 72 hours. Sepsis Labs: No results for input(s): PROCALCITON, LATICACIDVEN in the last 168 hours.  Recent Results (from the past 240 hour(s))  MRSA PCR Screening     Status: None   Collection Time: 12/14/16  3:22 AM  Result Value Ref Range Status   MRSA by PCR NEGATIVE NEGATIVE Final    Comment:        The GeneXpert MRSA Assay (FDA approved for NASAL specimens only), is one component of a comprehensive MRSA colonization surveillance program. It is not intended to diagnose  MRSA infection nor to guide or monitor treatment for MRSA infections.   Surgical pcr screen     Status: None   Collection Time: 12/15/16  6:02 AM  Result Value Ref Range Status   MRSA, PCR NEGATIVE NEGATIVE Final   Staphylococcus aureus NEGATIVE NEGATIVE Final    Comment: (NOTE) The Xpert SA Assay (FDA approved for NASAL specimens in patients 27 years of age and older), is one component of a comprehensive surveillance program. It is not intended to diagnose infection nor to guide or monitor treatment.       Radiology Studies: No results found.    Scheduled Meds: . amLODipine  10 mg Oral Daily  .  calcitRIOL      . calcitRIOL  1 mcg Oral Daily  . calcium carbonate  800 mg of elemental calcium Oral TID WC  . cholecalciferol  1,000 Units Oral Daily  . cloNIDine  0.2 mg Oral BID  . [START ON 12/23/2016] darbepoetin (ARANESP) injection - DIALYSIS  100 mcg Intravenous Q Mon-HD  . diazepam  1 mg Oral QHS  . heparin  5,000 Units Subcutaneous Q8H  . insulin aspart  0-5 Units Subcutaneous QHS  . insulin aspart  0-9 Units Subcutaneous TID WC  . terazosin  1 mg Oral QHS   Continuous Infusions: . [START ON 12/20/2016]  ceFAZolin (ANCEF) IV       LOS: 5 days    Time spent: Total of 25 minutes spent with pt, greater than 50% of which was spent in discussion of  treatment, counseling and coordination of care  Latrelle DodrillEdwin Silva, MD Pager: Text Page via [email or web address]   If 7PM-7AM, please contact night-coverage [email or web address] 12/18/2016, 5:19 PM

## 2016-12-18 NOTE — Care Management Important Message (Signed)
Important Message  Patient Details  Name: [name] Dudley MRN: [id number] Date of Birth: 01/20/1946   Medicare Important Message Given:  Yes    Kyla BalzarineShealy, Simonne Boulos Abena 12/18/2016, 10:25 AM

## 2016-12-18 NOTE — Procedures (Signed)
I have personally attended this patient's dialysis session.   4K bath K 3.5 UF goal 3 liters Transfuse 1 unit PRBC's  Camille Balynthia Jolyne Laye, MD [facility] [phone] Pager 12/18/2016, 10:49 AM

## 2016-12-18 NOTE — Progress Notes (Signed)
[facility] Rounding Note Subjective:   Getting HD #3 today For permanent access on Friday ( Dr[facility] Ed[facility] Boickson) [facility] so [facility] spot yet (and pt frustrated) Worried about transposition issues (does not drive, lives alone) - told him can ride SCAT  11/20 0701 - 11/21 0700 In: 720 [P.O.:720] Out: 2600 [Urine:1100]  Filed Weights   12/17/16 1000 12/17/16 2118 12/18/16 0900  Weight: 73 kg (160 lb 15 oz) 73 kg (160 lb 15 oz) 73.2 kg (161 lb 6 oz)   Physical Exam:  BP (!) (P) 175/88 (BP Location: Right Arm)   Pulse 65   Temp (P) 97.8 F (36.6 C) (Oral)   Resp (P) 18   Ht 5\' 7"  (1.702 m)   Wt 73.2 kg (161 lb 6 oz)   SpO2 (P) 99%   BMI 25.28 kg/m  NAD Pre HD weight 73 kg UF goal 3 liters Regular. S1S2 No S3 S4  No murmur or rub Lungs clear Abdomen soft and not tender 2+ LE edema improving TDC in use for HD at present  Scheduled Meds: . amLODipine  10 mg Oral Daily  . calcitRIOL  1 mcg Oral Daily  . calcium carbonate  800 mg of elemental calcium Oral TID WC  . cholecalciferol  1,000 Units Oral Daily  . cloNIDine  0.2 mg Oral BID  . [START ON 12/23/2016] darbepoetin (ARANESP) injection - DIALYSIS  100 mcg Intravenous Q Mon-HD  . diazepam  1 mg Oral QHS  . heparin  5,000 Units Subcutaneous Q8H  . insulin aspart  0-5 Units Subcutaneous QHS  . insulin aspart  0-9 Units Subcutaneous TID WC  . terazosin  1 mg Oral QHS   Continuous Infusions: . [START ON 12/20/2016]  ceFAZolin (ANCEF) IV     PRN Meds:.acetaminophen, hydrALAZINE, ondansetron (ZOFRAN) IV, polyethylene glycol, zolpidem  11/17 Renal US 8.5cm b/l with medical renal disease, no obstruction   Ref Range & Units 2d ago  Kappa free light chain 3.3 - 19.4 mg/L 364.1   Lamda free light chains 5.7 - 26.3 mg/L 121.0   Kappa, lamda light chain ratio 0.26 - 1.65 3.01      CBC: Recent Labs    12/17/16 0543 12/18/16 1004  WBC 6.2 6.2  HGB 7.5* 7.6*  HCT 22.3* 22.6*  MCV 81.1 83.7  PLT 155 151   Results for  [name] LemonsWIKE, [name] (MRN [id number]) as of 12/16/2016 16:44  Ref. Range 12/14/2016 12:32  PTH, Intact Latest Ref Range: 15 - 65 pg/mL 737 (H)   Recent Labs  Lab 12/14/16 0009  12/16/16 0457 12/17/16 0543 12/18/16 1004  NA  --    < > 136 133* 133*  K  --    < > 4.0 4.2 3.5  CL  --    < > 109 106 101  CO2  --    < > 16* 17* 21*  GLUCOSE  --    < > 87 75 161*  BUN  --    < > 73* 76* 47*  CREATININE  --    < > 9.68* 10.14* 7.40*  CALCIUM  --    < > 6.8* 7.1* 7.9*  PHOS 9.3*  --   --   --  4.1   < > = values in this interval not displayed.   Recent Labs  Lab 12/16/16 0457 12/17/16 0543 12/18/16 1004  WBC 7.0 6.2 6.2  HGB 7.8* 7.5* 7.6*  HCT 23.3* 22.3* 22.6*  MCV 80.9 81.1 83.7  PLT 175 155 151     Background: 32M with new ESRD (known prior RF, followed [facility]), severe anemia.    Assessment/Plan  1. New ESRD.  s/p R IJ TDC 11/18 VVS, HD #1 11/18 1. For AVF/AVG Friday 2. HD #3 today, then next on Friday 3. [facility] -- he has concerns with transportation and financing  2. Severe anemia 1. 3u pRBC transfusion at HD, total 4 u this admit 2. TSAT 54% and Ferritin 309; B12 replete; likely all related to CKD/ESRD 3. Aranesp 100 QSunday started 11/18 (change to a dialysis day once schedule known) 4. Transfuse additional unit with HD today 3. Secondary HPT w/Hypocalcemia and hyperphosphatemia 1. PTH 767;  2. Calcitriol  1mcg daily for now (consolidate to HD days once clear where unit will be and what days); 3. High Ca bath with HD; Tums BID not as binder and with meals as binder 4. Hypervolemia / LEE improving with fluid removal on HD 5. HTN 6. DM2  Camille Balynthia Mariadelcarmen Corella, MD [facility] [phone] Pager 12/18/2016, 10:40 AM

## 2016-12-19 ENCOUNTER — Inpatient Hospital Stay (HOSPITAL_COMMUNITY): Payer: Non-veteran care

## 2016-12-19 DIAGNOSIS — Z01810 Encounter for preprocedural cardiovascular examination: Secondary | ICD-10-CM

## 2016-12-19 LAB — TYPE AND SCREEN
ABO/RH(D): A POS
ANTIBODY SCREEN: NEGATIVE
UNIT DIVISION: 0

## 2016-12-19 LAB — BPAM RBC
BLOOD PRODUCT EXPIRATION DATE: 201811282359
ISSUE DATE / TIME: 201811211544
Unit Type and Rh: 6200

## 2016-12-19 LAB — GLUCOSE, CAPILLARY
GLUCOSE-CAPILLARY: 69 mg/dL (ref 65–99)
GLUCOSE-CAPILLARY: 95 mg/dL (ref 65–99)
GLUCOSE-CAPILLARY: 117 mg/dL — AB (ref 65–99)
GLUCOSE-CAPILLARY: 125 mg/dL — AB (ref 65–99)

## 2016-12-19 MED ORDER — HYDROXYZINE HCL 10 MG PO TABS
10.0000 mg | ORAL_TABLET | Freq: Four times a day (QID) | ORAL | Status: DC | PRN
  Administered 2016-12-21: 10 mg via ORAL
  Filled 2016-12-19: qty 1

## 2016-12-19 MED ORDER — HYDRALAZINE HCL 10 MG PO TABS
10.0000 mg | ORAL_TABLET | Freq: Three times a day (TID) | ORAL | Status: DC
  Administered 2016-12-19 – 2016-12-21 (×3): 10 mg via ORAL
  Filled 2016-12-19 (×4): qty 1

## 2016-12-19 MED ORDER — CEFAZOLIN SODIUM-DEXTROSE 1-4 GM/50ML-% IV SOLN: 1.0000 g | INTRAVENOUS | Status: DC

## 2016-12-19 MED ORDER — NAPHAZOLINE-PHENIRAMINE 0.025-0.3 % OP SOLN
1.0000 [drp] | Freq: Four times a day (QID) | OPHTHALMIC | Status: DC | PRN
  Administered 2016-12-19 (×3): 1 [drp] via OPHTHALMIC
  Filled 2016-12-19 (×2): qty 5

## 2016-12-19 NOTE — H&P (View-Only) (Signed)
   VASCULAR SURGERY ASSESSMENT & PLAN:   Vein map is still pending.  He is scheduled for a left AV fistula or AV graft tomorrow.  All of his questions were answered.  Preop orders are written.  His hemoglobin is 7.2.  I think he would benefit from preop transfusion at his next dialysis.  SUBJECTIVE:   No specific complaints.  PHYSICAL EXAM:   Vitals:   12/18/16 1713 12/18/16 1830 12/18/16 2055 12/19/16 0513  BP: (!) 174/72 (!) 168/68 (!) 170/78 (!) 189/75  Pulse: 70 76 64 63  Resp: 17 16 16 17   Temp: 98.7 F (37.1 C) 98.5 F (36.9 C) 98.2 F (36.8 C) 98.1 F (36.7 C)  TempSrc: Oral Oral Oral Oral  SpO2: 100% 100% 99% 99%  Weight:   154 lb 5.2 oz (70 kg)   Height:       Palpable left radial pulse.  LABS:   Lab Results  Component Value Date   WBC 6.2 12/18/2016   HGB 7.6 (L) 12/18/2016   HCT 22.6 (L) 12/18/2016   MCV 83.7 12/18/2016   PLT 151 12/18/2016   Lab Results  Component Value Date   CREATININE 7.40 (H) 12/18/2016   Lab Results  Component Value Date   INR 1.33 12/13/2016   CBG (last 3)  Recent Labs    12/18/16 1659 12/18/16 2319 12/19/16 0809  GLUCAP 131* 64* 69    PROBLEM LIST:    Principal Problem:   Anemia due to end stage renal disease (HCC) Active Problems:   Hypertension   Hypocalcemia   ESRD (end stage renal disease) (HCC)   BPH (benign prostatic hyperplasia)   Type II diabetes mellitus with renal manifestations (HCC)   CURRENT MEDS:   . amLODipine  10 mg Oral Daily  . calcitRIOL  1 mcg Oral Daily  . calcium carbonate  800 mg of elemental calcium Oral TID WC  . cholecalciferol  1,000 Units Oral Daily  . cloNIDine  0.2 mg Oral BID  . [START ON 12/23/2016] darbepoetin (ARANESP) injection - DIALYSIS  100 mcg Intravenous Q Mon-HD  . diazepam  1 mg Oral QHS  . heparin  5,000 Units Subcutaneous Q8H  . insulin aspart  0-5 Units Subcutaneous QHS  . insulin aspart  0-9 Units Subcutaneous TID WC  . terazosin  1 mg Oral QHS     Waverly FerrariChristopher Redonna Wilbert Beeper: [phone] Office: [phone] 12/19/2016

## 2016-12-19 NOTE — Progress Notes (Signed)
[facility] Rounding Note Subjective:   Had HD #3 11/21 For permanent access on Friday ( Dr. Edilia Boickson) Transfused 1 unit on HD, has not gotten f/u [facility] issues so no outpt [facility] spot yet (and pt frustrated) Worried about transposition issues (does not drive, lives alone) - told him can ride [facility]AT  11/21 0701 - 11/22 0700 In: 1245.2 [P.O.:940; I.V.:7.2; Blood:298] Out: 4250 [Urine:1250]  Filed Weights   12/18/16 0900 12/18/16 1225 12/18/16 2055  Weight: 73.2 kg (161 lb 6 oz) 70 kg (154 lb 5.2 oz) 70 kg (154 lb 5.2 oz)   Physical Exam:  BP (!) 189/75 (BP Location: Right Arm)   Pulse 63   Temp 98.1 F (36.7 C) (Oral)   Resp 17   Ht 5\' 7"  (1.702 m)   Wt 70 kg (154 lb 5.2 oz)   SpO2 99%   BMI 24.17 kg/m  NAD Weight 73.2->70 last HD TMT Regular. S1S2 No S3 S4  No murmur or rub Lungs clear Abdomen soft and not tender 1+ LE edema improving TDC R IJ  Scheduled Meds: . amLODipine  10 mg Oral Daily  . calcitRIOL  1 mcg Oral Daily  . calcium carbonate  800 mg of elemental calcium Oral TID WC  . cholecalciferol  1,000 Units Oral Daily  . cloNIDine  0.2 mg Oral BID  . [START ON 12/23/2016] darbepoetin (ARANESP) injection - DIALYSIS  100 mcg Intravenous Q Mon-HD  . diazepam  1 mg Oral QHS  . heparin  5,000 Units Subcutaneous Q8H  . insulin aspart  0-5 Units Subcutaneous QHS  . insulin aspart  0-9 Units Subcutaneous TID WC  . terazosin  1 mg Oral QHS   Continuous Infusions: . [START ON 12/20/2016]  ceFAZolin (ANCEF) IV     PRN Meds:.acetaminophen, hydrALAZINE, hydrOXYzine, naphazoline-pheniramine, ondansetron (ZOFRAN) IV, polyethylene glycol, zolpidem  11/17 Renal US 8.5cm b/l with medical renal disease, no obstruction   Ref Range & Units 2d ago  Kappa free light chain 3.3 - 19.4 mg/L 364.1   Lamda free light chains 5.7 - 26.3 mg/L 121.0   Kappa, lamda light chain ratio 0.26 - 1.65 3.01      CBC: Recent Labs    12/17/16 0543 12/18/16 1004  WBC 6.2 6.2  HGB 7.5* 7.6*   HCT 22.3* 22.6*  MCV 81.1 83.7  PLT 155 151   Results for [name] Dudley, [name] (MRN [id number]) as of 12/16/2016 16:44  Ref. Range 12/14/2016 12:32  PTH, Intact Latest Ref Range: 15 - 65 pg/mL 737 (H)   Recent Labs  Lab 12/14/16 0009  12/16/16 0457 12/17/16 0543 12/18/16 1004  NA  --    < > 136 133* 133*  K  --    < > 4.0 4.2 3.5  CL  --    < > 109 106 101  CO2  --    < > 16* 17* 21*  GLUCOSE  --    < > 87 75 161*  BUN  --    < > 73* 76* 47*  CREATININE  --    < > 9.68* 10.14* 7.40*  CALCIUM  --    < > 6.8* 7.1* 7.9*  PHOS 9.3*  --   --   --  4.1   < > = values in this interval not displayed.   Recent Labs  Lab 12/16/16 0457 12/17/16 0543 12/18/16 1004  WBC 7.0 6.2 6.2  HGB 7.8* 7.5* 7.6*  HCT 23.3* 22.3* 22.6*  MCV 80.9 81.1  83.7  PLT 175 155 151     Background: 69M with new ESRD (known prior RF, followed [facility]), severe anemia.    Assessment/Plan  1. New ESRD.  s/p R IJ TDC 11/18 VVS, HD #1 11/18 1. For AVF/AVG Friday 2. HD #4 on Friday 3. [facility] -- he has concerns with [facility] and financing  2. Severe anemia 1. 3u pRBC transfusion at HD, total 4 u this admit 2. TSAT 54% and Ferritin 309; B12 replete; likely all related to CKD/ESRD 3. Aranesp 100 QSunday started 11/18 (change to a dialysis day once schedule known) 4. Transfused additional unit with HD 11/21 and no f/u Hb today; check in AM 3. Secondary HPT w/Hypocalcemia and hyperphosphatemia 1. PTH 767;  2. Calcitriol  1mcg daily for now (consolidate to HD days once clear where unit will be and what days); 3. High Ca bath with HD; Tums BID not as binder and with meals as binder 4. Hypervolemia / LEE improving with fluid removal on HD 5. HTN 6. DM2  Camille Balynthia Remie Mathison, MD [facility] [phone] Pager 12/19/2016, 11:42 AM

## 2016-12-19 NOTE — Progress Notes (Addendum)
CBG is 64. Denies any symptoms. Gave patient juice and snack per patient request. Will continue to monitor.

## 2016-12-19 NOTE — Progress Notes (Signed)
Right  Upper Extremity Vein Map    Cephalic  Segment Diameter Depth Comment  1. Axilla 3.546mm 1.552mm   2. Mid upper arm 2.532mm 1.625mm   3. Above AC 1.88mm 1.362mm   4. In [facility] 2.693mm 1.804mm   5. Below AC 2.643mm 2.143mm branching  6. Mid forearm 3.644mm 2.165mm branching  7. Wrist 3.506mm 1.22mm branching   mm mm    mm mm    mm mm    Basilic  Segment Diameter Depth Comment  1. Axilla 7mm 5.568mm   2. Mid upper arm 1.347mm 4.261mm branching  3. Above AC 4.165mm 5mm branching  4. In [facility] 3.76mm 3.112mm   5. Below AC 1.44mm 0.8402mm   6. Mid forearm 2mm 1.229mm   7. Wrist 2.503mm 1.414mm    mm mm    mm mm    mm mm   Left Upper Extremity Vein Map    Cephalic  Segment Diameter Depth Comment  1. Axilla 3.135mm 2mm   2. Mid upper arm 3.598mm 3mm   3. Above AC 3.836mm 2.432mm   4. In [facility] 3.1106mm 2.133mm   5. Below AC 2.687mm 1.167mm   6. Mid forearm 2.519mm 2mm branching  7. Wrist 2.476mm 2.442mm branching   mm mm    mm mm    mm mm    Basilic  Segment Diameter Depth Comment  1. Axilla mm mm   2. Mid upper arm 5mm 9mm Origin  3. Above AC 5mm 7mm branching  4. In [facility] 3.225mm 1.361mm branching  5. Below AC 2.203mm 2mm   6. Mid forearm 2.302mm 2mm   7. Wrist 1.468mm 1.299mm    mm mm    mm mm    mm mm

## 2016-12-19 NOTE — Progress Notes (Signed)
  Right  Upper Extremity Vein Map    Cephalic  Segment Diameter Depth Comment  1. Axilla 3.276mm 1.362mm   2. Mid upper arm 2.642mm 1.285mm   3. Above AC 1.348mm 1.512mm   4. In [facility] 2.403mm 1.364mm   5. Below AC 2.843mm 2.763mm branching  6. Mid forearm 3.694mm 2.25mm branching  7. Wrist 3.886mm 1.272mm branching   mm mm    mm mm    mm mm    Basilic  Segment Diameter Depth Comment  1. Axilla 7mm 5.718mm   2. Mid upper arm 4.687mm 4.471mm branching  3. Above AC 4.695mm 5mm branching  4. In [facility] 3.676mm 3.192mm   5. Below AC 1.604mm 0.4602mm   6. Mid forearm 2mm 1.419mm   7. Wrist 2.213mm 1.214mm    mm mm    mm mm    mm mm    Left Upper Extremity Vein Map    Cephalic  Segment Diameter Depth Comment  1. Axilla 3.895mm 2mm   2. Mid upper arm 3.548mm 3mm   3. Above AC 3.676mm 2.152mm   4. In [facility] 3.536mm 2.233mm   5. Below AC 2.57mm 1.797mm branching  6. Mid forearm 2.789mm 2mm branching  7. Wrist 2.296mm 2.742mm    mm mm    mm mm    mm mm    Basilic  Segment Diameter Depth Comment  1. Axilla mm mm   2. Mid upper arm 5mm 9mm Origin  3. Above AC 5mm 7mm branching  4. In [facility] 3.575mm 1.471mm branching  5. Below AC 2.433mm 2mm   6. Mid forearm 2.342mm 2mm   7. Wrist 1.698mm 1.249mm    mm mm    mm mm    mm mm

## 2016-12-19 NOTE — Progress Notes (Signed)
PROGRESS NOTE [facility]   [place]ooringsportSylvester Dudley   MVH:[id number]:[id number] DOB: [birth year]  DOA: 12/13/2016 [facility]   Brief Narrative:  [name] LemonsSylvester Dudley  is a 71 year old male with CKD stage IV, hypertension, diabetes, tobacco abuse, BPH presented with shortness of breath, peripheral edema.  Patient follows nephrology was recommended dialysis but he had opted not to. On admission patient presented with shortness of breath, progressively getting worse, worsening bilateral leg edema, found to have hemoglobin of 4.1. Patient was admitted for further workup. Patient was transfused 4 units of PRBC's. Nephrology was consulted and was started on HD.   Subjective: Today c/o itchy and burning eyes. No other concerns   Assessment & Plan: Anemia due to end stage renal disease (HCC) Hemoglobin 4.1 at the time of admission, FOBT negative, likely due to anemia of renal disease Hgb stable, getting 1 unit PRBC's for a total of 5 units during admission  Anemia profile consistent with anemia of chronic disease Continue ESA with hemodialysis  CKD stage IV, now end-stage renal disease, peripheral edema, dyspnea Nephrology and vascular surgery consulted, Renal ultrasound showed no evidence of hydronephrosis, increased renal parenchymal echogenicity TDC placed 11/18, started hemodialysis 11/18 (#1), receiving hemodialysis #2 11/20, hemodialysis #3 today.  For permanent assess on Friday  Awaiting for CLIP OP dialysis center,   Hypertension -  BP still elevated on clonidine and  Norvasc to 10 mg daily  Hydralazine 10 mg TID, added  Continue current regimen   Hypocalcemia Continue management per nephro   BPH Continue terazosin  Type II diabetes mellitus with renal manifestations (HCC) Hemoglobin A1c 5.2 CBGs controlled, continue siding scale insulin  DVT prophylaxis: Hep sq Code Status: Full  Family Communication: None at bedside  Disposition Plan: Awaiting OP HD center     Consultants:   Nephrology   Vascular surgery   Procedures:   None   Antimicrobials:  None     Objective: Vitals:   12/18/16 1713 12/18/16 1830 12/18/16 2055 12/19/16 0513  BP: (!) 174/72 (!) 168/68 (!) 170/78 (!) 189/75  Pulse: 70 76 64 63  Resp: 17 16 16 17   Temp: 98.7 F (37.1 C) 98.5 F (36.9 C) 98.2 F (36.8 C) 98.1 F (36.7 C)  TempSrc: Oral Oral Oral Oral  SpO2: 100% 100% 99% 99%  Weight:   70 kg (154 lb 5.2 oz)   Height:        Intake/Output Summary (Last 24 hours) at 12/19/2016 0917 Last data filed at 12/19/2016 0515 Gross per 24 hour  Intake 1245.17 ml  Output 4050 ml  Net -2804.83 ml   Filed Weights   12/18/16 0900 12/18/16 1225 12/18/16 2055  Weight: 73.2 kg (161 lb 6 oz) 70 kg (154 lb 5.2 oz) 70 kg (154 lb 5.2 oz)    Examination: No changes in physical exam from 11/21  General exam: Appears calm and comfortable  Eyes: No erythema, PERRL, No discharge. Normal conjunctiva  Respiratory system: Clear to auscultation. No wheezes,crackle or rhonchi Cardiovascular system: S1 & S2 heard, RRR. No JVD, murmurs, rubs or gallops Gastrointestinal system: Abdomen is nondistended, soft and nontender.  Central nervous system: Alert and oriented. Extremities: No LE edema.  Skin: No rashes, lesions or ulcers Psychiatry: Mood & affect appropriate.    Data Reviewed: I have personally reviewed following labs and imaging studies  CBC: Recent Labs  Lab 12/14/16 1232 12/14/16 1930 12/15/16 0344 12/16/16 0457 12/17/16 0543 12/18/16 1004  WBC 5.7  --  5.9 7.0 6.2 6.2  HGB 6.5* 8.1* 7.7* 7.8* 7.5* 7.6*  HCT 19.5* 24.3* 22.5* 23.3* 22.3* 22.6*  MCV 79.9  --  80.9 80.9 81.1 83.7  PLT 178  --  187 175 155 151   Basic Metabolic Panel: Recent Labs  Lab 12/14/16 0009  12/15/16 0047 12/15/16 0344 12/16/16 0457 12/17/16 0543 12/18/16 1004  NA  --    < > 135 136 136 133* 133*  K  --    < > 4.5 4.7 4.0 4.2 3.5  CL  --    < > 110 112* 109 106 101  CO2   --    < > 13* 12* 16* 17* 21*  GLUCOSE  --    < > 99 80 87 75 161*  BUN  --    < > 100* 101* 73* 76* 47*  CREATININE  --    < > 13.11* 13.10* 9.68* 10.14* 7.40*  CALCIUM  --    < > 6.5* 6.6* 6.8* 7.1* 7.9*  PHOS 9.3*  --   --   --   --   --  4.1   < > = values in this interval not displayed.   GFR: Estimated Creatinine Clearance: 8.6 mL/min (A) (by C-G formula based on SCr of 7.4 mg/dL (H)). Liver Function Tests: Recent Labs  Lab 12/14/16 0009 12/15/16 0344 12/18/16 1004  ALBUMIN 2.6* 2.5* 2.4*   No results for input(s): LIPASE, AMYLASE in the last 168 hours. No results for input(s): AMMONIA in the last 168 hours. Coagulation Profile: Recent Labs  Lab 12/13/16 2116  INR 1.33   Cardiac Enzymes: No results for input(s): CKTOTAL, CKMB, CKMBINDEX, TROPONINI in the last 168 hours. BNP (last 3 results) No results for input(s): PROBNP in the last 8760 hours. HbA1C: No results for input(s): HGBA1C in the last 72 hours. CBG: Recent Labs  Lab 12/17/16 2116 12/18/16 0756 12/18/16 1659 12/18/16 2319 12/19/16 0809  GLUCAP 99 121* 131* 64* 69   Lipid Profile: No results for input(s): CHOL, HDL, LDLCALC, TRIG, CHOLHDL, LDLDIRECT in the last 72 hours. Thyroid Function Tests: No results for input(s): TSH, T4TOTAL, FREET4, T3FREE, THYROIDAB in the last 72 hours. Anemia Panel: No results for input(s): VITAMINB12, FOLATE, FERRITIN, TIBC, IRON, RETICCTPCT in the last 72 hours. Sepsis Labs: No results for input(s): PROCALCITON, LATICACIDVEN in the last 168 hours.  Recent Results (from the past 240 hour(s))  MRSA PCR Screening     Status: None   Collection Time: 12/14/16  3:22 AM  Result Value Ref Range Status   MRSA by PCR NEGATIVE NEGATIVE Final    Comment:        The GeneXpert MRSA Assay (FDA approved for NASAL specimens only), is one component of a comprehensive MRSA colonization surveillance program. It is not intended to diagnose MRSA infection nor to guide or monitor  treatment for MRSA infections.   Surgical pcr screen     Status: None   Collection Time: 12/15/16  6:02 AM  Result Value Ref Range Status   MRSA, PCR NEGATIVE NEGATIVE Final   Staphylococcus aureus NEGATIVE NEGATIVE Final    Comment: (NOTE) The Xpert SA Assay (FDA approved for NASAL specimens in patients 47 years of age and older), is one component of a comprehensive surveillance program. It is not intended to diagnose infection nor to guide or monitor treatment.       Radiology Studies: No results found.    Scheduled Meds: . amLODipine  10 mg Oral Daily  . calcitRIOL  1  mcg Oral Daily  . calcium carbonate  800 mg of elemental calcium Oral TID WC  . cholecalciferol  1,000 Units Oral Daily  . cloNIDine  0.2 mg Oral BID  . [START ON 12/23/2016] darbepoetin (ARANESP) injection - DIALYSIS  100 mcg Intravenous Q Mon-HD  . diazepam  1 mg Oral QHS  . heparin  5,000 Units Subcutaneous Q8H  . insulin aspart  0-5 Units Subcutaneous QHS  . insulin aspart  0-9 Units Subcutaneous TID WC  . terazosin  1 mg Oral QHS   Continuous Infusions: . [START ON 12/20/2016]  ceFAZolin (ANCEF) IV       LOS: 6 days    Time spent: Total of 15 minutes spent with pt, greater than 50% of which was spent in discussion of  treatment, counseling and coordination of care  Latrelle DodrillEdwin Silva, MD Pager: Text Page via [email or web address]   If 7PM-7AM, please contact night-coverage [email or web address] 12/19/2016, 9:17 AM

## 2016-12-19 NOTE — Progress Notes (Signed)
   VASCULAR SURGERY ASSESSMENT & PLAN:   Vein map is still pending.  He is scheduled for a left AV fistula or AV graft tomorrow.  All of his questions were answered.  Preop orders are written.  His hemoglobin is 7.2.  I think he would benefit from preop transfusion at his next dialysis.  SUBJECTIVE:   No specific complaints.  PHYSICAL EXAM:   Vitals:   12/18/16 1713 12/18/16 1830 12/18/16 2055 12/19/16 0513  BP: (!) 174/72 (!) 168/68 (!) 170/78 (!) 189/75  Pulse: 70 76 64 63  Resp: 17 16 16 17  Temp: 98.7 F (37.1 C) 98.5 F (36.9 C) 98.2 F (36.8 C) 98.1 F (36.7 C)  TempSrc: Oral Oral Oral Oral  SpO2: 100% 100% 99% 99%  Weight:   154 lb 5.2 oz (70 kg)   Height:       Palpable left radial pulse.  LABS:   Lab Results  Component Value Date   WBC 6.2 12/18/2016   HGB 7.6 (L) 12/18/2016   HCT 22.6 (L) 12/18/2016   MCV 83.7 12/18/2016   PLT 151 12/18/2016   Lab Results  Component Value Date   CREATININE 7.40 (H) 12/18/2016   Lab Results  Component Value Date   INR 1.33 12/13/2016   CBG (last 3)  Recent Labs    12/18/16 1659 12/18/16 2319 12/19/16 0809  GLUCAP 131* 64* 69    PROBLEM LIST:    Principal Problem:   Anemia due to end stage renal disease (HCC) Active Problems:   Hypertension   Hypocalcemia   ESRD (end stage renal disease) (HCC)   BPH (benign prostatic hyperplasia)   Type II diabetes mellitus with renal manifestations (HCC)   CURRENT MEDS:   . amLODipine  10 mg Oral Daily  . calcitRIOL  1 mcg Oral Daily  . calcium carbonate  800 mg of elemental calcium Oral TID WC  . cholecalciferol  1,000 Units Oral Daily  . cloNIDine  0.2 mg Oral BID  . [START ON 12/23/2016] darbepoetin (ARANESP) injection - DIALYSIS  100 mcg Intravenous Q Mon-HD  . diazepam  1 mg Oral QHS  . heparin  5,000 Units Subcutaneous Q8H  . insulin aspart  0-5 Units Subcutaneous QHS  . insulin aspart  0-9 Units Subcutaneous TID WC  . terazosin  1 mg Oral QHS     [name] Dudley Beeper: [phone] Office: [phone] 12/19/2016  

## 2016-12-20 ENCOUNTER — Inpatient Hospital Stay (HOSPITAL_COMMUNITY): Payer: Non-veteran care | Admitting: Certified Registered"

## 2016-12-20 ENCOUNTER — Encounter (HOSPITAL_COMMUNITY)
Admission: EM | Discharge status: Against Medical Advice | Payer: Self-pay | Source: Home / Self Care | Attending: Internal Medicine

## 2016-12-20 ENCOUNTER — Encounter (HOSPITAL_COMMUNITY): Payer: Self-pay | Admitting: *Deleted

## 2016-12-20 HISTORY — PX: AV FISTULA PLACEMENT: SHX1204

## 2016-12-20 LAB — PROTIME-INR
INR: 1.04
PROTHROMBIN TIME: 13.5 s (ref 11.4–15.2)

## 2016-12-20 LAB — RENAL FUNCTION PANEL
ALBUMIN: 2.6 g/dL — AB (ref 3.5–5.0)
Anion gap: 10 (ref 5–15)
BUN: 44 mg/dL — ABNORMAL HIGH (ref 6–20)
CALCIUM: 8.7 mg/dL — AB (ref 8.9–10.3)
CO2: 23 mmol/L (ref 22–32)
CREATININE: 6.52 mg/dL — AB (ref 0.61–1.24)
Chloride: 99 mmol/L — ABNORMAL LOW (ref 101–111)
GFR, EST AFRICAN AMERICAN: 9 mL/min — AB (ref >60)
GFR, EST NON AFRICAN AMERICAN: 8 mL/min — AB (ref >60)
Glucose, Bld: 90 mg/dL (ref 65–99)
Phosphorus: 4.1 mg/dL (ref 2.5–4.6)
Potassium: 4.3 mmol/L (ref 3.5–5.1)
SODIUM: 132 mmol/L — AB (ref 135–145)

## 2016-12-20 LAB — GLUCOSE, CAPILLARY
GLUCOSE-CAPILLARY: 86 mg/dL (ref 65–99)
GLUCOSE-CAPILLARY: 204 mg/dL — AB (ref 65–99)
Glucose-Capillary: 83 mg/dL (ref 65–99)

## 2016-12-20 LAB — CBC
HCT: 26.4 % — ABNORMAL LOW (ref 39.0–52.0)
Hemoglobin: 8.7 g/dL — ABNORMAL LOW (ref 13.0–17.0)
MCH: 27.9 pg (ref 26.0–34.0)
MCHC: 33 g/dL (ref 30.0–36.0)
MCV: 84.6 fL (ref 78.0–100.0)
PLATELETS: 149 10*3/uL — AB (ref 150–400)
RBC: 3.12 MIL/uL — AB (ref 4.22–5.81)
RDW: 16.8 % — ABNORMAL HIGH (ref 11.5–15.5)
WBC: 6.1 10*3/uL (ref 4.0–10.5)

## 2016-12-20 LAB — SURGICAL PCR SCREEN
MRSA, PCR: NEGATIVE
STAPHYLOCOCCUS AUREUS: NEGATIVE

## 2016-12-20 SURGERY — ARTERIOVENOUS (AV) FISTULA CREATION
Anesthesia: General | Site: Arm Lower | Laterality: Left

## 2016-12-20 MED ORDER — PHENYLEPHRINE HCL 10 MG/ML IJ SOLN
INTRAMUSCULAR | Status: DC | PRN
  Administered 2016-12-20 (×2): 40 ug via INTRAVENOUS
  Administered 2016-12-20: 120 ug via INTRAVENOUS

## 2016-12-20 MED ORDER — SODIUM CHLORIDE 0.9 % IV SOLN
INTRAVENOUS | Status: DC | PRN
  Administered 2016-12-20: 13:00:00

## 2016-12-20 MED ORDER — PROPOFOL 10 MG/ML IV BOLUS
INTRAVENOUS | Status: AC
  Filled 2016-12-20: qty 20

## 2016-12-20 MED ORDER — FENTANYL CITRATE (PF) 100 MCG/2ML IJ SOLN: 25.0000 ug | INTRAMUSCULAR | Status: DC | PRN

## 2016-12-20 MED ORDER — PAPAVERINE HCL 30 MG/ML IJ SOLN
INTRAMUSCULAR | Status: AC
  Filled 2016-12-20: qty 2

## 2016-12-20 MED ORDER — LIDOCAINE HCL (PF) 1 % IJ SOLN
INTRAMUSCULAR | Status: DC | PRN
  Administered 2016-12-20: 20 mL

## 2016-12-20 MED ORDER — HEPARIN SODIUM (PORCINE) 1000 UNIT/ML IJ SOLN
INTRAMUSCULAR | Status: DC | PRN
  Administered 2016-12-20: 6000 [IU] via INTRAVENOUS

## 2016-12-20 MED ORDER — ONDANSETRON HCL 4 MG/2ML IJ SOLN: 4.0000 mg | Freq: Once | INTRAMUSCULAR | Status: DC | PRN

## 2016-12-20 MED ORDER — LIDOCAINE 2% (20 MG/ML) 5 ML SYRINGE
INTRAMUSCULAR | Status: AC
  Filled 2016-12-20: qty 5

## 2016-12-20 MED ORDER — HEPARIN SODIUM (PORCINE) 1000 UNIT/ML IJ SOLN
INTRAMUSCULAR | Status: AC
  Filled 2016-12-20: qty 4

## 2016-12-20 MED ORDER — PAPAVERINE HCL 30 MG/ML IJ SOLN
INTRAMUSCULAR | Status: DC | PRN
  Administered 2016-12-20: 60 mg via INTRAVENOUS

## 2016-12-20 MED ORDER — PROPOFOL 10 MG/ML IV BOLUS
INTRAVENOUS | Status: DC | PRN
  Administered 2016-12-20: 120 mg via INTRAVENOUS

## 2016-12-20 MED ORDER — SODIUM CHLORIDE 0.9 % IV SOLN
INTRAVENOUS | Status: DC
  Administered 2016-12-20: 11:00:00 via INTRAVENOUS

## 2016-12-20 MED ORDER — ONDANSETRON HCL 4 MG/2ML IJ SOLN
INTRAMUSCULAR | Status: DC | PRN
  Administered 2016-12-20: 4 mg via INTRAVENOUS

## 2016-12-20 MED ORDER — OXYCODONE-ACETAMINOPHEN 5-325 MG PO TABS
1.0000 | ORAL_TABLET | Freq: Four times a day (QID) | ORAL | Status: DC | PRN
  Administered 2016-12-21: 1 via ORAL
  Filled 2016-12-20: qty 1

## 2016-12-20 MED ORDER — 0.9 % SODIUM CHLORIDE (POUR BTL) OPTIME
TOPICAL | Status: DC | PRN
  Administered 2016-12-20: 1000 mL

## 2016-12-20 MED ORDER — FENTANYL CITRATE (PF) 250 MCG/5ML IJ SOLN
INTRAMUSCULAR | Status: AC
Start: 2016-12-20 — End: 2016-12-20
  Filled 2016-12-20: qty 5

## 2016-12-20 MED ORDER — CEFAZOLIN SODIUM-DEXTROSE 2-3 GM-%(50ML) IV SOLR
INTRAVENOUS | Status: DC | PRN
  Administered 2016-12-20: 2 g via INTRAVENOUS

## 2016-12-20 MED ORDER — FENTANYL CITRATE (PF) 100 MCG/2ML IJ SOLN
INTRAMUSCULAR | Status: DC | PRN
  Administered 2016-12-20 (×2): 50 ug via INTRAVENOUS

## 2016-12-20 MED ORDER — LIDOCAINE HCL 1 % IJ SOLN
INTRAMUSCULAR | Status: AC
  Filled 2016-12-20: qty 20

## 2016-12-20 MED ORDER — LIDOCAINE HCL (CARDIAC) 20 MG/ML IV SOLN
INTRAVENOUS | Status: DC | PRN
  Administered 2016-12-20: 40 mg via INTRAVENOUS

## 2016-12-20 MED ORDER — LIDOCAINE-EPINEPHRINE (PF) 1 %-1:200000 IJ SOLN
INTRAMUSCULAR | Status: AC
  Filled 2016-12-20: qty 30

## 2016-12-20 MED ORDER — PROTAMINE SULFATE 10 MG/ML IV SOLN
INTRAVENOUS | Status: DC | PRN
  Administered 2016-12-20: 40 mg via INTRAVENOUS

## 2016-12-20 SURGICAL SUPPLY — 31 items
ARMBAND PINK RESTRICT EXTREMIT (MISCELLANEOUS) ×2 IMPLANT
CANISTER SUCT 3000ML PPV (MISCELLANEOUS) ×2 IMPLANT
CANNULA VESSEL 3MM 2 BLNT TIP (CANNULA) ×2 IMPLANT
CLIP VESOCCLUDE MED 6/CT (CLIP) ×2 IMPLANT
CLIP VESOCCLUDE SM WIDE 6/CT (CLIP) ×8 IMPLANT
COVER PROBE W GEL 5X96 (DRAPES) IMPLANT
DECANTER SPIKE VIAL GLASS SM (MISCELLANEOUS) ×2 IMPLANT
DERMABOND ADVANCED (GAUZE/BANDAGES/DRESSINGS) ×1
DERMABOND ADVANCED .7 DNX12 (GAUZE/BANDAGES/DRESSINGS) ×1 IMPLANT
ELECT REM PT RETURN 9FT ADLT (ELECTROSURGICAL) ×2
ELECTRODE REM PT RTRN 9FT ADLT (ELECTROSURGICAL) ×1 IMPLANT
GLOVE BIO SURGEON STRL SZ7.5 (GLOVE) ×2 IMPLANT
GLOVE BIOGEL PI IND STRL 8 (GLOVE) ×1 IMPLANT
GLOVE BIOGEL PI INDICATOR 8 (GLOVE) ×1
GOWN STRL REUS W/ TWL LRG LVL3 (GOWN DISPOSABLE) ×2 IMPLANT
GOWN STRL REUS W/TWL LRG LVL3 (GOWN DISPOSABLE) ×2
KIT BASIN OR (CUSTOM PROCEDURE TRAY) ×2 IMPLANT
KIT ROOM TURNOVER OR (KITS) ×2 IMPLANT
NEEDLE HYPO 25GX1X1/2 BEV (NEEDLE) ×2 IMPLANT
NS IRRIG 1000ML POUR BTL (IV SOLUTION) ×2 IMPLANT
PACK CV ACCESS (CUSTOM PROCEDURE TRAY) ×2 IMPLANT
PAD ARMBOARD 7.5X6 YLW CONV (MISCELLANEOUS) ×4 IMPLANT
SPONGE SURGIFOAM ABS GEL 100 (HEMOSTASIS) IMPLANT
SUT PROLENE 6 0 BV (SUTURE) ×2 IMPLANT
SUT SILK 3 0 (SUTURE) ×1
SUT SILK 3-0 18XBRD TIE 12 (SUTURE) ×1 IMPLANT
SUT VIC AB 3-0 SH 27 (SUTURE) ×1
SUT VIC AB 3-0 SH 27X BRD (SUTURE) ×1 IMPLANT
SUT VICRYL 4-0 PS2 18IN ABS (SUTURE) ×2 IMPLANT
UNDERPAD 30X30 (UNDERPADS AND DIAPERS) ×2 IMPLANT
WATER STERILE IRR 1000ML POUR (IV SOLUTION) ×2 IMPLANT

## 2016-12-20 NOTE — Anesthesia Postprocedure Evaluation (Signed)
Anesthesia Post Note  Patient: [name] LemonsSylvester Fedele  Procedure(s) Performed: ARTERIOVENOUS (AV) FISTULA CREATION (Left Arm Lower)     Patient location during evaluation: PACU Anesthesia Type: General Level of consciousness: awake, awake and alert and oriented Pain management: pain level controlled Vital Signs Assessment: post-procedure vital signs reviewed and stable Respiratory status: spontaneous breathing, nonlabored ventilation and respiratory function stable Cardiovascular status: blood pressure returned to baseline Anesthetic complications: no    Last Vitals:  Vitals:   12/20/16 1450 12/20/16 1454  BP:  (!) 142/65  Pulse: (!) 56 (!) 53  Resp: (!) 9 15  Temp:  (!) 36.3 C  SpO2: 96% 95%    Last Pain:  Vitals:   12/20/16 1456  TempSrc:   PainSc: 0-No pain                 Calli Bashor COKER

## 2016-12-20 NOTE — Progress Notes (Signed)
PROGRESS NOTE [facility]   StarksSylvester Dudley   ZOX:[id number] DOB: 1945-03-16  DOA: 12/13/2016 [facility]   Brief Narrative:  [name] Dudley  is a 71 year old male with CKD stage IV, hypertension, diabetes, tobacco abuse, BPH presented with shortness of breath, peripheral edema.  Patient follows nephrology was recommended dialysis but he had opted not to. On admission patient presented with shortness of breath, progressively getting worse, worsening bilateral leg edema, found to have hemoglobin of 4.1. Patient was admitted for further workup. Patient was transfused 4 units of PRBC's. Nephrology was consulted and was started on HD.   Subjective: Patient upset, wants to go home, for permanent access today. No acute events overnight   Assessment & Plan: Anemia due to end stage renal disease (HCC) Hemoglobin 4.1 at the time of admission, FOBT negative, likely due to anemia of renal disease Hgb stable, getting 1 unit PRBC's for a total of 5 units during admission  Anemia profile consistent with anemia of chronic disease Hgb up to 8.7 Continue ESA with hemodialysis  New ESRD  Nephrology and vascular surgery following  Renal ultrasound showed no evidence of hydronephrosis, increased renal parenchymal echogenicity TDC placed 11/18, started hemodialysis 11/18 (#1), receiving hemodialysis #2 11/20, hemodialysis #3 today.  Permanent assess today  Awaiting for CLIP OP dialysis center, unable to find one yet.  Will get HD after surgery today   Hypertension -  BP still in the high side, was started in hydralazine last night, he is also on clonidine and Norvasc. He will get HD today will monitor BP and re-check after HD, hopefully this help stabilizing BP, if no improvement will increase clonidine.   Secondary PTH w/ Hypocalcemia Continue management per nephro   BPH Continue terazosin  Type II diabetes mellitus with renal manifestations (HCC) Hemoglobin A1c  5.2 CBGs controlled, continue siding scale insulin  DVT prophylaxis: Hep sq Code Status: Full  Family Communication: None at bedside  Disposition Plan: Awaiting OP HD center    Consultants:   Nephrology   Vascular surgery   Procedures:   None   Antimicrobials:  None     Objective: Vitals:   12/19/16 2120 12/20/16 0519 12/20/16 0842 12/20/16 1031  BP: (!) 176/73 (!) 170/87 (!) 177/87   Pulse: 70 63 60   Resp: 20 16 18    Temp: 98.5 F (36.9 C) 98.5 F (36.9 C) 98.3 F (36.8 C)   TempSrc: Oral Oral Oral   SpO2: 97% 95% 97%   Weight:    69.9 kg (154 lb)  Height:    5\' 7"  (1.702 m)    Intake/Output Summary (Last 24 hours) at 12/20/2016 1300 Last data filed at 12/20/2016 0700 Gross per 24 hour  Intake 680 ml  Output 775 ml  Net -95 ml   Filed Weights   12/18/16 1225 12/18/16 2055 12/20/16 1031  Weight: 70 kg (154 lb 5.2 oz) 70 kg (154 lb 5.2 oz) 69.9 kg (154 lb)    Examination:   General: NAD, upset  Cardiovascular: RRR, S1/S2 +, no rubs, no gallops Respiratory: CTA bilaterally, no wheezing, no rhonchi Abdominal: Soft, NT, ND, bowel sounds + Extremities: no edema  Data Reviewed: I have personally reviewed following labs and imaging studies  CBC: Recent Labs  Lab 12/15/16 0344 12/16/16 0457 12/17/16 0543 12/18/16 1004 12/20/16 0709  WBC 5.9 7.0 6.2 6.2 6.1  HGB 7.7* 7.8* 7.5* 7.6* 8.7*  HCT 22.5* 23.3* 22.3* 22.6* 26.4*  MCV 80.9 80.9 81.1 83.7 84.6  PLT  187 175 155 151 149*   Basic Metabolic Panel: Recent Labs  Lab 12/14/16 0009  12/15/16 0344 12/16/16 0457 12/17/16 0543 12/18/16 1004 12/20/16 0709  NA  --    < > 136 136 133* 133* 132*  K  --    < > 4.7 4.0 4.2 3.5 4.3  CL  --    < > 112* 109 106 101 99*  CO2  --    < > 12* 16* 17* 21* 23  GLUCOSE  --    < > 80 87 75 161* 90  BUN  --    < > 101* 73* 76* 47* 44*  CREATININE  --    < > 13.10* 9.68* 10.14* 7.40* 6.52*  CALCIUM  --    < > 6.6* 6.8* 7.1* 7.9* 8.7*  PHOS 9.3*  --   --    --   --  4.1 4.1   < > = values in this interval not displayed.   GFR: Estimated Creatinine Clearance: 9.7 mL/min (A) (by C-G formula based on SCr of 6.52 mg/dL (H)). Liver Function Tests: Recent Labs  Lab 12/14/16 0009 12/15/16 0344 12/18/16 1004 12/20/16 0709  ALBUMIN 2.6* 2.5* 2.4* 2.6*   No results for input(s): LIPASE, AMYLASE in the last 168 hours. No results for input(s): AMMONIA in the last 168 hours. Coagulation Profile: Recent Labs  Lab 12/13/16 2116 12/20/16 0709  INR 1.33 1.04   Cardiac Enzymes: No results for input(s): CKTOTAL, CKMB, CKMBINDEX, TROPONINI in the last 168 hours. BNP (last 3 results) No results for input(s): PROBNP in the last 8760 hours. HbA1C: No results for input(s): HGBA1C in the last 72 hours. CBG: Recent Labs  Lab 12/19/16 0809 12/19/16 1212 12/19/16 1722 12/19/16 2116 12/20/16 0812  GLUCAP 69 95 117* 125* 83   Lipid Profile: No results for input(s): CHOL, HDL, LDLCALC, TRIG, CHOLHDL, LDLDIRECT in the last 72 hours. Thyroid Function Tests: No results for input(s): TSH, T4TOTAL, FREET4, T3FREE, THYROIDAB in the last 72 hours. Anemia Panel: No results for input(s): VITAMINB12, FOLATE, FERRITIN, TIBC, IRON, RETICCTPCT in the last 72 hours. Sepsis Labs: No results for input(s): PROCALCITON, LATICACIDVEN in the last 168 hours.  Recent Results (from the past 240 hour(s))  MRSA PCR Screening     Status: None   Collection Time: 12/14/16  3:22 AM  Result Value Ref Range Status   MRSA by PCR NEGATIVE NEGATIVE Final    Comment:        The GeneXpert MRSA Assay (FDA approved for NASAL specimens only), is one component of a comprehensive MRSA colonization surveillance program. It is not intended to diagnose MRSA infection nor to guide or monitor treatment for MRSA infections.   Surgical pcr screen     Status: None   Collection Time: 12/15/16  6:02 AM  Result Value Ref Range Status   MRSA, PCR NEGATIVE NEGATIVE Final    Staphylococcus aureus NEGATIVE NEGATIVE Final    Comment: (NOTE) The Xpert SA Assay (FDA approved for NASAL specimens in patients 38 years of age and older), is one component of a comprehensive surveillance program. It is not intended to diagnose infection nor to guide or monitor treatment.   Surgical pcr screen     Status: None   Collection Time: 12/19/16 11:17 PM  Result Value Ref Range Status   MRSA, PCR NEGATIVE NEGATIVE Final   Staphylococcus aureus NEGATIVE NEGATIVE Final    Comment: (NOTE) The Xpert SA Assay (FDA approved for NASAL specimens in patients  71 years of age and older), is one component of a comprehensive surveillance program. It is not intended to diagnose infection nor to guide or monitor treatment.       Radiology Studies: No results found.    Scheduled Meds: . [MAR Hold] amLODipine  10 mg Oral Daily  . [MAR Hold] calcitRIOL  1 mcg Oral Daily  . [MAR Hold] calcium carbonate  800 mg of elemental calcium Oral TID WC  . [MAR Hold] cholecalciferol  1,000 Units Oral Daily  . [MAR Hold] cloNIDine  0.2 mg Oral BID  . [MAR Hold] darbepoetin (ARANESP) injection - DIALYSIS  100 mcg Intravenous Q Mon-HD  . [MAR Hold] diazepam  1 mg Oral QHS  . [MAR Hold] heparin  5,000 Units Subcutaneous Q8H  . [MAR Hold] hydrALAZINE  10 mg Oral Q8H  . [MAR Hold] insulin aspart  0-5 Units Subcutaneous QHS  . [MAR Hold] insulin aspart  0-9 Units Subcutaneous TID WC  . [MAR Hold] terazosin  1 mg Oral QHS   Continuous Infusions: . sodium chloride 10 mL/hr at 12/20/16 1048  .  ceFAZolin (ANCEF) IV       LOS: 7 days    Time spent: Total of 15 minutes spent with pt, greater than 50% of which was spent in discussion of  treatment, counseling and coordination of care  Latrelle DodrillEdwin Silva, MD Pager: Text Page via [email or web address]   If 7PM-7AM, please contact night-coverage [email or web address] 12/20/2016, 1:00 PM

## 2016-12-20 NOTE — Op Note (Signed)
    NAME: [name] Dudley    MRN: [id number] DOB: December 23, 1945    DATE OF OPERATION: 12/20/2016  PREOP DIAGNOSIS:    End-stage renal disease  POSTOP DIAGNOSIS:    Same  PROCEDURE:    Left radiocephalic AV fistula  SURGEON: Di Kindlehristopher S. Edilia Boickson, MD, FACS  ASSIST: Doreatha MassedSamantha Rhyne, PA  ANESTHESIA: General  EBL: Minimal  INDICATIONS:    [name] Dudley is a 71 y.o. male who presents for new access.  He has a functioning dialysis catheter.  FINDINGS:   3 mm forearm cephalic vein he also has a decent upper arm cephalic vein.  TECHNIQUE:   The patient was taken to the operating room and received a general anesthetic.  The left arm was prepped and draped in the usual sterile fashion.  The vein was lateral and therefore made 2 separate incisions.  A longitudinal incision was made over the cephalic vein.  Multiple small branches were divided between clips and 3-0 silk ties.  The vein was ligated distally and irrigated up with heparinized saline.  It was a 3 mm vein.  A separate longitudinal incision was made over the radial artery.  The patient was heparinized.  The artery was clamped proximally and distally and a longitudinal arteriotomy was made.  The vein was spatulated and sewn end-to-side to the artery using continuous 6-0 Prolene suture.  At the completion was a good thrill in the fistula.  The heparin was partially reversed with protamine.  The wound was closed with a deep layer of 3-0 Vicryl the skin closed with 4-0 Vicryl.  Dermabond was applied.  The patient tolerated the procedure well was transferred to the recovery room in stable condition.  All needle and sponge counts were correct.  [name] Ferrarihristopher Jamaree Hosier, MD, FACS [facility]  DATE OF DICTATION:   12/20/2016

## 2016-12-20 NOTE — Progress Notes (Signed)
Pt has returned from HD and has begun putting on his clothes stating he has "to go home to get to his pets". Pt states he "I am leaving tonight on the [employer] bus". Patient educated regarding his medical necessity to have his HD center in place before leaving the hospital Page to Dr. Pixie CasinoJ. Kim for notification. Dondra SpryMoore, Nataleah Scioneaux Islee, RN

## 2016-12-20 NOTE — Anesthesia Preprocedure Evaluation (Signed)
Anesthesia Evaluation  Patient identified by MRN, date of birth, ID band Patient awake    Reviewed: Allergy & Precautions, NPO status , Patient's Chart, lab work & pertinent test results  Airway Mallampati: II   Neck ROM: Full    Dental   Pulmonary    breath sounds clear to auscultation       Cardiovascular hypertension,  Rhythm:Regular Rate:Normal     Neuro/Psych    GI/Hepatic   Endo/Other  diabetes  Renal/GU      Musculoskeletal   Abdominal   Peds  Hematology   Anesthesia Other Findings   Reproductive/Obstetrics                             Anesthesia Physical Anesthesia Plan  ASA: III  Anesthesia Plan: General   Post-op Pain Management:    Induction: Intravenous  PONV Risk Score and Plan: Ondansetron and Metaclopromide  Airway Management Planned: LMA  Additional Equipment:   Intra-op Plan:   Post-operative Plan:   Informed Consent: I have reviewed the patients History and Physical, chart, labs and discussed the procedure including the risks, benefits and alternatives for the proposed anesthesia with the patient or authorized representative who has indicated his/her understanding and acceptance.   Dental advisory given  Plan Discussed with: CRNA and Anesthesiologist  Anesthesia Plan Comments:         Anesthesia Quick Evaluation

## 2016-12-20 NOTE — Transfer of Care (Signed)
Immediate Anesthesia Transfer of Care Note  Patient: [name] Dudley  Procedure(s) Performed: ARTERIOVENOUS (AV) FISTULA CREATION (Left Arm Lower)  Patient Location: PACU  Anesthesia Type:General  Level of Consciousness: awake and patient cooperative  Airway & Oxygen Therapy: Patient Spontanous Breathing  Post-op Assessment: Report given to RN and Post -op Vital signs reviewed and stable  Post vital signs: Reviewed and stable  Last Vitals:  Vitals:   12/20/16 0519 12/20/16 0842  BP: (!) 170/87 (!) 177/87  Pulse: 63 60  Resp: 16 18  Temp: 36.9 C 36.8 C  SpO2: 95% 97%    Last Pain:  Vitals:   12/20/16 0842  TempSrc: Oral  PainSc:          Complications: No apparent anesthesia complications

## 2016-12-20 NOTE — Discharge Instructions (Signed)
° °  [facility]  Discharge Instructions  AV Fistula or Graft Surgery for Dialysis Access  Please refer to the following instructions for your post-procedure care. Your surgeon or physician assistant will discuss any changes with you.  Activity  You may drive the day following your surgery, if you are comfortable and no longer taking prescription pain medication. Resume full activity as the soreness in your incision resolves.  Bathing/Showering  You may shower after you go home. Keep your incision dry for 48 hours. Do not soak in a bathtub, hot tub, or swim until the incision heals completely. You may not shower if you have a hemodialysis catheter.  Incision Care  Clean your incision with mild soap and water after 48 hours. Pat the area dry with a clean towel. You do not need a bandage unless otherwise instructed. Do not apply any ointments or creams to your incision. You may have skin glue on your incision. Do not peel it off. It will come off on its own in about one week. Your arm may swell a bit after surgery. To reduce swelling use pillows to elevate your arm so it is above your heart. Your doctor will tell you if you need to lightly wrap your arm with an ACE bandage.  Diet  Resume your normal diet. There are not special food restrictions following this procedure. In order to heal from your surgery, it is CRITICAL to get adequate nutrition. Your body requires vitamins, minerals, and protein. Vegetables are the best source of vitamins and minerals. Vegetables also provide the perfect balance of protein. Processed food has little nutritional value, so try to avoid this.  Medications  Resume taking all of your medications. If your incision is causing pain, you may take over-the counter pain relievers such as acetaminophen (Tylenol). If you were prescribed a stronger pain medication, please be aware these medications can cause nausea and constipation. Prevent  nausea by taking the medication with a snack or meal. Avoid constipation by drinking plenty of fluids and eating foods with high amount of fiber, such as fruits, vegetables, and grains. Do not take Tylenol if you are taking prescription pain medications.  Follow up Your surgeon may want to see you in the office following your access surgery. If so, this will be arranged at the time of your surgery.  Please call us immediately for any of the following conditions:  Increased pain, redness, drainage (pus) from your incision site Fever of 101 degrees or higher Severe or worsening pain at your incision site Hand pain or numbness.  Reduce your risk of vascular disease:  Stop smoking. If you would like help, call [facility] at 1-800-QUIT-NOW (838-306-59711-(380)551-2469) or [place] at 380-465-1293417-884-2896  Manage your cholesterol Maintain a desired weight Control your diabetes Keep your blood pressure down  Dialysis  It will take several weeks to several months for your new dialysis access to be ready for use. Your surgeon will determine when it is OK to use it. Your nephrologist will continue to direct your dialysis. You can continue to use your Permcath until your new access is ready for use.   12/20/2016 [name] LemonsSylvester Dudley [id number] Sep 01, 1945  Surgeon(s): Chuck Hintickson, Christopher S, MD  Procedure(s): ARTERIOVENOUS (AV) FISTULA CREATION  x Do not stick fistula for 12 weeks    If you have any questions, please call the office at [phone].

## 2016-12-20 NOTE — Anesthesia Procedure Notes (Signed)
Procedure Name: LMA Insertion Date/Time: 12/20/2016 12:50 PM Performed by: Rosiland OzMeyers, Roanna Reaves, CRNA Pre-anesthesia Checklist: Patient identified, Emergency Drugs available, Suction available, Patient being monitored and Timeout performed Patient Re-evaluated:Patient Re-evaluated prior to induction Oxygen Delivery Method: Circle system utilized Preoxygenation: Pre-oxygenation with 100% oxygen Induction Type: IV induction LMA: LMA inserted LMA Size: 4.0 Number of attempts: 1 Placement Confirmation: positive ETCO2 and breath sounds checked- equal and bilateral Tube secured with: Tape Dental Injury: Teeth and Oropharynx as per pre-operative assessment

## 2016-12-20 NOTE — Progress Notes (Signed)
[facility] Rounding Note Subjective:   Had HD #3 11/21 Getting permanent access today ( Dr. Edilia Boickson) Transfused 1 unit last HD pre op Hb up to 8.7  11/22 0701 - 11/23 0700 In: 980 [P.O.:980] Out: 1225 [Urine:1225]  Filed Weights   12/18/16 1225 12/18/16 2055 12/20/16 1031  Weight: 70 kg (154 lb 5.2 oz) 70 kg (154 lb 5.2 oz) 69.9 kg (154 lb)   Physical Exam:  BP (!) 177/87 (BP Location: Right Arm)   Pulse 60   Temp 98.3 F (36.8 C) (Oral)   Resp 18   Ht 5\' 7"  (1.702 m)   Wt 69.9 kg (154 lb)   SpO2 97%   BMI 24.12 kg/m  NAD Weight 73.2->70 last HD TMT Regular. S1S2 No S3 S4  No murmur or rub Lungs clear Abdomen soft and not tender 1+ LE edema  TDC R IJ (11/18 IR)  Scheduled Meds: . [MAR Hold] amLODipine  10 mg Oral Daily  . [MAR Hold] calcitRIOL  1 mcg Oral Daily  . [MAR Hold] calcium carbonate  800 mg of elemental calcium Oral TID WC  . [MAR Hold] cholecalciferol  1,000 Units Oral Daily  . [MAR Hold] cloNIDine  0.2 mg Oral BID  . [MAR Hold] darbepoetin (ARANESP) injection - DIALYSIS  100 mcg Intravenous Q Mon-HD  . [MAR Hold] diazepam  1 mg Oral QHS  . [MAR Hold] heparin  5,000 Units Subcutaneous Q8H  . [MAR Hold] hydrALAZINE  10 mg Oral Q8H  . [MAR Hold] insulin aspart  0-5 Units Subcutaneous QHS  . [MAR Hold] insulin aspart  0-9 Units Subcutaneous TID WC  . [MAR Hold] terazosin  1 mg Oral QHS   Continuous Infusions: . sodium chloride 10 mL/hr at 12/20/16 1048  .  ceFAZolin (ANCEF) IV     PRN Meds:.[MAR Hold] acetaminophen, [MAR Hold] hydrALAZINE, [MAR Hold] hydrOXYzine, [MAR Hold] naphazoline-pheniramine, [MAR Hold] ondansetron (ZOFRAN) IV, [MAR Hold] polyethylene glycol, [MAR Hold] zolpidem  11/17 Renal US 8.5cm b/l with medical renal disease, no obstruction  CBC: Recent Labs    12/18/16 1004 12/20/16 0709  WBC 6.2 6.1  HGB 7.6* 8.7*  HCT 22.6* 26.4*  MCV 83.7 84.6  PLT 151 149*   Results for [name] Dudley Dudley, [name] Dudley (MRN [id number]) as of 12/16/2016 16:44  Ref.  Range 12/14/2016 12:32  PTH, Intact Latest Ref Range: 15 - 65 pg/mL 737 (H)   Recent Labs  Lab 12/14/16 0009  12/17/16 0543 12/18/16 1004 12/20/16 0709  NA  --    < > 133* 133* 132*  K  --    < > 4.2 3.5 4.3  CL  --    < > 106 101 99*  CO2  --    < > 17* 21* 23  GLUCOSE  --    < > 75 161* 90  BUN  --    < > 76* 47* 44*  CREATININE  --    < > 10.14* 7.40* 6.52*  CALCIUM  --    < > 7.1* 7.9* 8.7*  PHOS 9.3*  --   --  4.1 4.1   < > = values in this interval not displayed.   Recent Labs  Lab 12/17/16 0543 12/18/16 1004 12/20/16 0709  WBC 6.2 6.2 6.1  HGB 7.5* 7.6* 8.7*  HCT 22.3* 22.6* 26.4*  MCV 81.1 83.7 84.6  PLT 155 151 149*     Background: 52M with new [facility] severe anemia. HD initiated.   Assessment/Plan  1. New ESRD.  s/p R IJ TDC 11/18 [facility], HD #1 11/18 1. AVF/AVG today 2. HD #4 after 3. [facility], will be unable to find out additional info until Monday (unfortunately) 4. Establishing EDW - I think will be around 70 kg 2. Severe anemia 1. 4 units transfused this admit (last was 11/21) 2. TSAT 54% and Ferritin 309; B12 replete; likely all related to CKD/ESRD 3. [facility]anesp 100 QSunday [facility] 3. Secondary HPT w/Hypocalcemia and hyperphosphatemia 1. PTH 767 - Calcitriol 1mcg daily for now (consolidate to HD days once clear where unit will be and what days); 2. Tums 3 with meals 4. HTN 5. DM2  Camille Balynthia Lynleigh Kovack, MD [facility] [phone] Pager 12/20/2016, 12:08 PM

## 2016-12-20 NOTE — Interval H&P Note (Signed)
History and Physical Interval Note:  12/20/2016 12:12 PM  [name] LemonsSylvester Pendell  has presented today for surgery, with the diagnosis of end stage renal disease  The various methods of treatment have been discussed with the patient and family. After consideration of risks, benefits and other options for treatment, the patient has consented to  Procedure(s): ARTERIOVENOUS (AV) FISTULA CREATION (Left) as a surgical intervention .  The patient's history has been reviewed, patient examined, no change in status, stable for surgery.  I have reviewed the patient's chart and labs.  Questions were answered to the patient's satisfaction.     Waverly Ferrarihristopher Dickson

## 2016-12-21 ENCOUNTER — Encounter (HOSPITAL_COMMUNITY): Payer: Self-pay | Admitting: Vascular Surgery

## 2016-12-21 LAB — GLUCOSE, CAPILLARY: Glucose-Capillary: 72 mg/dL (ref 65–99)

## 2016-12-21 NOTE — Progress Notes (Signed)
   VASCULAR SURGERY ASSESSMENT & PLAN:   1 Day Post-Op s/p: Left radiocephalic AV fistula.  The fistula has an excellent thrill.  Okay for discharge from our standpoint.  I have arranged follow-up in 6 weeks to check on the maturation of his fistula.  SUBJECTIVE:   No complaints.  PHYSICAL EXAM:   Vitals:   12/20/16 1938 12/20/16 1940 12/20/16 2100 12/21/16 0502  BP: (!) 146/87 (!) 176/84 (!) 171/88 (!) 176/82  Pulse: 67 66 68 64  Resp: 17 12 16 18   Temp:  98.2 F (36.8 C) 98.4 F (36.9 C) 98.8 F (37.1 C)  TempSrc:  Oral Oral Oral  SpO2:  96% 100% 97%  Weight:  149 lb 7.6 oz (67.8 kg) 150 lb 12.7 oz (68.4 kg)   Height:       Excellent thrill in left radiocephalic AV fistula. Left hand is warm and well perfused.  LABS:   Lab Results  Component Value Date   WBC 6.1 12/20/2016   HGB 8.7 (L) 12/20/2016   HCT 26.4 (L) 12/20/2016   MCV 84.6 12/20/2016   PLT 149 (L) 12/20/2016   Lab Results  Component Value Date   CREATININE 6.52 (H) 12/20/2016   Lab Results  Component Value Date   INR 1.04 12/20/2016   CBG (last 3)  Recent Labs    12/20/16 0812 12/20/16 1440 12/20/16 2048  GLUCAP 83 86 204*    PROBLEM LIST:    Principal Problem:   Anemia due to end stage renal disease (HCC) Active Problems:   Hypertension   Hypocalcemia   ESRD (end stage renal disease) (HCC)   BPH (benign prostatic hyperplasia)   Type II diabetes mellitus with renal manifestations (HCC)   CURRENT MEDS:   . amLODipine  10 mg Oral Daily  . calcitRIOL  1 mcg Oral Daily  . calcium carbonate  800 mg of elemental calcium Oral TID WC  . cloNIDine  0.2 mg Oral BID  . [START ON 12/23/2016] darbepoetin (ARANESP) injection - DIALYSIS  100 mcg Intravenous Q Mon-HD  . diazepam  1 mg Oral QHS  . heparin  5,000 Units Subcutaneous Q8H  . hydrALAZINE  10 mg Oral Q8H  . insulin aspart  0-5 Units Subcutaneous QHS  . insulin aspart  0-9 Units Subcutaneous TID WC  . terazosin  1 mg Oral QHS     Waverly FerrariChristopher Ugo Thoma Beeper: [phone] Office: [phone] 12/21/2016

## 2016-12-21 NOTE — Progress Notes (Signed)
Patient's eye medicine given to him.

## 2016-12-21 NOTE — Progress Notes (Signed)
MD, Dr. Selena BattenKim to bedside to explain the risks of leaving AMA. Patient agreed to stay overnight, but reports he is leaving in the morning. Will continue to monitor.  Dondra SpryMoore, Kaiyah Eber Islee, RN

## 2016-12-21 NOTE — Progress Notes (Signed)
Patient threw the trash can inside the bathroom when he knew that he is knows that he needs to go for his hemodialysis treatment and aside for not having a major meal today,he just came from fistula placement and will go to hemodialysis afterward.Patient also pulled out his iv access,Spoke to patient and he was able to calm down and agreed to go to his HD treatment.

## 2016-12-21 NOTE — Discharge Summary (Signed)
AMA  Patient expresses desire to leave the Hospital immidiately, patient has been warned by nursing staff that this is not Medically advisable at this time, and can result in Medical complications like Death and Disability. By the time that I arrived to the unit patient already left. Per nursing staff patient understood and accepted the risks involved of not having a dialysis unit assigned and not able to get dialyze. Patient assumes full responsibilty of this decision.  I was unable to speak with the patient as he was in a rush to leave the hospital, per nursing staff he removed his IV line and was not willing to wait to see me. Patient signed AMA form and left. Patient with capability of making his own medical decisions.   [name] Dudley M.D on 12/21/2016 at 11:37 AM  [facility]  Time < 30 minutes  Last Note Below from 12/20/16   PROGRESS NOTE [facility]   Brief Narrative:  [name] Dudley  is a 71 year old male with CKD stage IV, hypertension, diabetes, tobacco abuse, BPH presented with shortness of breath, peripheral edema.  Patient follows nephrology was recommended dialysis but he had opted not to. On admission patient presented with shortness of breath, progressively getting worse, worsening bilateral leg edema, found to have hemoglobin of 4.1. Patient was admitted for further workup. Patient was transfused 4 units of PRBC's. Nephrology was consulted and was started on HD.   Subjective: Patient upset, wants to go home, for permanent access today. No acute events overnight   Assessment & Plan: Anemia due to end stage renal disease (HCC) Hemoglobin 4.1 at the time of admission, FOBT negative, likely due to anemia of renal disease Hgb stable, getting 1 unit PRBC's for a total of 5 units during admission  Anemia profile consistent  with anemia of chronic disease Hgb up to 8.7 Continue ESA with hemodialysis  New ESRD  Nephrology and vascular surgery following  Renal ultrasound showed no evidence of hydronephrosis, increased renal parenchymal echogenicity TDC placed 11/18, started hemodialysis 11/18 (#1), receiving hemodialysis #2 11/20, hemodialysis #3 today.  Permanent assess today  Awaiting for [facility] OP dialysis center, unable to find one yet.  Will get HD after surgery today   Hypertension -  BP still in the high side, was started in hydralazine last night, he is also on clonidine and Norvasc. He will get HD today will monitor BP and re-check after HD, hopefully this help stabilizing BP, if no improvement will increase clonidine.   Secondary PTH w/ Hypocalcemia Continue management per nephro   BPH Continue terazosin  Type II diabetes mellitus with renal manifestations (HCC) Hemoglobin A1c 5.2 CBGs controlled, continue siding scale insulin  DVT prophylaxis: Hep sq Code Status: Full  Family Communication: None at bedside  Disposition Plan: Awaiting OP HD center    Consultants:   Nephrology   Vascular surgery   Procedures:   None   Antimicrobials:  None     Objective: Vitals:   12/20/16 1938 12/20/16 1940 12/20/16 2100 12/21/16 0502  BP: (!) 146/87 (!) 176/84 (!) 171/88 (!) 176/82  Pulse: 67 66 68 64  Resp: 17 12 16 18   Temp:  98.2 F (  36.8 C) 98.4 F (36.9 C) 98.8 F (37.1 C)  TempSrc:  Oral Oral Oral  SpO2:  96% 100% 97%  Weight:  67.8 kg (149 lb 7.6 oz) 68.4 kg (150 lb 12.7 oz)   Height:        Intake/Output Summary (Last 24 hours) at 12/21/2016 1141 Last data filed at 12/21/2016 0600 Gross per 24 hour  Intake 410 ml  Output 3020 ml  Net -2610 ml   Filed Weights   12/20/16 1600 12/20/16 1940 12/20/16 2100  Weight: 70.9 kg (156 lb 4.9 oz) 67.8 kg (149 lb 7.6 oz) 68.4 kg (150 lb 12.7 oz)    Examination:   General: NAD, upset  Cardiovascular: RRR, S1/S2 +, no rubs,  no gallops Respiratory: CTA bilaterally, no wheezing, no rhonchi Abdominal: Soft, NT, ND, bowel sounds + Extremities: no edema  Data Reviewed: I have personally reviewed following labs and imaging studies  CBC: Recent Labs  Lab 12/15/16 0344 12/16/16 0457 12/17/16 0543 12/18/16 1004 12/20/16 0709  WBC 5.9 7.0 6.2 6.2 6.1  HGB 7.7* 7.8* 7.5* 7.6* 8.7*  HCT 22.5* 23.3* 22.3* 22.6* 26.4*  MCV 80.9 80.9 81.1 83.7 84.6  PLT 187 175 155 151 149*   Basic Metabolic Panel: Recent Labs  Lab 12/15/16 0344 12/16/16 0457 12/17/16 0543 12/18/16 1004 12/20/16 0709  NA 136 136 133* 133* 132*  K 4.7 4.0 4.2 3.5 4.3  CL 112* 109 106 101 99*  CO2 12* 16* 17* 21* 23  GLUCOSE 80 87 75 161* 90  BUN 101* 73* 76* 47* 44*  CREATININE 13.10* 9.68* 10.14* 7.40* 6.52*  CALCIUM 6.6* 6.8* 7.1* 7.9* 8.7*  PHOS  --   --   --  4.1 4.1   GFR: Estimated Creatinine Clearance: 9.7 mL/min (A) (by C-G formula based on SCr of 6.52 mg/dL (H)). Liver Function Tests: Recent Labs  Lab 12/15/16 0344 12/18/16 1004 12/20/16 0709  ALBUMIN 2.5* 2.4* 2.6*   No results for input(s): LIPASE, AMYLASE in the last 168 hours. No results for input(s): AMMONIA in the last 168 hours. Coagulation Profile: Recent Labs  Lab 12/20/16 0709  INR 1.04   Cardiac Enzymes: No results for input(s): CKTOTAL, CKMB, CKMBINDEX, TROPONINI in the last 168 hours. BNP (last 3 results) No results for input(s): PROBNP in the last 8760 hours. HbA1C: No results for input(s): HGBA1C in the last 72 hours. CBG: Recent Labs  Lab 12/19/16 2116 12/20/16 0812 12/20/16 1440 12/20/16 2048 12/21/16 0730  GLUCAP 125* 83 86 204* 72   Lipid Profile: No results for input(s): CHOL, HDL, LDLCALC, TRIG, CHOLHDL, LDLDIRECT in the last 72 hours. Thyroid Function Tests: No results for input(s): TSH, T4TOTAL, FREET4, T3FREE, THYROIDAB in the last 72 hours. Anemia Panel: No results for input(s): VITAMINB12, FOLATE, FERRITIN, TIBC, IRON,  RETICCTPCT in the last 72 hours. Sepsis Labs: No results for input(s): PROCALCITON, LATICACIDVEN in the last 168 hours.  Recent Results (from the past 240 hour(s))  MRSA PCR Screening     Status: None   Collection Time: 12/14/16  3:22 AM  Result Value Ref Range Status   MRSA by PCR NEGATIVE NEGATIVE Final    Comment:        The GeneXpert MRSA Assay (FDA approved for NASAL specimens only), is one component of a comprehensive MRSA colonization surveillance program. It is not intended to diagnose MRSA infection nor to guide or monitor treatment for MRSA infections.   Surgical pcr screen     Status: None   Collection  Time: 12/15/16  6:02 AM  Result Value Ref Range Status   MRSA, PCR NEGATIVE NEGATIVE Final   Staphylococcus aureus NEGATIVE NEGATIVE Final    Comment: (NOTE) The Xpert SA Assay (FDA approved for NASAL specimens in patients 122 years of age and older), is one component of a comprehensive surveillance program. It is not intended to diagnose infection nor to guide or monitor treatment.   Surgical pcr screen     Status: None   Collection Time: 12/19/16 11:17 PM  Result Value Ref Range Status   MRSA, PCR NEGATIVE NEGATIVE Final   Staphylococcus aureus NEGATIVE NEGATIVE Final    Comment: (NOTE) The Xpert SA Assay (FDA approved for NASAL specimens in patients 71 years of age and older), is one component of a comprehensive surveillance program. It is not intended to diagnose infection nor to guide or monitor treatment.       Radiology Studies: No results found.    Scheduled Meds: . amLODipine  10 mg Oral Daily  . calcitRIOL  1 mcg Oral Daily  . calcium carbonate  800 mg of elemental calcium Oral TID WC  . cloNIDine  0.2 mg Oral BID  . [START ON 12/23/2016] darbepoetin (ARANESP) injection - DIALYSIS  100 mcg Intravenous Q Mon-HD  . diazepam  1 mg Oral QHS  . heparin  5,000 Units Subcutaneous Q8H  . hydrALAZINE  10 mg Oral Q8H  . insulin aspart  0-5 Units  Subcutaneous QHS  . insulin aspart  0-9 Units Subcutaneous TID WC  . terazosin  1 mg Oral QHS   Continuous Infusions: . sodium chloride Stopped (12/20/16 1420)     LOS: 8 days

## 2016-12-21 NOTE — Progress Notes (Signed)
Explained to the patient of risk on going out on AMA,especially without outpatient HD assign on him.Patient still wants to go home and signed AMA form.M.D aware.

## 2016-12-21 NOTE — Progress Notes (Signed)
Patient refused to place telemetry back on him,saying "im going home".M.D. Made aware.

## 2016-12-23 ENCOUNTER — Telehealth: Payer: Self-pay | Admitting: Vascular Surgery

## 2016-12-23 NOTE — Telephone Encounter (Signed)
-----   Message from Sharee PimpleMarilyn K McChesney, RN sent at 12/20/2016  6:09 PM EST ----- Regarding: 6 weeks w/ duplex   ----- Message ----- From: Chuck Hintickson, Christopher S, MD Sent: 12/20/2016   2:27 PM To: [facility] Charge Pool Subject: charge                                          PROCEDURE:   Left radiocephalic AV fistula  SURGEON: Di Kindlehristopher S. Edilia Boickson, MD, FACS  ASSIST: Doreatha MassedSamantha Rhyne, PA  He will need a follow-up visit in 6 weeks with a duplex at that time to check on the maturation of his fistula.  Thank you.CD

## 2016-12-23 NOTE — Telephone Encounter (Signed)
Sched appt 02/06/16; lab at 1:00 and MD at 2:15. Lm on cell#.

## 2016-12-24 ENCOUNTER — Other Ambulatory Visit: Payer: Self-pay

## 2016-12-24 DIAGNOSIS — Z992 Dependence on renal dialysis: Secondary | ICD-10-CM

## 2016-12-24 DIAGNOSIS — N186 End stage renal disease: Secondary | ICD-10-CM

## 2016-12-24 DIAGNOSIS — Z48812 Encounter for surgical aftercare following surgery on the circulatory system: Secondary | ICD-10-CM

## 2016-12-31 ENCOUNTER — Encounter: Payer: Self-pay | Admitting: Nephrology

## 2017-02-05 ENCOUNTER — Ambulatory Visit (HOSPITAL_COMMUNITY)
Admission: RE | Admit: 2017-02-05 | Discharge: 2017-02-05 | Disposition: A | Discharge status: Home / Self Care | Payer: Non-veteran care | Source: Ambulatory Visit | Attending: Vascular Surgery | Admitting: Vascular Surgery

## 2017-02-05 ENCOUNTER — Ambulatory Visit (INDEPENDENT_AMBULATORY_CARE_PROVIDER_SITE_OTHER): Payer: Medicare Other | Admitting: Vascular Surgery

## 2017-02-05 ENCOUNTER — Encounter: Payer: Self-pay | Admitting: Vascular Surgery

## 2017-02-05 VITALS — BP 163/83 | HR 77 | Temp 97.7°F | Resp 16 | Ht 67.0 in | Wt 155.7 lb

## 2017-02-05 DIAGNOSIS — Z48812 Encounter for surgical aftercare following surgery on the circulatory system: Secondary | ICD-10-CM | POA: Diagnosis not present

## 2017-02-05 DIAGNOSIS — Z992 Dependence on renal dialysis: Secondary | ICD-10-CM | POA: Diagnosis not present

## 2017-02-05 DIAGNOSIS — N186 End stage renal disease: Secondary | ICD-10-CM

## 2017-02-05 NOTE — Progress Notes (Signed)
Patient name: [name] Dudley MRN: [id number] DOB: November 05, 1945 Sex: male  REASON FOR VISIT:   Follow-up after placement of left radiocephalic AV fistula  HPI:   [name] LemonsSylvester Pitstick is a pleasant 72 y.o. male who presented for new access.  He dialyzes via a tunneled dialysis catheter.  He had a left radiocephalic fistula placed on 12/20/2016.  The cephalic vein was noted to be 3 mm in size at the time of surgery.   He dialyzes on Tuesdays Thursdays and Saturdays.  His catheter is working well.  He denies pain or paresthesias in his left arm.  Current Outpatient Medications  Medication Sig Dispense Refill  . amLODipine (NORVASC) 10 MG tablet Take 10 mg daily by mouth.    Marland Kitchen. buPROPion (WELLBUTRIN SR) 100 MG 12 hr tablet Take 100 mg every morning by mouth.    . Cholecalciferol (VITAMIN D-3) 1000 units CAPS Take 1,000 Units daily by mouth.    . cloNIDine (CATAPRES) 0.1 MG tablet Take 0.1 mg 2 (two) times daily by mouth.    Marland Kitchen. epoetin alfa (EPOGEN,PROCRIT) [id number] UNIT/ML injection 10,000 Units once a week.    . ferrous sulfate 325 (65 FE) MG tablet Take 325 mg 3 (three) times daily with meals by mouth.    . folic acid (FOLVITE) 1 MG tablet Take 1 mg daily by mouth.    . gabapentin (NEURONTIN) 400 MG capsule Take 400 mg at bedtime by mouth.    . insulin glargine (LANTUS) 100 UNIT/ML injection Inject 4 Units at bedtime into the skin.     . mirtazapine (REMERON) 15 MG tablet Take 15 mg at bedtime by mouth.    . sildenafil (VIAGRA) 100 MG tablet Take 100 mg daily as needed by mouth for erectile dysfunction.    . sodium bicarbonate 650 MG tablet Take 650 mg every 8 (eight) hours by mouth.    . terazosin (HYTRIN) 5 MG capsule Take 5 mg at bedtime by mouth.     No current facility-administered medications for this visit.     REVIEW OF SYSTEMS:  [X]  denotes positive finding, [ ]  denotes negative finding Cardiac  Comments:  Chest pain or chest pressure:    Shortness of breath upon exertion:    Short of  breath when lying flat:    Irregular heart rhythm:    Constitutional    Fever or chills:     PHYSICAL EXAM:   Vitals:   02/05/17 1422  BP: (!) 163/83  Pulse: 77  Resp: 16  Temp: 97.7 F (36.5 C)  TempSrc: Oral  SpO2: 96%  Weight: 155 lb 11.2 oz (70.6 kg)  Height: 5\' 7"  (1.702 m)    GENERAL: The patient is a well-nourished male, in no acute distress. The vital signs are documented above. CARDIOVASCULAR: There is a regular rate and rhythm. PULMONARY: There is good air exchange bilaterally without wheezing or rales. He has a palpable thrill in his left radiocephalic fistula.  The vein however is small.  DATA:   DUPLEX OF LEFT AV FISTULA: I have independently interpreted the duplex of his left radiocephalic fistula.  The diameters of the fistula ranged from 0.23-0.34 cm.  MEDICAL ISSUES:   STATUS POST LEFT RADIOCEPHALIC AV FISTULA: His fistula is patent but the vein is still quite small.  There is not one focal area that appears to be problematic.  I have recommended that we give this some more time to see if the vein improves.  If there are still problems in 6 weeks  and I think we should consider a fistulogram.  Alternatively we could consider placing an AV graft but the patient and I both agree that we should give this fistula the benefit of the doubt.  I will see him back in 6 weeks.  He knows to call sooner if he has problems.  Waverly Ferrari [facility] [phone]

## 2017-02-07 NOTE — Addendum Note (Signed)
Addended by: Burton ApleyPETTY, Shenise Wolgamott A on: 02/07/2017 12:38 PM   Modules accepted: Orders

## 2017-03-19 ENCOUNTER — Encounter: Payer: Self-pay | Admitting: *Deleted

## 2017-03-19 ENCOUNTER — Ambulatory Visit (INDEPENDENT_AMBULATORY_CARE_PROVIDER_SITE_OTHER): Payer: Medicare Other | Admitting: Vascular Surgery

## 2017-03-19 ENCOUNTER — Encounter: Payer: Self-pay | Admitting: Vascular Surgery

## 2017-03-19 ENCOUNTER — Other Ambulatory Visit: Payer: Self-pay | Admitting: *Deleted

## 2017-03-19 ENCOUNTER — Ambulatory Visit (HOSPITAL_COMMUNITY)
Admission: RE | Admit: 2017-03-19 | Discharge: 2017-03-19 | Disposition: A | Discharge status: Home / Self Care | Payer: Non-veteran care | Source: Ambulatory Visit | Attending: Vascular Surgery | Admitting: Vascular Surgery

## 2017-03-19 VITALS — BP 159/91 | HR 77 | Resp 20 | Ht 67.0 in | Wt 164.0 lb

## 2017-03-19 DIAGNOSIS — N186 End stage renal disease: Secondary | ICD-10-CM

## 2017-03-19 DIAGNOSIS — Z992 Dependence on renal dialysis: Secondary | ICD-10-CM | POA: Diagnosis not present

## 2017-03-19 NOTE — Progress Notes (Signed)
Patient name: [name] Dudley MRN: [id number] DOB: [birth year] Sex: male  REASON FOR VISIT:   Follow-up of left radiocephalic AV fistula  HPI:   [name] Dudley is a pleasant 72 y.o. male who is on dialysis.  He has a functioning dialysis catheter.  He had a left radiocephalic fistula placed on 12/20/2016.  This has not matured adequately and he comes in for a follow-up visit.  I reviewed his op note from 12/20/2016.  The forearm cephalic vein was 3 mm.  He was noted to have a reasonable upper arm cephalic vein.  He has a functioning right IJ tunneled dialysis catheter.  He dialyzes on Tuesdays Thursdays and Saturdays.  He denies any recent uremic symptoms.  Specifically, he denies nausea, vomiting, fatigue, anorexia, or palpitations.  Current Outpatient Medications  Medication Sig Dispense Refill  . amLODipine (NORVASC) 10 MG tablet Take 10 mg daily by mouth.    Marland Kitchen buPROPion (WELLBUTRIN SR) 100 MG 12 hr tablet Take 100 mg every morning by mouth.    . Cholecalciferol (VITAMIN D-3) 1000 units CAPS Take 1,000 Units daily by mouth.    . cloNIDine (CATAPRES) 0.1 MG tablet Take 0.1 mg 2 (two) times daily by mouth.    Marland Kitchen epoetin alfa (EPOGEN,PROCRIT) 65784 UNIT/ML injection 10,000 Units once a week.    . ferrous sulfate 325 (65 FE) MG tablet Take 325 mg 3 (three) times daily with meals by mouth.    . folic acid (FOLVITE) 1 MG tablet Take 1 mg daily by mouth.    . gabapentin (NEURONTIN) 400 MG capsule Take 400 mg at bedtime by mouth.    . insulin glargine (LANTUS) 100 UNIT/ML injection Inject 4 Units at bedtime into the skin.     . mirtazapine (REMERON) 15 MG tablet Take 15 mg at bedtime by mouth.    . sildenafil (VIAGRA) 100 MG tablet Take 100 mg daily as needed by mouth for erectile dysfunction.    . sodium bicarbonate 650 MG tablet Take 650 mg every 8 (eight) hours by mouth.    . terazosin (HYTRIN) 5 MG capsule Take 5 mg at bedtime by mouth.     No current facility-administered medications for  this visit.     REVIEW OF SYSTEMS:  [X]  denotes positive finding, [ ]  denotes negative finding Cardiac  Comments:  Chest pain or chest pressure:    Shortness of breath upon exertion:    Short of breath when lying flat:    Irregular heart rhythm:    Constitutional    Fever or chills:     PHYSICAL EXAM:   Vitals:   03/19/17 1525 03/19/17 1527  BP: (!) 172/95 (!) 159/91  Pulse: 77   Resp: 20   SpO2: 98%   Weight: 164 lb (74.4 kg)   Height: 5\' 7"  (1.702 m)     GENERAL: The patient is a well-nourished male, in no acute distress. The vital signs are documented above. CARDIOVASCULAR: There is a regular rate and rhythm. PULMONARY: There is good air exchange bilaterally without wheezing or rales. He has a thrill in his left radiocephalic fistula which is fairly weak.  The vein has not matured adequately.  He has a palpable left radial pulse. The upper arm cephalic vein looks reasonable.  DATA:   DUPLEX LEFT RADIOCEPHALIC AV FISTULA: The diameters of his fistula range from 0.2-0.4 cm.  There are significantly elevated velocities in the distal forearm.  MEDICAL ISSUES:   POORLY MATURING LEFT RADIOCEPHALIC AV FISTULA: His left radiocephalic  fistula is not maturing adequately.  He has significant scar tissue in the proximal fistula.  I have explained that the options are to attempt venoplasty of the radiocephalic fistula versus placing a new left brachiocephalic fistula.  Based on his vein map previously I think the upper arm fistula will more likely be successful.  He would like to proceed directly to placement of a brachiocephalic fistula which I think is not unreasonable.  He dialyzes on Tuesdays Thursdays and Saturdays and we have arranged this for Monday, 04/07/2017.  We have discussed the procedure and potential complications and he is agreeable to proceed.  Waverly Ferrarihristopher Lamika Connolly [facility] [phone]

## 2017-03-19 NOTE — H&P (View-Only) (Signed)
 Patient name: [name] Dudley MRN: [id number] DOB: 01/12/1946 Sex: male  REASON FOR VISIT:   Follow-up of left radiocephalic AV fistula  HPI:   [name] Dudley is a pleasant 72 y.o. male who is on dialysis.  He has a functioning dialysis catheter.  He had a left radiocephalic fistula placed on 12/20/2016.  This has not matured adequately and he comes in for a follow-up visit.  I reviewed his op note from 12/20/2016.  The forearm cephalic vein was 3 mm.  He was noted to have a reasonable upper arm cephalic vein.  He has a functioning right IJ tunneled dialysis catheter.  He dialyzes on Tuesdays Thursdays and Saturdays.  He denies any recent uremic symptoms.  Specifically, he denies nausea, vomiting, fatigue, anorexia, or palpitations.  Current Outpatient Medications  Medication Sig Dispense Refill  . amLODipine (NORVASC) 10 MG tablet Take 10 mg daily by mouth.    . buPROPion (WELLBUTRIN SR) 100 MG 12 hr tablet Take 100 mg every morning by mouth.    . Cholecalciferol (VITAMIN D-3) 1000 units CAPS Take 1,000 Units daily by mouth.    . cloNIDine (CATAPRES) 0.1 MG tablet Take 0.1 mg 2 (two) times daily by mouth.    . epoetin alfa (EPOGEN,PROCRIT) 10000 UNIT/ML injection 10,000 Units once a week.    . ferrous sulfate 325 (65 FE) MG tablet Take 325 mg 3 (three) times daily with meals by mouth.    . folic acid (FOLVITE) 1 MG tablet Take 1 mg daily by mouth.    . gabapentin (NEURONTIN) 400 MG capsule Take 400 mg at bedtime by mouth.    . insulin glargine (LANTUS) 100 UNIT/ML injection Inject 4 Units at bedtime into the skin.     . mirtazapine (REMERON) 15 MG tablet Take 15 mg at bedtime by mouth.    . sildenafil (VIAGRA) 100 MG tablet Take 100 mg daily as needed by mouth for erectile dysfunction.    . sodium bicarbonate 650 MG tablet Take 650 mg every 8 (eight) hours by mouth.    . terazosin (HYTRIN) 5 MG capsule Take 5 mg at bedtime by mouth.     No current facility-administered medications for  this visit.     REVIEW OF SYSTEMS:  [X] denotes positive finding, [ ] denotes negative finding Cardiac  Comments:  Chest pain or chest pressure:    Shortness of breath upon exertion:    Short of breath when lying flat:    Irregular heart rhythm:    Constitutional    Fever or chills:     PHYSICAL EXAM:   Vitals:   03/19/17 1525 03/19/17 1527  BP: (!) 172/95 (!) 159/91  Pulse: 77   Resp: 20   SpO2: 98%   Weight: 164 lb (74.4 kg)   Height: 5' 7" (1.702 m)     GENERAL: The patient is a well-nourished male, in no acute distress. The vital signs are documented above. CARDIOVASCULAR: There is a regular rate and rhythm. PULMONARY: There is good air exchange bilaterally without wheezing or rales. He has a thrill in his left radiocephalic fistula which is fairly weak.  The vein has not matured adequately.  He has a palpable left radial pulse. The upper arm cephalic vein looks reasonable.  DATA:   DUPLEX LEFT RADIOCEPHALIC AV FISTULA: The diameters of his fistula range from 0.2-0.4 cm.  There are significantly elevated velocities in the distal forearm.  MEDICAL ISSUES:   POORLY MATURING LEFT RADIOCEPHALIC AV FISTULA: His left radiocephalic   fistula is not maturing adequately.  He has significant scar tissue in the proximal fistula.  I have explained that the options are to attempt venoplasty of the radiocephalic fistula versus placing a new left brachiocephalic fistula.  Based on his vein map previously I think the upper arm fistula will more likely be successful.  He would like to proceed directly to placement of a brachiocephalic fistula which I think is not unreasonable.  He dialyzes on Tuesdays Thursdays and Saturdays and we have arranged this for Monday, 04/07/2017.  We have discussed the procedure and potential complications and he is agreeable to proceed.  [name] Dudley [facility] [place] [facility] [phone] 

## 2017-04-03 ENCOUNTER — Other Ambulatory Visit: Payer: Self-pay

## 2017-04-03 ENCOUNTER — Encounter (HOSPITAL_COMMUNITY): Payer: Self-pay | Admitting: *Deleted

## 2017-04-03 NOTE — Progress Notes (Signed)
Patient verbalized understanding of pre-op instructions  Does not  check his sugar that often, Does not know his fasting sugar Gave instructions about checking his sugar

## 2017-04-07 ENCOUNTER — Encounter (HOSPITAL_COMMUNITY)
Admission: RE | Disposition: A | Discharge status: Home / Self Care | Payer: Self-pay | Source: Ambulatory Visit | Attending: Vascular Surgery

## 2017-04-07 ENCOUNTER — Ambulatory Visit (HOSPITAL_COMMUNITY)
Admission: RE | Admit: 2017-04-07 | Discharge: 2017-04-07 | Disposition: A | Discharge status: Home / Self Care | Payer: Non-veteran care | Source: Ambulatory Visit | Attending: Vascular Surgery | Admitting: Vascular Surgery

## 2017-04-07 ENCOUNTER — Ambulatory Visit (HOSPITAL_COMMUNITY): Payer: Non-veteran care | Admitting: Anesthesiology

## 2017-04-07 ENCOUNTER — Other Ambulatory Visit: Payer: Self-pay

## 2017-04-07 ENCOUNTER — Encounter (HOSPITAL_COMMUNITY): Payer: Self-pay | Admitting: *Deleted

## 2017-04-07 DIAGNOSIS — F17200 Nicotine dependence, unspecified, uncomplicated: Secondary | ICD-10-CM | POA: Insufficient documentation

## 2017-04-07 DIAGNOSIS — D631 Anemia in chronic kidney disease: Secondary | ICD-10-CM | POA: Insufficient documentation

## 2017-04-07 DIAGNOSIS — T82898A Other specified complication of vascular prosthetic devices, implants and grafts, initial encounter: Secondary | ICD-10-CM | POA: Diagnosis not present

## 2017-04-07 DIAGNOSIS — N185 Chronic kidney disease, stage 5: Secondary | ICD-10-CM | POA: Diagnosis not present

## 2017-04-07 DIAGNOSIS — Z79899 Other long term (current) drug therapy: Secondary | ICD-10-CM | POA: Diagnosis not present

## 2017-04-07 DIAGNOSIS — Z794 Long term (current) use of insulin: Secondary | ICD-10-CM | POA: Diagnosis not present

## 2017-04-07 DIAGNOSIS — E1122 Type 2 diabetes mellitus with diabetic chronic kidney disease: Secondary | ICD-10-CM | POA: Diagnosis not present

## 2017-04-07 DIAGNOSIS — N186 End stage renal disease: Secondary | ICD-10-CM | POA: Diagnosis not present

## 2017-04-07 DIAGNOSIS — I120 Hypertensive chronic kidney disease with stage 5 chronic kidney disease or end stage renal disease: Secondary | ICD-10-CM | POA: Diagnosis present

## 2017-04-07 DIAGNOSIS — Z992 Dependence on renal dialysis: Secondary | ICD-10-CM | POA: Diagnosis not present

## 2017-04-07 HISTORY — PX: AV FISTULA PLACEMENT: SHX1204

## 2017-04-07 HISTORY — PX: LIGATION OF ARTERIOVENOUS  FISTULA: SHX5948

## 2017-04-07 LAB — POCT I-STAT 4, (NA,K, GLUC, HGB,HCT)
Glucose, Bld: 89 mg/dL (ref 65–99)
HEMATOCRIT: 36 % — AB (ref 39.0–52.0)
HEMOGLOBIN: 12.2 g/dL — AB (ref 13.0–17.0)
Potassium: 3.4 mmol/L — ABNORMAL LOW (ref 3.5–5.1)
SODIUM: 139 mmol/L (ref 135–145)

## 2017-04-07 LAB — GLUCOSE, CAPILLARY
Glucose-Capillary: 102 mg/dL — ABNORMAL HIGH (ref 65–99)
Glucose-Capillary: 77 mg/dL (ref 65–99)

## 2017-04-07 SURGERY — ARTERIOVENOUS (AV) FISTULA CREATION
Anesthesia: General | Site: Arm Upper | Laterality: Left

## 2017-04-07 MED ORDER — DEXAMETHASONE SODIUM PHOSPHATE 10 MG/ML IJ SOLN
INTRAMUSCULAR | Status: DC | PRN
  Administered 2017-04-07: 4 mg via INTRAVENOUS

## 2017-04-07 MED ORDER — PROTAMINE SULFATE 10 MG/ML IV SOLN
INTRAVENOUS | Status: AC
  Filled 2017-04-07: qty 5

## 2017-04-07 MED ORDER — HEPARIN SODIUM (PORCINE) 1000 UNIT/ML IJ SOLN
INTRAMUSCULAR | Status: DC | PRN
  Administered 2017-04-07: 7000 [IU] via INTRAVENOUS

## 2017-04-07 MED ORDER — LIDOCAINE HCL (CARDIAC) 20 MG/ML IV SOLN
INTRAVENOUS | Status: AC
  Filled 2017-04-07: qty 5

## 2017-04-07 MED ORDER — FENTANYL CITRATE (PF) 100 MCG/2ML IJ SOLN
INTRAMUSCULAR | Status: AC
  Administered 2017-04-07: 50 ug via INTRAVENOUS
  Filled 2017-04-07: qty 2

## 2017-04-07 MED ORDER — LIDOCAINE HCL (PF) 1 % IJ SOLN
INTRAMUSCULAR | Status: AC
  Filled 2017-04-07: qty 30

## 2017-04-07 MED ORDER — PROTAMINE SULFATE 10 MG/ML IV SOLN
INTRAVENOUS | Status: DC | PRN
  Administered 2017-04-07: 30 mg via INTRAVENOUS

## 2017-04-07 MED ORDER — PHENYLEPHRINE HCL 10 MG/ML IJ SOLN
INTRAMUSCULAR | Status: DC | PRN
  Administered 2017-04-07: 80 ug via INTRAVENOUS

## 2017-04-07 MED ORDER — PROPOFOL 10 MG/ML IV BOLUS
INTRAVENOUS | Status: DC | PRN
  Administered 2017-04-07: 150 mg via INTRAVENOUS

## 2017-04-07 MED ORDER — 0.9 % SODIUM CHLORIDE (POUR BTL) OPTIME
TOPICAL | Status: DC | PRN
  Administered 2017-04-07: 1000 mL

## 2017-04-07 MED ORDER — SODIUM CHLORIDE 0.9 % IV SOLN
INTRAVENOUS | Status: DC
  Administered 2017-04-07: 07:00:00 via INTRAVENOUS

## 2017-04-07 MED ORDER — FENTANYL CITRATE (PF) 100 MCG/2ML IJ SOLN
INTRAMUSCULAR | Status: DC | PRN
  Administered 2017-04-07: 25 ug via INTRAVENOUS
  Administered 2017-04-07: 50 ug via INTRAVENOUS

## 2017-04-07 MED ORDER — DEXAMETHASONE SODIUM PHOSPHATE 10 MG/ML IJ SOLN
INTRAMUSCULAR | Status: AC
  Filled 2017-04-07: qty 1

## 2017-04-07 MED ORDER — PROPOFOL 10 MG/ML IV BOLUS
INTRAVENOUS | Status: AC
  Filled 2017-04-07: qty 20

## 2017-04-07 MED ORDER — ONDANSETRON HCL 4 MG/2ML IJ SOLN
INTRAMUSCULAR | Status: DC | PRN
  Administered 2017-04-07: 4 mg via INTRAVENOUS

## 2017-04-07 MED ORDER — ONDANSETRON HCL 4 MG/2ML IJ SOLN
INTRAMUSCULAR | Status: AC
  Filled 2017-04-07: qty 2

## 2017-04-07 MED ORDER — LIDOCAINE HCL (CARDIAC) 20 MG/ML IV SOLN
INTRAVENOUS | Status: DC | PRN
  Administered 2017-04-07: 80 mg via INTRAVENOUS

## 2017-04-07 MED ORDER — LIDOCAINE-EPINEPHRINE (PF) 1 %-1:200000 IJ SOLN
INTRAMUSCULAR | Status: AC
  Filled 2017-04-07: qty 30

## 2017-04-07 MED ORDER — OXYCODONE HCL 5 MG PO TABS: 5.0000 mg | ORAL_TABLET | ORAL | 0 refills | Status: DC | PRN

## 2017-04-07 MED ORDER — FENTANYL CITRATE (PF) 250 MCG/5ML IJ SOLN
INTRAMUSCULAR | Status: AC
  Filled 2017-04-07: qty 5

## 2017-04-07 MED ORDER — SODIUM CHLORIDE 0.9 % IV SOLN
INTRAVENOUS | Status: DC | PRN
  Administered 2017-04-07: 07:00:00

## 2017-04-07 MED ORDER — PHENYLEPHRINE 40 MCG/ML (10ML) SYRINGE FOR IV PUSH (FOR BLOOD PRESSURE SUPPORT)
PREFILLED_SYRINGE | INTRAVENOUS | Status: AC
Start: 2017-04-07 — End: 2017-04-07
  Filled 2017-04-07: qty 10

## 2017-04-07 MED ORDER — CEFAZOLIN SODIUM-DEXTROSE 2-4 GM/100ML-% IV SOLN
2.0000 g | INTRAVENOUS | Status: AC
  Administered 2017-04-07: 2 g via INTRAVENOUS
  Filled 2017-04-07: qty 100

## 2017-04-07 MED ORDER — FENTANYL CITRATE (PF) 100 MCG/2ML IJ SOLN
25.0000 ug | INTRAMUSCULAR | Status: DC | PRN
  Administered 2017-04-07 (×2): 50 ug via INTRAVENOUS

## 2017-04-07 SURGICAL SUPPLY — 43 items
ARMBAND PINK RESTRICT EXTREMIT (MISCELLANEOUS) ×8 IMPLANT
CANISTER SUCT 3000ML PPV (MISCELLANEOUS) ×4 IMPLANT
CANNULA VESSEL 3MM 2 BLNT TIP (CANNULA) ×4 IMPLANT
CLIP TI WIDE RED SMALL 6 (CLIP) ×4 IMPLANT
CLIP VESOCCLUDE MED 6/CT (CLIP) ×4 IMPLANT
CLIP VESOCCLUDE SM WIDE 6/CT (CLIP) ×4 IMPLANT
COVER PROBE W GEL 5X96 (DRAPES) IMPLANT
DECANTER SPIKE VIAL GLASS SM (MISCELLANEOUS) ×4 IMPLANT
DERMABOND ADVANCED (GAUZE/BANDAGES/DRESSINGS) ×2
DERMABOND ADVANCED .7 DNX12 (GAUZE/BANDAGES/DRESSINGS) ×2 IMPLANT
ELECT REM PT RETURN 9FT ADLT (ELECTROSURGICAL) ×4
ELECTRODE REM PT RTRN 9FT ADLT (ELECTROSURGICAL) ×2 IMPLANT
GLOVE BIO SURGEON STRL SZ 6.5 (GLOVE) ×3 IMPLANT
GLOVE BIO SURGEON STRL SZ7.5 (GLOVE) ×4 IMPLANT
GLOVE BIO SURGEONS STRL SZ 6.5 (GLOVE) ×1
GLOVE BIOGEL PI IND STRL 6.5 (GLOVE) ×4 IMPLANT
GLOVE BIOGEL PI IND STRL 7.0 (GLOVE) ×2 IMPLANT
GLOVE BIOGEL PI IND STRL 7.5 (GLOVE) ×2 IMPLANT
GLOVE BIOGEL PI IND STRL 8 (GLOVE) ×2 IMPLANT
GLOVE BIOGEL PI INDICATOR 6.5 (GLOVE) ×4
GLOVE BIOGEL PI INDICATOR 7.0 (GLOVE) ×2
GLOVE BIOGEL PI INDICATOR 7.5 (GLOVE) ×2
GLOVE BIOGEL PI INDICATOR 8 (GLOVE) ×2
GLOVE ECLIPSE 7.0 STRL STRAW (GLOVE) ×4 IMPLANT
GLOVE SURG SS PI 6.5 STRL IVOR (GLOVE) ×4 IMPLANT
GOWN STRL REUS W/ TWL LRG LVL3 (GOWN DISPOSABLE) ×8 IMPLANT
GOWN STRL REUS W/TWL LRG LVL3 (GOWN DISPOSABLE) ×8
KIT BASIN OR (CUSTOM PROCEDURE TRAY) ×4 IMPLANT
KIT ROOM TURNOVER OR (KITS) ×4 IMPLANT
NEEDLE HYPO 25GX1X1/2 BEV (NEEDLE) IMPLANT
NS IRRIG 1000ML POUR BTL (IV SOLUTION) ×4 IMPLANT
PACK CV ACCESS (CUSTOM PROCEDURE TRAY) ×4 IMPLANT
PAD ARMBOARD 7.5X6 YLW CONV (MISCELLANEOUS) ×4 IMPLANT
SPONGE SURGIFOAM ABS GEL 100 (HEMOSTASIS) IMPLANT
SUT ETHILON 3 0 PS 1 (SUTURE) IMPLANT
SUT PROLENE 6 0 BV (SUTURE) ×4 IMPLANT
SUT SILK 0 TIES 10X30 (SUTURE) ×4 IMPLANT
SUT VIC AB 3-0 SH 27 (SUTURE) ×2
SUT VIC AB 3-0 SH 27X BRD (SUTURE) ×2 IMPLANT
SUT VICRYL 4-0 PS2 18IN ABS (SUTURE) ×4 IMPLANT
TOWEL GREEN STERILE (TOWEL DISPOSABLE) ×4 IMPLANT
UNDERPAD 30X30 (UNDERPADS AND DIAPERS) ×4 IMPLANT
WATER STERILE IRR 1000ML POUR (IV SOLUTION) ×4 IMPLANT

## 2017-04-07 NOTE — Anesthesia Preprocedure Evaluation (Addendum)
Anesthesia Evaluation  Patient identified by MRN, date of birth, ID band Patient awake    Reviewed: Allergy & Precautions, NPO status , Patient's Chart, lab work & pertinent test results  History of Anesthesia Complications Negative for: history of anesthetic complications  Airway Mallampati: II  TM Distance: >3 FB Neck ROM: Full    Dental no notable dental hx. (+) Teeth Intact   Pulmonary Current Smoker,    breath sounds clear to auscultation       Cardiovascular hypertension,  Rhythm:Regular Rate:Normal     Neuro/Psych    GI/Hepatic   Endo/Other  diabetes  Renal/GU ESRF and DialysisRenal disease     Musculoskeletal   Abdominal   Peds  Hematology  (+) anemia ,   Anesthesia Other Findings   Reproductive/Obstetrics                            Anesthesia Physical Anesthesia Plan  ASA: III  Anesthesia Plan: General   Post-op Pain Management:    Induction: Intravenous  PONV Risk Score and Plan: 1 and Ondansetron  Airway Management Planned: LMA  Additional Equipment:   Intra-op Plan:   Post-operative Plan: Extubation in OR  Informed Consent: I have reviewed the patients History and Physical, chart, labs and discussed the procedure including the risks, benefits and alternatives for the proposed anesthesia with the patient or authorized representative who has indicated his/her understanding and acceptance.     Plan Discussed with: CRNA  Anesthesia Plan Comments:        Anesthesia Quick Evaluation

## 2017-04-07 NOTE — Interval H&P Note (Signed)
History and Physical Interval Note:  04/07/2017 7:25 AM  [name] LemonsSylvester Whittle  has presented today for surgery, with the diagnosis of end stage renal disease  The various methods of treatment have been discussed with the patient and family. After consideration of risks, benefits and other options for treatment, the patient has consented to  Procedure(s): ARTERIOVENOUS (AV) BRACHIOCEPHALIC FISTULA CREATION (Left) LIGATION OF ARTERIOVENOUS  FISTULA RADIOCEPHALIC (Left) as a surgical intervention .  The patient's history has been reviewed, patient examined, no change in status, stable for surgery.  I have reviewed the patient's chart and labs.  Questions were answered to the patient's satisfaction.     Waverly Ferrarihristopher Kora Groom

## 2017-04-07 NOTE — Op Note (Signed)
    NAME: [name] Dudley    MRN: [id number] DOB: [birth year]    DATE OF OPERATION: 04/07/2017  PREOP DIAGNOSIS:    Stage V chronic kidney disease  POSTOP DIAGNOSIS:    Same  PROCEDURE:    1.  Creation of left brachiocephalic AV fistula 2.  Ligation of left radiocephalic AV fistula  SURGEON: Di Kindlehristopher S. Edilia Boickson, MD, FACS  ASSIST: Lianne CureMaureen Collins PA  ANESTHESIA: General  EBL: Minimal  INDICATIONS:    [name] Dudley is a 72 y.o. male who had a left radiocephalic fistula which was not maturing adequately.  We discussed possible fistulogram and venoplasty however the patient wished to proceed with placement of new access.  FINDINGS:   3.5 mm upper arm cephalic vein.  Brisk radial and ulnar signal at the completion of the procedure.  TECHNIQUE:   The patient was taken to the operating room and received a general anesthetic.  The left arm was prepped and draped in usual sterile fashion.  A transverse incision was made at the antecubital level and here the cephalic vein was dissected free.  It was ligated distally and irrigated up with heparinized saline.  It was a 3.5 mm vein.  The brachial artery was dissected free beneath the fascia.  Patient was heparinized.  The brachial artery was clamped proximally and distally and a longitudinal arteriotomy was made.  The vein was slightly spatulated and sewn end to side to the artery using continuous 6-0 Prolene suture.  At the completion was an excellent thrill in the fistula.  There was a radial and ulnar signal with the Doppler and a palpable but diminished radial pulse.  Next a small incision was made over the radiocephalic fistula.  This was dissected free and ligated with a 2-0 silk tie.  The heparin was partially reversed with protamine.  The site of leg ligation was closed with 4-0 Vicryl.  The antecubital incision was closed with a deep layer of 3-0 Vicryl and the skin closed with 4-0 Vicryl.  Dermabond was applied.  The patient tolerated  the procedure well and was transferred to the recovery room in stable condition.  All needle and sponge counts were correct.  Waverly Ferrarihristopher Erabella Kuipers, MD, FACS [facility]  DATE OF DICTATION:   04/07/2017

## 2017-04-07 NOTE — Transfer of Care (Signed)
Immediate Anesthesia Transfer of Care Note  Patient: Lavella LemonsSylvester Messerschmidt  Procedure(s) Performed: ARTERIOVENOUS (AV) BRACHIOCEPHALIC FISTULA CREATION (Left Arm Upper) LIGATION OF ARTERIOVENOUS  FISTULA RADIOCEPHALIC (Left Arm Lower)  Patient Location: PACU  Anesthesia Type:General  Level of Consciousness: awake and alert   Airway & Oxygen Therapy: Patient Spontanous Breathing and Patient connected to nasal cannula oxygen  Post-op Assessment: Report given to RN, Post -op Vital signs reviewed and stable and Patient moving all extremities X 4  Post vital signs: Reviewed and stable  Last Vitals:  Vitals:   04/07/17 0602 04/07/17 0603  BP:  (!) 176/66  Pulse: 68   Resp: 18   Temp: 36.7 C   SpO2: 99%     Last Pain:  Vitals:   04/07/17 0602  TempSrc: Oral      Patients Stated Pain Goal: 2 (04/07/17 0647)  Complications: No apparent anesthesia complications

## 2017-04-07 NOTE — Anesthesia Procedure Notes (Signed)
Procedure Name: LMA Insertion Date/Time: 04/07/2017 7:44 AM Performed by: Epifanio LeschesMercer, Daijha Leggio L, CRNA Pre-anesthesia Checklist: Patient identified, Emergency Drugs available, Suction available and Patient being monitored Patient Re-evaluated:Patient Re-evaluated prior to induction Oxygen Delivery Method: Circle System Utilized Preoxygenation: Pre-oxygenation with 100% oxygen Induction Type: IV induction Ventilation: Mask ventilation without difficulty LMA: LMA inserted LMA Size: 4.0 Number of attempts: 1 Placement Confirmation: positive ETCO2 Tube secured with: Tape Dental Injury: Teeth and Oropharynx as per pre-operative assessment

## 2017-04-07 NOTE — Anesthesia Postprocedure Evaluation (Signed)
Anesthesia Post Note  Patient: Lavella LemonsSylvester Vorndran  Procedure(s) Performed: ARTERIOVENOUS (AV) BRACHIOCEPHALIC FISTULA CREATION (Left Arm Upper) LIGATION OF ARTERIOVENOUS  FISTULA RADIOCEPHALIC (Left Arm Lower)     Patient location during evaluation: PACU Anesthesia Type: General Level of consciousness: awake and alert Pain management: pain level controlled Vital Signs Assessment: post-procedure vital signs reviewed and stable Respiratory status: spontaneous breathing, nonlabored ventilation, respiratory function stable and patient connected to nasal cannula oxygen Cardiovascular status: blood pressure returned to baseline and stable Postop Assessment: no apparent nausea or vomiting Anesthetic complications: no    Last Vitals:  Vitals:   04/07/17 0950 04/07/17 1002  BP: (!) 160/92 (!) 162/90  Pulse: 88   Resp: 16 17  Temp: 36.5 C 36.5 C  SpO2: 93% 94%    Last Pain:  Vitals:   04/07/17 1002  TempSrc:   PainSc: 2                  Sahmir Weatherbee,JAMES TERRILL

## 2017-04-08 ENCOUNTER — Encounter (HOSPITAL_COMMUNITY): Payer: Self-pay | Admitting: Vascular Surgery

## 2017-04-08 ENCOUNTER — Telehealth: Payer: Self-pay | Admitting: Vascular Surgery

## 2017-04-08 NOTE — Telephone Encounter (Signed)
-----   Message from Sharee PimpleMarilyn K McChesney, RN sent at 04/07/2017 10:39 AM EDT ----- Regarding: 6 weeks w/ duplex   ----- Message ----- From: Chuck Hintickson, Christopher S, MD Sent: 04/07/2017   8:49 AM To: Vvs Charge Pool Subject: charge and f/u                                  PROCEDURE:   1.  Creation of left brachiocephalic AV fistula 2.  Ligation of left radiocephalic AV fistula  SURGEON: Di Kindlehristopher S. Edilia Boickson, MD, FACS  ASSIST: Lianne CureMaureen Collins PA  He needs a follow-up visit in 6 weeks with a duplex at that time to check on the maturation of the fistula.thank you CD

## 2017-04-08 NOTE — Telephone Encounter (Signed)
Spoke to pt re 05/28/17 appt. Mailed letter. 04/08/17  LS

## 2017-05-27 ENCOUNTER — Other Ambulatory Visit: Payer: Self-pay

## 2017-05-27 DIAGNOSIS — Z48812 Encounter for surgical aftercare following surgery on the circulatory system: Secondary | ICD-10-CM

## 2017-05-27 DIAGNOSIS — Z992 Dependence on renal dialysis: Principal | ICD-10-CM

## 2017-05-27 DIAGNOSIS — N186 End stage renal disease: Secondary | ICD-10-CM

## 2017-05-28 ENCOUNTER — Encounter: Payer: Self-pay | Admitting: *Deleted

## 2017-05-28 ENCOUNTER — Other Ambulatory Visit: Payer: Self-pay

## 2017-05-28 ENCOUNTER — Ambulatory Visit (HOSPITAL_COMMUNITY)
Admission: RE | Admit: 2017-05-28 | Discharge: 2017-05-28 | Disposition: A | Discharge status: Home / Self Care | Payer: Non-veteran care | Source: Ambulatory Visit | Attending: Vascular Surgery | Admitting: Vascular Surgery

## 2017-05-28 ENCOUNTER — Ambulatory Visit (INDEPENDENT_AMBULATORY_CARE_PROVIDER_SITE_OTHER): Payer: Self-pay | Admitting: Vascular Surgery

## 2017-05-28 ENCOUNTER — Encounter: Payer: Self-pay | Admitting: Vascular Surgery

## 2017-05-28 ENCOUNTER — Other Ambulatory Visit: Payer: Self-pay | Admitting: *Deleted

## 2017-05-28 VITALS — BP 170/83 | HR 56 | Temp 98.0°F | Resp 20 | Ht 67.0 in | Wt 165.0 lb

## 2017-05-28 DIAGNOSIS — N186 End stage renal disease: Secondary | ICD-10-CM | POA: Diagnosis not present

## 2017-05-28 DIAGNOSIS — Z48812 Encounter for surgical aftercare following surgery on the circulatory system: Secondary | ICD-10-CM

## 2017-05-28 DIAGNOSIS — Z992 Dependence on renal dialysis: Secondary | ICD-10-CM | POA: Diagnosis not present

## 2017-05-28 NOTE — H&P (View-Only) (Signed)
Patient name: Arliss Frisina MRN: [id number] DOB: [birth year] Sex: male  REASON FOR VISIT:   Follow-up after left brachiocephalic AV fistula  HPI:   Conrado Nance is a pleasant 72 y.o. male who had a non-maturing left radiocephalic fistula.  On 04/07/2017 I ligated his left radiocephalic fistula and placed a left brachiocephalic fistula.  He comes in for a follow-up visit.  He denies any pain or paresthesias in his left arm.  Dialyzes on Tuesdays Thursdays and Saturdays.  He has a functioning catheter in the right IJ.  Current Outpatient Medications  Medication Sig Dispense Refill  . amLODipine (NORVASC) 10 MG tablet Take 10 mg daily by mouth.    . Cholecalciferol (VITAMIN D-3) 1000 units CAPS Take 1,000 Units daily by mouth.    . cloNIDine (CATAPRES) 0.1 MG tablet Take 0.1 mg 2 (two) times daily by mouth.    . ferrous sulfate 325 (65 FE) MG tablet Take 325 mg 3 (three) times daily with meals by mouth.    . gabapentin (NEURONTIN) 400 MG capsule Take 400 mg at bedtime by mouth.    . Hypromellose (ARTIFICIAL TEARS OP) Apply 1 drop to eye daily as needed (dry eyes).    . insulin glargine (LANTUS) 100 UNIT/ML injection Inject 4 Units at bedtime into the skin.     . mirtazapine (REMERON) 15 MG tablet Take 15 mg at bedtime by mouth.    . oxyCODONE (ROXICODONE) 5 MG immediate release tablet Take 1 tablet (5 mg total) by mouth every 4 (four) hours as needed for severe pain. 15 tablet 0  . sildenafil (VIAGRA) 100 MG tablet Take 100 mg daily as needed by mouth for erectile dysfunction.    . sodium bicarbonate 650 MG tablet Take 650 mg every 8 (eight) hours by mouth.    . terazosin (HYTRIN) 5 MG capsule Take 5 mg at bedtime by mouth.     No current facility-administered medications for this visit.     REVIEW OF SYSTEMS:   denotes positive finding,  denotes negative finding Cardiac  Comments:  Chest pain or chest pressure:    Shortness of breath upon exertion:    Short of breath when  lying flat:    Irregular heart rhythm:    Constitutional    Fever or chills:     PHYSICAL EXAM:   Vitals:   05/28/17 1532 05/28/17 1536  BP: (!) 171/81 (!) 170/83  Pulse: (!) 56   Resp: 20   Temp: 98 F (36.7 C)   TempSrc: Oral   SpO2: 96%   Weight: 165 lb (74.8 kg)   Height:  (1.702 m)     GENERAL: The patient is a well-nourished male, in no acute distress. The vital signs are documented above. CARDIOVASCULAR: There is a regular rate and rhythm. PULMONARY: There is good air exchange bilaterally without wheezing or rales. His fistula has a good thrill proximally but is not especially large and is also somewhat tortuous.  DATA:   DUPLEX LEFT AV FISTULA: I have independently interpreted his duplex of his left AV fistula.  The diameters of the fistula range from 0.36-0.65 cm.  There are elevated velocities noted in the mid upper arm and the distal upper arm.  There also some competing branches.  MEDICAL ISSUES:   POORLY MATURING LEFT UPPER ARM FISTULA: His left upper arm fistula is fairly small.  It is also somewhat tortuous.  There is some elevated velocities suggesting intimal hyperplasia within the vein and I have  recommended that we proceed with a fistulogram to further evaluate this.  If he has disease amenable to venoplasty this could potentially be addressed at the same time.  I have discussed the indications for the procedure and the potential complications and he is agreeable to proceed.  His fistulogram has been scheduled for 06/13/2017 although because of some scheduling changes this will either be done by Dr. Darrick Penna or I will have to do it on 06/21/2015.  Waverly Ferrari [facility] [phone]

## 2017-05-28 NOTE — Progress Notes (Signed)
Letter of instructions with corrected dates faxed to [facility]. Attention Rodney Booze who will reviews and give to patient.

## 2017-05-28 NOTE — Progress Notes (Signed)
 Patient name: [name] Dudley MRN: [id number] DOB: 09/13/1945 Sex: male  REASON FOR VISIT:   Follow-up after left brachiocephalic AV fistula  HPI:   [name] Dudley is a pleasant 72 y.o. male who had a non-maturing left radiocephalic fistula.  On 04/07/2017 I ligated his left radiocephalic fistula and placed a left brachiocephalic fistula.  He comes in for a follow-up visit.  He denies any pain or paresthesias in his left arm.  Dialyzes on Tuesdays Thursdays and Saturdays.  He has a functioning catheter in the right IJ.  Current Outpatient Medications  Medication Sig Dispense Refill  . amLODipine (NORVASC) 10 MG tablet Take 10 mg daily by mouth.    . Cholecalciferol (VITAMIN D-3) 1000 units CAPS Take 1,000 Units daily by mouth.    . cloNIDine (CATAPRES) 0.1 MG tablet Take 0.1 mg 2 (two) times daily by mouth.    . ferrous sulfate 325 (65 FE) MG tablet Take 325 mg 3 (three) times daily with meals by mouth.    . gabapentin (NEURONTIN) 400 MG capsule Take 400 mg at bedtime by mouth.    . Hypromellose (ARTIFICIAL TEARS OP) Apply 1 drop to eye daily as needed (dry eyes).    . insulin glargine (LANTUS) 100 UNIT/ML injection Inject 4 Units at bedtime into the skin.     . mirtazapine (REMERON) 15 MG tablet Take 15 mg at bedtime by mouth.    . oxyCODONE (ROXICODONE) 5 MG immediate release tablet Take 1 tablet (5 mg total) by mouth every 4 (four) hours as needed for severe pain. 15 tablet 0  . sildenafil (VIAGRA) 100 MG tablet Take 100 mg daily as needed by mouth for erectile dysfunction.    . sodium bicarbonate 650 MG tablet Take 650 mg every 8 (eight) hours by mouth.    . terazosin (HYTRIN) 5 MG capsule Take 5 mg at bedtime by mouth.     No current facility-administered medications for this visit.     REVIEW OF SYSTEMS:  [X] denotes positive finding, [ ] denotes negative finding Cardiac  Comments:  Chest pain or chest pressure:    Shortness of breath upon exertion:    Short of breath when  lying flat:    Irregular heart rhythm:    Constitutional    Fever or chills:     PHYSICAL EXAM:   Vitals:   05/28/17 1532 05/28/17 1536  BP: (!) 171/81 (!) 170/83  Pulse: (!) 56   Resp: 20   Temp: 98 F (36.7 C)   TempSrc: Oral   SpO2: 96%   Weight: 165 lb (74.8 kg)   Height: 5' 7" (1.702 m)     GENERAL: The patient is a well-nourished male, in no acute distress. The vital signs are documented above. CARDIOVASCULAR: There is a regular rate and rhythm. PULMONARY: There is good air exchange bilaterally without wheezing or rales. His fistula has a good thrill proximally but is not especially large and is also somewhat tortuous.  DATA:   DUPLEX LEFT AV FISTULA: I have independently interpreted his duplex of his left AV fistula.  The diameters of the fistula range from 0.36-0.65 cm.  There are elevated velocities noted in the mid upper arm and the distal upper arm.  There also some competing branches.  MEDICAL ISSUES:   POORLY MATURING LEFT UPPER ARM FISTULA: His left upper arm fistula is fairly small.  It is also somewhat tortuous.  There is some elevated velocities suggesting intimal hyperplasia within the vein and I have   recommended that we proceed with a fistulogram to further evaluate this.  If he has disease amenable to venoplasty this could potentially be addressed at the same time.  I have discussed the indications for the procedure and the potential complications and he is agreeable to proceed.  His fistulogram has been scheduled for 06/13/2017 although because of some scheduling changes this will either be done by Dr. Darrick Penna or I will have to do it on 06/21/2015.  Waverly Ferrari [facility] [phone]

## 2017-05-29 ENCOUNTER — Other Ambulatory Visit: Payer: Self-pay | Admitting: *Deleted

## 2017-05-29 NOTE — Progress Notes (Signed)
Confirmed with [facility] they received the pro-procedure instructions for patient will review then give to patient.

## 2017-06-13 ENCOUNTER — Encounter (HOSPITAL_COMMUNITY): Payer: Self-pay | Admitting: Vascular Surgery

## 2017-06-13 ENCOUNTER — Encounter (HOSPITAL_COMMUNITY)
Admission: RE | Disposition: A | Discharge status: Home / Self Care | Payer: Self-pay | Source: Ambulatory Visit | Attending: Vascular Surgery

## 2017-06-13 ENCOUNTER — Telehealth: Payer: Self-pay | Admitting: Vascular Surgery

## 2017-06-13 ENCOUNTER — Ambulatory Visit (HOSPITAL_COMMUNITY)
Admission: RE | Admit: 2017-06-13 | Discharge: 2017-06-13 | Disposition: A | Discharge status: Home / Self Care | Payer: Non-veteran care | Source: Ambulatory Visit | Attending: Vascular Surgery | Admitting: Vascular Surgery

## 2017-06-13 DIAGNOSIS — Y832 Surgical operation with anastomosis, bypass or graft as the cause of abnormal reaction of the patient, or of later complication, without mention of misadventure at the time of the procedure: Secondary | ICD-10-CM | POA: Insufficient documentation

## 2017-06-13 DIAGNOSIS — T82898A Other specified complication of vascular prosthetic devices, implants and grafts, initial encounter: Secondary | ICD-10-CM

## 2017-06-13 DIAGNOSIS — T82858A Stenosis of vascular prosthetic devices, implants and grafts, initial encounter: Secondary | ICD-10-CM | POA: Insufficient documentation

## 2017-06-13 DIAGNOSIS — N186 End stage renal disease: Secondary | ICD-10-CM

## 2017-06-13 DIAGNOSIS — Z992 Dependence on renal dialysis: Secondary | ICD-10-CM

## 2017-06-13 HISTORY — PX: A/V FISTULAGRAM: CATH118298

## 2017-06-13 HISTORY — PX: PERIPHERAL VASCULAR BALLOON ANGIOPLASTY: CATH118281

## 2017-06-13 LAB — POCT I-STAT, CHEM 8
BUN: 19 mg/dL (ref 6–20)
CREATININE: 6.3 mg/dL — AB (ref 0.61–1.24)
Calcium, Ion: 0.95 mmol/L — ABNORMAL LOW (ref 1.15–1.40)
Chloride: 97 mmol/L — ABNORMAL LOW (ref 101–111)
Glucose, Bld: 92 mg/dL (ref 65–99)
HEMATOCRIT: 38 % — AB (ref 39.0–52.0)
Hemoglobin: 12.9 g/dL — ABNORMAL LOW (ref 13.0–17.0)
POTASSIUM: 4.3 mmol/L (ref 3.5–5.1)
Sodium: 136 mmol/L (ref 135–145)
TCO2: 28 mmol/L (ref 22–32)

## 2017-06-13 LAB — GLUCOSE, CAPILLARY: Glucose-Capillary: 93 mg/dL (ref 65–99)

## 2017-06-13 SURGERY — A/V FISTULAGRAM
Anesthesia: LOCAL

## 2017-06-13 MED ORDER — HEPARIN SODIUM (PORCINE) 1000 UNIT/ML IJ SOLN
INTRAMUSCULAR | Status: DC | PRN
  Administered 2017-06-13: 3000 [IU] via INTRAVENOUS

## 2017-06-13 MED ORDER — MIDAZOLAM HCL 2 MG/2ML IJ SOLN
INTRAMUSCULAR | Status: DC | PRN
  Administered 2017-06-13: 2 mg via INTRAVENOUS

## 2017-06-13 MED ORDER — SODIUM CHLORIDE 0.9% FLUSH: 3.0000 mL | Freq: Two times a day (BID) | INTRAVENOUS | Status: DC

## 2017-06-13 MED ORDER — LIDOCAINE HCL (PF) 1 % IJ SOLN
INTRAMUSCULAR | Status: DC | PRN
  Administered 2017-06-13: 5 mL

## 2017-06-13 MED ORDER — ONDANSETRON HCL 4 MG/2ML IJ SOLN: 4.0000 mg | Freq: Four times a day (QID) | INTRAMUSCULAR | Status: DC | PRN

## 2017-06-13 MED ORDER — FENTANYL CITRATE (PF) 100 MCG/2ML IJ SOLN
INTRAMUSCULAR | Status: DC | PRN
  Administered 2017-06-13: 25 ug via INTRAVENOUS

## 2017-06-13 MED ORDER — HEPARIN (PORCINE) IN NACL 2-0.9 UNITS/ML
INTRAMUSCULAR | Status: AC | PRN
  Administered 2017-06-13: 500 mL

## 2017-06-13 MED ORDER — SODIUM CHLORIDE 0.9 % IV SOLN: 250.0000 mL | INTRAVENOUS | Status: DC | PRN

## 2017-06-13 MED ORDER — LIDOCAINE HCL (PF) 1 % IJ SOLN
INTRAMUSCULAR | Status: AC
  Filled 2017-06-13: qty 30

## 2017-06-13 MED ORDER — MIDAZOLAM HCL 2 MG/2ML IJ SOLN
INTRAMUSCULAR | Status: AC
  Filled 2017-06-13: qty 2

## 2017-06-13 MED ORDER — ACETAMINOPHEN 325 MG PO TABS: 650.0000 mg | ORAL_TABLET | ORAL | Status: DC | PRN

## 2017-06-13 MED ORDER — SODIUM CHLORIDE 0.9% FLUSH: 3.0000 mL | INTRAVENOUS | Status: DC | PRN

## 2017-06-13 MED ORDER — HYDRALAZINE HCL 20 MG/ML IJ SOLN: 5.0000 mg | INTRAMUSCULAR | Status: DC | PRN

## 2017-06-13 MED ORDER — FENTANYL CITRATE (PF) 100 MCG/2ML IJ SOLN
INTRAMUSCULAR | Status: AC
  Filled 2017-06-13: qty 2

## 2017-06-13 MED ORDER — HEPARIN (PORCINE) IN NACL 1000-0.9 UT/500ML-% IV SOLN
INTRAVENOUS | Status: AC
  Filled 2017-06-13: qty 500

## 2017-06-13 MED ORDER — OXYCODONE HCL 5 MG PO TABS: 5.0000 mg | ORAL_TABLET | ORAL | Status: DC | PRN

## 2017-06-13 MED ORDER — IODIXANOL 320 MG/ML IV SOLN
INTRAVENOUS | Status: DC | PRN
  Administered 2017-06-13: 30 mL via INTRAVENOUS

## 2017-06-13 MED ORDER — HEPARIN SODIUM (PORCINE) 1000 UNIT/ML IJ SOLN
INTRAMUSCULAR | Status: AC
  Filled 2017-06-13: qty 1

## 2017-06-13 MED ORDER — LABETALOL HCL 5 MG/ML IV SOLN: 10.0000 mg | INTRAVENOUS | Status: DC | PRN

## 2017-06-13 SURGICAL SUPPLY — 19 items
BAG SNAP BAND KOVER 36X36 (MISCELLANEOUS) ×3 IMPLANT
BALLN MUSTANG 5.0X20 75 (BALLOONS) ×3
BALLN MUSTANG 7.0X20 75 (BALLOONS) ×3
BALLN MUSTANG 7.0X40 75 (BALLOONS) ×3
BALLOON MUSTANG 5.0X20 75 (BALLOONS) ×2 IMPLANT
BALLOON MUSTANG 7.0X20 75 (BALLOONS) ×2 IMPLANT
BALLOON MUSTANG 7.0X40 75 (BALLOONS) ×2 IMPLANT
COVER DOME SNAP 22 D (MISCELLANEOUS) ×3 IMPLANT
COVER PRB 48X5XTLSCP FOLD TPE (BAG) ×2 IMPLANT
COVER PROBE 5X48 (BAG) ×1
KIT ENCORE 26 ADVANTAGE (KITS) ×3 IMPLANT
KIT MICROPUNCTURE NIT STIFF (SHEATH) ×3 IMPLANT
PROTECTION STATION PRESSURIZED (MISCELLANEOUS) ×3
SHEATH PINNACLE R/O II 6F 4CM (SHEATH) ×3 IMPLANT
STATION PROTECTION PRESSURIZED (MISCELLANEOUS) ×2 IMPLANT
STOPCOCK MORSE 400PSI 3WAY (MISCELLANEOUS) ×3 IMPLANT
TRAY PV CATH (CUSTOM PROCEDURE TRAY) ×3 IMPLANT
TUBING CIL FLEX 10 FLL-RA (TUBING) ×3 IMPLANT
WIRE BENTSON .035X145CM (WIRE) ×3 IMPLANT

## 2017-06-13 NOTE — Telephone Encounter (Signed)
-----   Message from Sherren Kerns, MD sent at 06/13/2017  8:24 AM EDT -----  [place] left arm angioplasty left arm AVF Please arrange follow up with Edilia Bo in 2-3 weeks with fistula duplex to reassess maturity  Fabienne Bruns

## 2017-06-13 NOTE — Interval H&P Note (Signed)
History and Physical Interval Note:  06/13/2017 7:34 AM  [name] Dudley  has presented today for surgery, with the diagnosis of complication w/ fistula  The various methods of treatment have been discussed with the patient and family. After consideration of risks, benefits and other options for treatment, the patient has consented to  Procedure(s): A/V FISTULAGRAM - Left AV (N/A) as a surgical intervention .  The patient's history has been reviewed, patient examined, no change in status, stable for surgery.  I have reviewed the patient's chart and labs.  Questions were answered to the patient's satisfaction.     Fabienne Bruns

## 2017-06-13 NOTE — Progress Notes (Signed)
Pt declines wheeled chair push out to lobby, escorted to main hallway without incident.

## 2017-06-13 NOTE — Discharge Instructions (Signed)

## 2017-06-13 NOTE — Telephone Encounter (Signed)
Sched lab 06/30/17 at 4:00 and MD 07/02/17 at 1:45. Spoke to pt inform them of appts.

## 2017-06-13 NOTE — Op Note (Addendum)
Procedure: Left arm fistulogram with angioplasty of left arm AV fistula  Preoperative diagnosis: Poorly functioning left arm AV fistula  Postoperative diagnosis: Same  Anesthesia: Local with IV sedation  Operative findings: #1 diffuse multisegment areas of narrowing angioplastied to 7 mm diameter  Operative details: After obtaining informed consent, patient was taken to the PV lab.  The patient was placed in supine position Angio table.  Patient's left upper extremity was prepped and draped in usual sterile fashion.  Ultrasound was used to identify the patient's left arm AV fistula.  A permanent image was recorded and placed in the patient's chart.  Ultrasound showed the fistula to be patent and using ultrasound guidance local anesthesia was infiltrated over the fistula and a micropuncture needle used to cannulate the fistula without difficulty.  The micropuncture wire was advanced into the midportion of the fistula.  The micropuncture sheath was placed over this.  Contrast angiogram was then obtained.  Showed the central veins to be widely patent.  There were multiple segments of narrowing in the AV fistula throughout the upper arm.  The most narrowed portion was about 2 cm from the origin of the fistula.  At this point the patient was given 3000 units of intravenous heparin.  The micropuncture sheath was swapped out over a Bentson wire for a 6 [place] short sheath.  We then proceeded to angioplasty the entire fistula from the shoulder all the way down to the elbow level initially using a 5 mm x 2 cm diameter balloon.  The lesion near the elbow was fairly resistant.  I eventually upsized to a 7 mm x 4 cm balloon and angioplastied the fistula from the shoulder level all the way down to the elbow.  Completion angiogram was performed which showed wide patency of the fistula decreased amount of collateralization in the midportion in the proximal aspect of the fistula seemed significantly improved with  essentially 0% residual stenosis.  At this point a Monocryl figure-of-eight stitch was placed at the skin exit site and the sheath removed and hemostasis pain with direct pressure.  Patient tolerated the procedure well and there were no complications.  The patient was taken to the holding area in stable condition.  I was present for and monitored the IV sedation which consisted of approximately 15 to 20 minutes.  Operative management: The patient will be scheduled to see Dr. Durwin Nora in follow-up in 2 to 3 weeks to see if the fistula is ready for cannulation at that point.  Fabienne Bruns, MD [facility] [place] Office: [phone] Pager: [phone]

## 2017-06-24 ENCOUNTER — Other Ambulatory Visit: Payer: Self-pay

## 2017-06-24 DIAGNOSIS — Z992 Dependence on renal dialysis: Principal | ICD-10-CM

## 2017-06-24 DIAGNOSIS — Z48812 Encounter for surgical aftercare following surgery on the circulatory system: Secondary | ICD-10-CM

## 2017-06-24 DIAGNOSIS — N186 End stage renal disease: Secondary | ICD-10-CM

## 2017-06-25 ENCOUNTER — Other Ambulatory Visit: Payer: Self-pay

## 2017-06-30 ENCOUNTER — Ambulatory Visit (HOSPITAL_COMMUNITY)
Admission: RE | Admit: 2017-06-30 | Discharge: 2017-06-30 | Disposition: A | Discharge status: Home / Self Care | Payer: No Typology Code available for payment source | Source: Ambulatory Visit | Attending: Surgery | Admitting: Surgery

## 2017-06-30 DIAGNOSIS — Y832 Surgical operation with anastomosis, bypass or graft as the cause of abnormal reaction of the patient, or of later complication, without mention of misadventure at the time of the procedure: Secondary | ICD-10-CM | POA: Insufficient documentation

## 2017-06-30 DIAGNOSIS — Z48812 Encounter for surgical aftercare following surgery on the circulatory system: Secondary | ICD-10-CM | POA: Diagnosis not present

## 2017-06-30 DIAGNOSIS — N186 End stage renal disease: Secondary | ICD-10-CM | POA: Insufficient documentation

## 2017-06-30 DIAGNOSIS — Z992 Dependence on renal dialysis: Secondary | ICD-10-CM | POA: Diagnosis not present

## 2017-06-30 DIAGNOSIS — T82858A Stenosis of vascular prosthetic devices, implants and grafts, initial encounter: Secondary | ICD-10-CM | POA: Diagnosis not present

## 2017-07-02 ENCOUNTER — Other Ambulatory Visit: Payer: Self-pay

## 2017-07-02 ENCOUNTER — Ambulatory Visit (INDEPENDENT_AMBULATORY_CARE_PROVIDER_SITE_OTHER): Payer: No Typology Code available for payment source | Admitting: Vascular Surgery

## 2017-07-02 ENCOUNTER — Encounter: Payer: Self-pay | Admitting: Vascular Surgery

## 2017-07-02 VITALS — BP 149/82 | HR 66 | Temp 98.4°F | Resp 20 | Ht 67.0 in | Wt 184.0 lb

## 2017-07-02 DIAGNOSIS — Z992 Dependence on renal dialysis: Secondary | ICD-10-CM

## 2017-07-02 DIAGNOSIS — N186 End stage renal disease: Secondary | ICD-10-CM

## 2017-07-02 NOTE — Progress Notes (Signed)
Patient name: [name] Dudley MRN: [id number] DOB: 1945/08/06 Sex: male  REASON FOR VISIT:   Follow-up after angioplasty of left arm AV fistula.  HPI:   [name] Dudley is a pleasant 72 y.o. male who had a poorly functioning left arm fistula.  He underwent angioplasty of this by Dr. Fabienne Bruns on 06/13/2017.  He comes in for a follow-up visit.  I reviewed his operative report.  The patient had diffuse narrowing in multiple areas and the fistula was ballooned to a 7 mm diameter.  The entire fistula was ballooned from the shoulder all the way down to the elbow initially with a 5 mm balloon and then upsized to a 7 mm balloon.  There was improved flow in the fistula at the completion.  He continues to use his tunneled dialysis catheter for hemodialysis.  Of note this fistula was originally placed in March of this year.  Thus it is about 3 months out.  Current Outpatient Medications  Medication Sig Dispense Refill  . amLODipine (NORVASC) 10 MG tablet Take 10 mg daily by mouth.    . Cholecalciferol (VITAMIN D-3) 1000 units CAPS Take 1,000 Units daily by mouth.    . cloNIDine (CATAPRES) 0.1 MG tablet Take 0.1 mg 2 (two) times daily by mouth.    . ferric citrate (AURYXIA) 1 GM 210 MG(Fe) tablet Take 630 mg by mouth 3 (three) times daily with meals.    . ferrous sulfate 325 (65 FE) MG tablet Take 325 mg 3 (three) times daily with meals by mouth.    . gabapentin (NEURONTIN) 400 MG capsule Take 400 mg at bedtime by mouth.    . hydrALAZINE (APRESOLINE) 50 MG tablet Take 50 mg by mouth daily.    . Hypromellose (ARTIFICIAL TEARS OP) Apply 1 drop to eye daily as needed (dry eyes).    . insulin glargine (LANTUS) 100 UNIT/ML injection Inject 4 Units at bedtime into the skin.     . sildenafil (VIAGRA) 100 MG tablet Take 100 mg daily as needed by mouth for erectile dysfunction.    . sodium bicarbonate 650 MG tablet Take 650 mg every 8 (eight) hours by mouth.    . terazosin (HYTRIN) 5 MG capsule Take 5 mg at  bedtime by mouth.     No current facility-administered medications for this visit.     REVIEW OF SYSTEMS:  [X]  denotes positive finding, [ ]  denotes negative finding Cardiac  Comments:  Chest pain or chest pressure:    Shortness of breath upon exertion:    Short of breath when lying flat:    Irregular heart rhythm:    Constitutional    Fever or chills:     PHYSICAL EXAM:   Vitals:   07/02/17 1351  BP: (!) 149/82  Pulse: 66  Resp: 20  Temp: 98.4 F (36.9 C)  TempSrc: Oral  SpO2: 95%  Weight: 184 lb (83.5 kg)  Height: 5\' 7"  (1.702 m)    GENERAL: The patient is a well-nourished male, in no acute distress. The vital signs are documented above. CARDIOVASCULAR: There is a regular rate and rhythm. PULMONARY: There is good air exchange bilaterally without wheezing or rales. His fistula has a good thrill proximally.  The vein is not especially large in the mid upper arm.  DATA:   Duplex scan on 06/30/2017 showed that the diameters of the fistula had improved compared to his previous study.  Diameters range from 0.55-0.68 cm..   MEDICAL ISSUES:   STATUS POST LEFT BRACHIOCEPHALIC  AV FISTULA: The fistula has improved in size.  He is only 3 months out.  I think we should give it another month to continue to enlarge and at that point I think they can begin to cannulate his left upper arm fistula.  Once we know that this is working well arrangements can be made to remove his catheter.  I will see him back as needed.  Waverly Ferrarihristopher Burak Zerbe [facility] Beeper [phone]

## 2017-07-12 ENCOUNTER — Other Ambulatory Visit: Payer: Self-pay

## 2017-07-12 ENCOUNTER — Inpatient Hospital Stay (HOSPITAL_COMMUNITY)
Admission: EM | Admit: 2017-07-12 | Discharge: 2017-07-28 | DRG: 870 | Disposition: E | Payer: Medicare Other | Attending: Pulmonary Disease | Admitting: Pulmonary Disease

## 2017-07-12 ENCOUNTER — Emergency Department (HOSPITAL_COMMUNITY): Payer: Medicare Other

## 2017-07-12 DIAGNOSIS — K760 Fatty (change of) liver, not elsewhere classified: Secondary | ICD-10-CM | POA: Diagnosis present

## 2017-07-12 DIAGNOSIS — I469 Cardiac arrest, cause unspecified: Secondary | ICD-10-CM | POA: Diagnosis not present

## 2017-07-12 DIAGNOSIS — J9620 Acute and chronic respiratory failure, unspecified whether with hypoxia or hypercapnia: Secondary | ICD-10-CM

## 2017-07-12 DIAGNOSIS — E1140 Type 2 diabetes mellitus with diabetic neuropathy, unspecified: Secondary | ICD-10-CM | POA: Diagnosis present

## 2017-07-12 DIAGNOSIS — E1122 Type 2 diabetes mellitus with diabetic chronic kidney disease: Secondary | ICD-10-CM | POA: Diagnosis present

## 2017-07-12 DIAGNOSIS — D72819 Decreased white blood cell count, unspecified: Secondary | ICD-10-CM | POA: Diagnosis present

## 2017-07-12 DIAGNOSIS — Z515 Encounter for palliative care: Secondary | ICD-10-CM | POA: Diagnosis not present

## 2017-07-12 DIAGNOSIS — G9341 Metabolic encephalopathy: Secondary | ICD-10-CM | POA: Diagnosis not present

## 2017-07-12 DIAGNOSIS — Z7189 Other specified counseling: Secondary | ICD-10-CM | POA: Diagnosis not present

## 2017-07-12 DIAGNOSIS — I959 Hypotension, unspecified: Secondary | ICD-10-CM | POA: Diagnosis not present

## 2017-07-12 DIAGNOSIS — J9601 Acute respiratory failure with hypoxia: Secondary | ICD-10-CM | POA: Diagnosis not present

## 2017-07-12 DIAGNOSIS — J14 Pneumonia due to Hemophilus influenzae: Secondary | ICD-10-CM | POA: Diagnosis present

## 2017-07-12 DIAGNOSIS — N400 Enlarged prostate without lower urinary tract symptoms: Secondary | ICD-10-CM | POA: Diagnosis present

## 2017-07-12 DIAGNOSIS — I4892 Unspecified atrial flutter: Secondary | ICD-10-CM | POA: Diagnosis present

## 2017-07-12 DIAGNOSIS — G9340 Encephalopathy, unspecified: Secondary | ICD-10-CM

## 2017-07-12 DIAGNOSIS — I4891 Unspecified atrial fibrillation: Secondary | ICD-10-CM | POA: Diagnosis present

## 2017-07-12 DIAGNOSIS — D631 Anemia in chronic kidney disease: Secondary | ICD-10-CM | POA: Diagnosis present

## 2017-07-12 DIAGNOSIS — N186 End stage renal disease: Secondary | ICD-10-CM | POA: Diagnosis present

## 2017-07-12 DIAGNOSIS — J9621 Acute and chronic respiratory failure with hypoxia: Secondary | ICD-10-CM | POA: Diagnosis present

## 2017-07-12 DIAGNOSIS — R4182 Altered mental status, unspecified: Secondary | ICD-10-CM | POA: Diagnosis not present

## 2017-07-12 DIAGNOSIS — Z66 Do not resuscitate: Secondary | ICD-10-CM | POA: Diagnosis not present

## 2017-07-12 DIAGNOSIS — E872 Acidosis: Secondary | ICD-10-CM | POA: Diagnosis not present

## 2017-07-12 DIAGNOSIS — R402322 Coma scale, best motor response, extension, at arrival to emergency department: Secondary | ICD-10-CM | POA: Diagnosis present

## 2017-07-12 DIAGNOSIS — R570 Cardiogenic shock: Secondary | ICD-10-CM | POA: Diagnosis not present

## 2017-07-12 DIAGNOSIS — Z87891 Personal history of nicotine dependence: Secondary | ICD-10-CM

## 2017-07-12 DIAGNOSIS — R092 Respiratory arrest: Secondary | ICD-10-CM

## 2017-07-12 DIAGNOSIS — R402112 Coma scale, eyes open, never, at arrival to emergency department: Secondary | ICD-10-CM | POA: Diagnosis present

## 2017-07-12 DIAGNOSIS — J9602 Acute respiratory failure with hypercapnia: Secondary | ICD-10-CM | POA: Diagnosis not present

## 2017-07-12 DIAGNOSIS — J690 Pneumonitis due to inhalation of food and vomit: Secondary | ICD-10-CM | POA: Diagnosis present

## 2017-07-12 DIAGNOSIS — Z79899 Other long term (current) drug therapy: Secondary | ICD-10-CM

## 2017-07-12 DIAGNOSIS — I2699 Other pulmonary embolism without acute cor pulmonale: Secondary | ICD-10-CM

## 2017-07-12 DIAGNOSIS — Z794 Long term (current) use of insulin: Secondary | ICD-10-CM

## 2017-07-12 DIAGNOSIS — G92 Toxic encephalopathy: Secondary | ICD-10-CM | POA: Diagnosis present

## 2017-07-12 DIAGNOSIS — Z992 Dependence on renal dialysis: Secondary | ICD-10-CM | POA: Diagnosis not present

## 2017-07-12 DIAGNOSIS — J90 Pleural effusion, not elsewhere classified: Secondary | ICD-10-CM | POA: Diagnosis present

## 2017-07-12 DIAGNOSIS — E875 Hyperkalemia: Secondary | ICD-10-CM | POA: Diagnosis present

## 2017-07-12 DIAGNOSIS — R6521 Severe sepsis with septic shock: Secondary | ICD-10-CM | POA: Diagnosis present

## 2017-07-12 DIAGNOSIS — R197 Diarrhea, unspecified: Secondary | ICD-10-CM | POA: Diagnosis not present

## 2017-07-12 DIAGNOSIS — A419 Sepsis, unspecified organism: Secondary | ICD-10-CM | POA: Diagnosis not present

## 2017-07-12 DIAGNOSIS — Y848 Other medical procedures as the cause of abnormal reaction of the patient, or of later complication, without mention of misadventure at the time of the procedure: Secondary | ICD-10-CM | POA: Diagnosis not present

## 2017-07-12 DIAGNOSIS — N19 Unspecified kidney failure: Secondary | ICD-10-CM | POA: Diagnosis not present

## 2017-07-12 DIAGNOSIS — I499 Cardiac arrhythmia, unspecified: Secondary | ICD-10-CM

## 2017-07-12 DIAGNOSIS — J9811 Atelectasis: Secondary | ICD-10-CM | POA: Diagnosis present

## 2017-07-12 DIAGNOSIS — R402222 Coma scale, best verbal response, incomprehensible words, at arrival to emergency department: Secondary | ICD-10-CM | POA: Diagnosis present

## 2017-07-12 DIAGNOSIS — I120 Hypertensive chronic kidney disease with stage 5 chronic kidney disease or end stage renal disease: Secondary | ICD-10-CM | POA: Diagnosis present

## 2017-07-12 DIAGNOSIS — I4901 Ventricular fibrillation: Secondary | ICD-10-CM | POA: Diagnosis not present

## 2017-07-12 DIAGNOSIS — T85898A Other specified complication of other internal prosthetic devices, implants and grafts, initial encounter: Secondary | ICD-10-CM | POA: Diagnosis not present

## 2017-07-12 DIAGNOSIS — F419 Anxiety disorder, unspecified: Secondary | ICD-10-CM | POA: Diagnosis present

## 2017-07-12 DIAGNOSIS — R140 Abdominal distension (gaseous): Secondary | ICD-10-CM

## 2017-07-12 DIAGNOSIS — E878 Other disorders of electrolyte and fluid balance, not elsewhere classified: Secondary | ICD-10-CM | POA: Diagnosis not present

## 2017-07-12 DIAGNOSIS — J9690 Respiratory failure, unspecified, unspecified whether with hypoxia or hypercapnia: Secondary | ICD-10-CM

## 2017-07-12 DIAGNOSIS — Z8249 Family history of ischemic heart disease and other diseases of the circulatory system: Secondary | ICD-10-CM

## 2017-07-12 DIAGNOSIS — Z880 Allergy status to penicillin: Secondary | ICD-10-CM

## 2017-07-12 LAB — BASIC METABOLIC PANEL
ANION GAP: 24 — AB (ref 5–15)
BUN: 105 mg/dL — ABNORMAL HIGH (ref 6–20)
CO2: 26 mmol/L (ref 22–32)
Calcium: 9 mg/dL (ref 8.9–10.3)
Chloride: 93 mmol/L — ABNORMAL LOW (ref 101–111)
Creatinine, Ser: 16.38 mg/dL — ABNORMAL HIGH (ref 0.61–1.24)
GFR calc Af Amer: 3 mL/min — ABNORMAL LOW (ref >60)
GFR calc non Af Amer: 2 mL/min — ABNORMAL LOW (ref >60)
GLUCOSE: 102 mg/dL — AB (ref 65–99)
POTASSIUM: 5.9 mmol/L — AB (ref 3.5–5.1)
Sodium: 143 mmol/L (ref 135–145)

## 2017-07-12 LAB — CBC WITH DIFFERENTIAL/PLATELET
BASOS PCT: 0 %
Basophils Absolute: 0 10*3/uL (ref 0.0–0.1)
EOS ABS: 0 10*3/uL (ref 0.0–0.7)
EOS PCT: 0 %
HEMATOCRIT: 36.7 % — AB (ref 39.0–52.0)
Hemoglobin: 11.8 g/dL — ABNORMAL LOW (ref 13.0–17.0)
LYMPHS PCT: 36 %
Lymphs Abs: 1.3 10*3/uL (ref 0.7–4.0)
MCH: 26.4 pg (ref 26.0–34.0)
MCHC: 32.2 g/dL (ref 30.0–36.0)
MCV: 82.1 fL (ref 78.0–100.0)
MONO ABS: 0.3 10*3/uL (ref 0.1–1.0)
Monocytes Relative: 8 %
NEUTROS PCT: 56 %
Neutro Abs: 2.1 10*3/uL (ref 1.7–7.7)
PLATELETS: 267 10*3/uL (ref 150–400)
RBC: 4.47 MIL/uL (ref 4.22–5.81)
RDW: 16.6 % — AB (ref 11.5–15.5)
WBC MORPHOLOGY: INCREASED
WBC: 3.7 10*3/uL — AB (ref 4.0–10.5)

## 2017-07-12 LAB — URINALYSIS, ROUTINE W REFLEX MICROSCOPIC
BACTERIA UA: NONE SEEN
BILIRUBIN URINE: NEGATIVE
Glucose, UA: 150 mg/dL — AB
Ketones, ur: NEGATIVE mg/dL
Leukocytes, UA: NEGATIVE
Nitrite: NEGATIVE
PH: 7 (ref 5.0–8.0)
Protein, ur: 100 mg/dL — AB
SPECIFIC GRAVITY, URINE: 1.011 (ref 1.005–1.030)

## 2017-07-12 LAB — CBG MONITORING, ED: GLUCOSE-CAPILLARY: 135 mg/dL — AB (ref 65–99)

## 2017-07-12 LAB — COMPREHENSIVE METABOLIC PANEL
ALBUMIN: 2.7 g/dL — AB (ref 3.5–5.0)
ALT: 15 U/L — AB (ref 17–63)
AST: 45 U/L — AB (ref 15–41)
Alkaline Phosphatase: 85 U/L (ref 38–126)
Anion gap: 24 — ABNORMAL HIGH (ref 5–15)
BUN: 102 mg/dL — AB (ref 6–20)
CHLORIDE: 93 mmol/L — AB (ref 101–111)
CO2: 20 mmol/L — ABNORMAL LOW (ref 22–32)
CREATININE: 16.45 mg/dL — AB (ref 0.61–1.24)
Calcium: 9.3 mg/dL (ref 8.9–10.3)
GFR calc Af Amer: 3 mL/min — ABNORMAL LOW (ref >60)
GFR, EST NON AFRICAN AMERICAN: 2 mL/min — AB (ref >60)
GLUCOSE: 114 mg/dL — AB (ref 65–99)
POTASSIUM: 7.5 mmol/L — AB (ref 3.5–5.1)
Sodium: 137 mmol/L (ref 135–145)
Total Bilirubin: 0.7 mg/dL (ref 0.3–1.2)
Total Protein: 8.8 g/dL — ABNORMAL HIGH (ref 6.5–8.1)

## 2017-07-12 LAB — I-STAT CHEM 8, ED
BUN: 91 mg/dL — ABNORMAL HIGH (ref 6–20)
CHLORIDE: 101 mmol/L (ref 101–111)
CREATININE: 15.7 mg/dL — AB (ref 0.61–1.24)
Calcium, Ion: 0.97 mmol/L — ABNORMAL LOW (ref 1.15–1.40)
Glucose, Bld: 114 mg/dL — ABNORMAL HIGH (ref 65–99)
HCT: 34 % — ABNORMAL LOW (ref 39.0–52.0)
Hemoglobin: 11.6 g/dL — ABNORMAL LOW (ref 13.0–17.0)
Potassium: 7.4 mmol/L (ref 3.5–5.1)
SODIUM: 134 mmol/L — AB (ref 135–145)
TCO2: 23 mmol/L (ref 22–32)

## 2017-07-12 LAB — I-STAT ARTERIAL BLOOD GAS, ED
Acid-base deficit: 2 mmol/L (ref 0.0–2.0)
Bicarbonate: 22.7 mmol/L (ref 20.0–28.0)
O2 Saturation: 99 %
PH ART: 7.366 (ref 7.350–7.450)
PO2 ART: 152 mmHg — AB (ref 83.0–108.0)
TCO2: 24 mmol/L (ref 22–32)
pCO2 arterial: 39.6 mmHg (ref 32.0–48.0)

## 2017-07-12 LAB — CK: Total CK: 3519 U/L — ABNORMAL HIGH (ref 49–397)

## 2017-07-12 LAB — I-STAT TROPONIN, ED: TROPONIN I, POC: 0.1 ng/mL — AB (ref 0.00–0.08)

## 2017-07-12 LAB — ACETAMINOPHEN LEVEL

## 2017-07-12 LAB — RAPID URINE DRUG SCREEN, HOSP PERFORMED
AMPHETAMINES: NOT DETECTED
BENZODIAZEPINES: NOT DETECTED
COCAINE: NOT DETECTED
OPIATES: POSITIVE — AB
Tetrahydrocannabinol: NOT DETECTED

## 2017-07-12 LAB — LIPASE, BLOOD: Lipase: 26 U/L (ref 11–51)

## 2017-07-12 LAB — TYPE AND SCREEN
ABO/RH(D): A POS
ANTIBODY SCREEN: NEGATIVE

## 2017-07-12 LAB — SALICYLATE LEVEL: Salicylate Lvl: <7 mg/dL (ref 2.8–30.0)

## 2017-07-12 LAB — I-STAT CG4 LACTIC ACID, ED: LACTIC ACID, VENOUS: 3.21 mmol/L — AB (ref 0.5–1.9)

## 2017-07-12 LAB — GLUCOSE, CAPILLARY
Glucose-Capillary: 107 mg/dL — ABNORMAL HIGH (ref 65–99)
Glucose-Capillary: 99 mg/dL (ref 65–99)

## 2017-07-12 LAB — LACTIC ACID, PLASMA: Lactic Acid, Venous: 2.1 mmol/L (ref 0.5–1.9)

## 2017-07-12 LAB — POC OCCULT BLOOD, ED: Fecal Occult Bld: NEGATIVE

## 2017-07-12 LAB — PROCALCITONIN: PROCALCITONIN: 46.63 ng/mL

## 2017-07-12 LAB — PROTIME-INR
INR: 1.28
Prothrombin Time: 15.8 seconds — ABNORMAL HIGH (ref 11.4–15.2)

## 2017-07-12 MED ORDER — FERROUS SULFATE 325 (65 FE) MG PO TABS: 325.0000 mg | ORAL_TABLET | Freq: Three times a day (TID) | ORAL | Status: DC

## 2017-07-12 MED ORDER — VANCOMYCIN HCL 10 G IV SOLR
1750.0000 mg | Freq: Once | INTRAVENOUS | Status: AC
  Administered 2017-07-12: 1750 mg via INTRAVENOUS
  Filled 2017-07-12 (×2): qty 1750

## 2017-07-12 MED ORDER — ENOXAPARIN SODIUM 30 MG/0.3ML ~~LOC~~ SOLN
30.0000 mg | SUBCUTANEOUS | Status: DC
  Administered 2017-07-12 – 2017-07-13 (×2): 30 mg via SUBCUTANEOUS
  Filled 2017-07-12 (×2): qty 0.3

## 2017-07-12 MED ORDER — SODIUM CHLORIDE 0.9 % IV SOLN
1.0000 g | Freq: Once | INTRAVENOUS | Status: DC
  Filled 2017-07-12: qty 10

## 2017-07-12 MED ORDER — POTASSIUM CHLORIDE 20 MEQ/15ML (10%) PO SOLN: 30.0000 meq | ORAL | Status: DC

## 2017-07-12 MED ORDER — DEXTROSE 50 % IV SOLN
1.0000 | Freq: Once | INTRAVENOUS | Status: AC
  Administered 2017-07-12: 50 mL via INTRAVENOUS
  Filled 2017-07-12: qty 50

## 2017-07-12 MED ORDER — FENTANYL CITRATE (PF) 100 MCG/2ML IJ SOLN
50.0000 ug | Freq: Once | INTRAMUSCULAR | Status: AC
  Administered 2017-07-12: 50 ug via INTRAVENOUS

## 2017-07-12 MED ORDER — PHENYLEPHRINE HCL-NACL 10-0.9 MG/250ML-% IV SOLN
0.0000 ug/min | INTRAVENOUS | Status: DC
  Filled 2017-07-12: qty 250

## 2017-07-12 MED ORDER — FENTANYL BOLUS VIA INFUSION
25.0000 ug | INTRAVENOUS | Status: DC | PRN
  Filled 2017-07-12: qty 25

## 2017-07-12 MED ORDER — CALCIUM GLUCONATE 10 % IV SOLN
1.0000 g | Freq: Once | INTRAVENOUS | Status: AC
  Administered 2017-07-12: 1 g via INTRAVENOUS
  Filled 2017-07-12: qty 10

## 2017-07-12 MED ORDER — POTASSIUM CHLORIDE 20 MEQ/15ML (10%) PO SOLN: 40.0000 meq | ORAL | Status: DC

## 2017-07-12 MED ORDER — SODIUM BICARBONATE 8.4 % IV SOLN
INTRAVENOUS | Status: AC
  Administered 2017-07-12: 50 meq via INTRAVENOUS
  Filled 2017-07-12: qty 50

## 2017-07-12 MED ORDER — POTASSIUM CHLORIDE 20 MEQ/15ML (10%) PO SOLN: 20.0000 meq | ORAL | Status: DC

## 2017-07-12 MED ORDER — SODIUM BICARBONATE 8.4 % IV SOLN
50.0000 meq | Freq: Once | INTRAVENOUS | Status: AC
  Administered 2017-07-12 (×2): 50 meq via INTRAVENOUS
  Filled 2017-07-12: qty 50

## 2017-07-12 MED ORDER — HYDRALAZINE HCL 50 MG PO TABS: 50.0000 mg | ORAL_TABLET | Freq: Every day | ORAL | Status: DC

## 2017-07-12 MED ORDER — CHLORHEXIDINE GLUCONATE CLOTH 2 % EX PADS: 6.0000 | MEDICATED_PAD | Freq: Every day | CUTANEOUS | Status: DC

## 2017-07-12 MED ORDER — MIDAZOLAM HCL 2 MG/2ML IJ SOLN: 1.0000 mg | INTRAMUSCULAR | Status: DC | PRN

## 2017-07-12 MED ORDER — SODIUM CHLORIDE 0.9 % IV SOLN
250.0000 mL | INTRAVENOUS | Status: DC | PRN
  Administered 2017-07-22: 250 mL via INTRAVENOUS

## 2017-07-12 MED ORDER — ORAL CARE MOUTH RINSE
15.0000 mL | OROMUCOSAL | Status: DC
  Administered 2017-07-13 – 2017-07-22 (×91): 15 mL via OROMUCOSAL

## 2017-07-12 MED ORDER — NOREPINEPHRINE 4 MG/250ML-% IV SOLN
0.0000 ug/min | INTRAVENOUS | Status: DC
  Administered 2017-07-12 – 2017-07-13 (×2): 10 ug/min via INTRAVENOUS
  Administered 2017-07-13 (×2): 16 ug/min via INTRAVENOUS
  Filled 2017-07-12 (×4): qty 250

## 2017-07-12 MED ORDER — SODIUM BICARBONATE 8.4 % IV SOLN: 50.0000 meq | Freq: Once | INTRAVENOUS | Status: AC

## 2017-07-12 MED ORDER — TERAZOSIN HCL 5 MG PO CAPS: 5.0000 mg | ORAL_CAPSULE | Freq: Every day | ORAL | Status: DC

## 2017-07-12 MED ORDER — SODIUM PHOSPHATES 45 MMOLE/15ML IV SOLN: 30.0000 mmol | Freq: Once | INTRAVENOUS | Status: DC

## 2017-07-12 MED ORDER — SODIUM CHLORIDE 0.9 % IV BOLUS
1000.0000 mL | Freq: Once | INTRAVENOUS | Status: AC
Start: 2017-07-12 — End: 2017-07-12
  Administered 2017-07-12: 1000 mL via INTRAVENOUS

## 2017-07-12 MED ORDER — FAMOTIDINE IN NACL 20-0.9 MG/50ML-% IV SOLN
20.0000 mg | INTRAVENOUS | Status: DC
  Administered 2017-07-13 – 2017-07-21 (×10): 20 mg via INTRAVENOUS
  Filled 2017-07-12 (×11): qty 50

## 2017-07-12 MED ORDER — AMLODIPINE BESYLATE 10 MG PO TABS: 10.0000 mg | ORAL_TABLET | Freq: Every day | ORAL | Status: DC

## 2017-07-12 MED ORDER — INSULIN ASPART 100 UNIT/ML ~~LOC~~ SOLN
0.0000 [IU] | SUBCUTANEOUS | Status: DC
  Administered 2017-07-13: 2 [IU] via SUBCUTANEOUS
  Administered 2017-07-14 (×3): 1 [IU] via SUBCUTANEOUS
  Administered 2017-07-15 (×2): 2 [IU] via SUBCUTANEOUS
  Administered 2017-07-15 – 2017-07-16 (×2): 1 [IU] via SUBCUTANEOUS
  Administered 2017-07-16: 2 [IU] via SUBCUTANEOUS
  Administered 2017-07-16: 5 [IU] via SUBCUTANEOUS
  Administered 2017-07-16: 2 [IU] via SUBCUTANEOUS
  Administered 2017-07-17 (×2): 5 [IU] via SUBCUTANEOUS

## 2017-07-12 MED ORDER — DEXTROSE 5 % IV SOLN: 20.0000 mmol | Freq: Once | INTRAVENOUS | Status: DC

## 2017-07-12 MED ORDER — ROCURONIUM BROMIDE 50 MG/5ML IV SOLN
INTRAVENOUS | Status: AC | PRN
  Administered 2017-07-12: 7 mg via INTRAVENOUS

## 2017-07-12 MED ORDER — SODIUM CHLORIDE 0.9 % IV SOLN: 1.0000 g | Freq: Once | INTRAVENOUS | Status: DC

## 2017-07-12 MED ORDER — PIPERACILLIN-TAZOBACTAM 3.375 G IVPB 30 MIN
3.3750 g | Freq: Two times a day (BID) | INTRAVENOUS | Status: DC
  Administered 2017-07-13 (×2): 3.375 g via INTRAVENOUS
  Filled 2017-07-12 (×3): qty 50

## 2017-07-12 MED ORDER — SODIUM CHLORIDE 0.9 % IV SOLN: 6.0000 g | Freq: Once | INTRAVENOUS | Status: DC

## 2017-07-12 MED ORDER — CHLORHEXIDINE GLUCONATE 0.12% ORAL RINSE (MEDLINE KIT)
15.0000 mL | Freq: Two times a day (BID) | OROMUCOSAL | Status: DC
  Administered 2017-07-12 – 2017-07-22 (×20): 15 mL via OROMUCOSAL

## 2017-07-12 MED ORDER — CLONIDINE HCL 0.1 MG PO TABS
0.1000 mg | ORAL_TABLET | Freq: Two times a day (BID) | ORAL | Status: DC
  Filled 2017-07-12: qty 1

## 2017-07-12 MED ORDER — SODIUM PHOSPHATES 45 MMOLE/15ML IV SOLN: 10.0000 mmol | Freq: Once | INTRAVENOUS | Status: DC

## 2017-07-12 MED ORDER — INSULIN ASPART 100 UNIT/ML IV SOLN
10.0000 [IU] | Freq: Once | INTRAVENOUS | Status: AC
  Administered 2017-07-12: 10 [IU] via INTRAVENOUS
  Filled 2017-07-12: qty 0.1

## 2017-07-12 MED ORDER — MAGNESIUM SULFATE 2 GM/50ML IV SOLN: 2.0000 g | Freq: Once | INTRAVENOUS | Status: DC

## 2017-07-12 MED ORDER — FENTANYL 2500MCG IN NS 250ML (10MCG/ML) PREMIX INFUSION
25.0000 ug/h | INTRAVENOUS | Status: DC
  Administered 2017-07-12: 50 ug/h via INTRAVENOUS
  Filled 2017-07-12: qty 250

## 2017-07-12 MED ORDER — DOCUSATE SODIUM 50 MG/5ML PO LIQD: 100.0000 mg | Freq: Two times a day (BID) | ORAL | Status: DC | PRN

## 2017-07-12 MED ORDER — INSULIN ASPART 100 UNIT/ML ~~LOC~~ SOLN
SUBCUTANEOUS | Status: AC
  Filled 2017-07-12: qty 1

## 2017-07-12 MED ORDER — FENTANYL 2500MCG IN NS 250ML (10MCG/ML) PREMIX INFUSION
25.0000 ug/h | INTRAVENOUS | Status: DC
  Administered 2017-07-12: 50 ug/h via INTRAVENOUS

## 2017-07-12 MED ORDER — GABAPENTIN 400 MG PO CAPS: 400.0000 mg | ORAL_CAPSULE | Freq: Every day | ORAL | Status: DC

## 2017-07-12 MED ORDER — ETOMIDATE 2 MG/ML IV SOLN
INTRAVENOUS | Status: AC | PRN
  Administered 2017-07-12: 20 mg via INTRAVENOUS

## 2017-07-12 MED ORDER — ALBUMIN HUMAN 25 % IV SOLN
25.0000 g | INTRAVENOUS | Status: DC | PRN
  Administered 2017-07-17: 25 g via INTRAVENOUS
  Filled 2017-07-12: qty 100

## 2017-07-12 NOTE — Progress Notes (Signed)
Pharmacy Antibiotic Note  [name] LemonsSylvester Dudley is a 72 y.o. male admitted on 01-02-2018 with sepsis.  Pharmacy originally consulted for Vancomycin dosing now to add Zosyn due to concern for aspiration PNA.  Noted PCN allergy listed as N/V, rash but listed as low-grade. Discussed with CCM and will trial Zosyn and monitor for rash. ESRD  Plan: - Start Zosyn 3.375g IV every 12 hours (infused over 4 hours) - Will follow-up with HD plans for additional Vancomycin doses - Will continue to follow renal function, culture results, LOT, and antibiotic de-escalation plans   Height: 5\' 7"  (170.2 cm) Weight: 186 lb 4.6 oz (84.5 kg) IBW/kg (Calculated) : 66.1  Temp (24hrs), Avg:99.7 F (37.6 C), Min:99.7 F (37.6 C), Max:99.7 F (37.6 C)  Recent Labs  Lab August 27, 2017 1726 August 27, 2017 1729  WBC 3.7*  --   CREATININE 16.45* 15.70*  LATICACIDVEN  --  3.21*    Estimated Creatinine Clearance: 4.4 mL/min (A) (by C-G formula based on SCr of 15.7 mg/dL (H)).    Allergies  Allergen Reactions  . Penicillins Nausea And Vomiting and Rash    Has patient had a PCN reaction causing immediate rash, facial/tongue/throat swelling, SOB or lightheadedness with hypotension: No Has patient had a PCN reaction causing severe rash involving mucus membranes or skin necrosis: No Has patient had a PCN reaction that required hospitalization: No Has patient had a PCN reaction occurring within the last 10 years: No If all of the above answers are "NO", then may proceed with Cephalosporin use.     Antimicrobials this admission: Vanc 6/15>> Zosyn 6/15 >>  Dose adjustments this admission: N/A  Microbiology results: 6/15 BCx >> 6/15 RCx >> 6/15 MRSA PCR >>  Thank you for allowing pharmacy to be a part of this patient's care.  Georgina PillionElizabeth Zaccheus Edmister, PharmD, BCPS Clinical Pharmacist Pager: [phone] 01-02-2018 9:43 PM

## 2017-07-12 NOTE — H&P (Signed)
PULMONARY / CRITICAL CARE MEDICINE   Name: [name] Dudley MRN: [id number] DOB: 10/06/1945    ADMISSION DATE:  07/04/2017 CONSULTATION DATE:  07/21/2017  REFERRING MD:  Meridee Score, MD  CHIEF COMPLAINT:  Unresponsive, found down  HISTORY OF PRESENT ILLNESS:   72 year old male history of end-stage renal disease on hemodialysis via right PermCath, insulin-dependent diabetes mellitus who was found down on a wellness check.  No family or friends are present at the time of my examination, however the patient was reportedly last seen normal on Wednesday and per report was not complaining of any sick symptoms at that time.  Police found him unresponsive with vomitus throughout his home and when EMS arrived he was noted to have pinpoint pupils.  There is also report of an empty pill bottle that actually belonged to his friend who normally gives him rides to dialysis, they stated that the name started with hydro-and they believed it to be a narcotic.  Patient was bag mask ventilated in route to the emergency department.  On arrival he was intubated for airway protection.  He was noted to be hyperkalemic for which she received insulin, dextrose, calcium.  PAST MEDICAL HISTORY :  He  has a past medical history of Diabetes mellitus without complication (HCC), Hypertension, Neuropathy, and Renal disorder.  PAST SURGICAL HISTORY: He  has a past surgical history that includes Insertion of dialysis catheter (Right, 12/15/2016); AV fistula placement (Left, 12/20/2016); Colonoscopy; AV fistula placement (Left, 04/07/2017); Ligation of arteriovenous  fistula (Left, 04/07/2017); A/V Fistulagram (N/A, 06/13/2017); and PERIPHERAL VASCULAR BALLOON ANGIOPLASTY (06/13/2017).  Allergies  Allergen Reactions  . Penicillins Nausea And Vomiting and Rash    Has patient had a PCN reaction causing immediate rash, facial/tongue/throat swelling, SOB or lightheadedness with hypotension: No Has patient had a PCN reaction causing  severe rash involving mucus membranes or skin necrosis: No Has patient had a PCN reaction that required hospitalization: No Has patient had a PCN reaction occurring within the last 10 years: No If all of the above answers are "NO", then may proceed with Cephalosporin use.     No current facility-administered medications on file prior to encounter.    Current Outpatient Medications on File Prior to Encounter  Medication Sig  . amLODipine (NORVASC) 10 MG tablet Take 10 mg daily by mouth.  . Cholecalciferol (VITAMIN D-3) 1000 units CAPS Take 1,000 Units daily by mouth.  . cloNIDine (CATAPRES) 0.1 MG tablet Take 0.1 mg 2 (two) times daily by mouth.  . ferric citrate (AURYXIA) 1 GM 210 MG(Fe) tablet Take 630 mg by mouth 3 (three) times daily with meals.  . ferrous sulfate 325 (65 FE) MG tablet Take 325 mg 3 (three) times daily with meals by mouth.  . gabapentin (NEURONTIN) 400 MG capsule Take 400 mg at bedtime by mouth.  Marland Kitchen guaiFENesin-codeine (ROBITUSSIN AC) 100-10 MG/5ML syrup Take by mouth.  . hydrALAZINE (APRESOLINE) 50 MG tablet Take 50 mg by mouth daily.  . Hypromellose (ARTIFICIAL TEARS OP) Apply 1 drop to eye daily as needed (dry eyes).  . insulin glargine (LANTUS) 100 UNIT/ML injection Inject 4 Units at bedtime into the skin.   Marland Kitchen oxyCODONE (OXY IR/ROXICODONE) 5 MG immediate release tablet Take 5 mg by mouth every 4 (four) hours as needed for severe pain.  . sildenafil (VIAGRA) 100 MG tablet Take 100 mg daily as needed by mouth for erectile dysfunction.  . sodium bicarbonate 650 MG tablet Take 650 mg every 8 (eight) hours by mouth.  . terazosin (  HYTRIN) 5 MG capsule Take 5 mg at bedtime by mouth.    FAMILY HISTORY:  His indicated that the status of his mother is unknown. He indicated that the status of his father is unknown.   SOCIAL HISTORY: He  reports that he has quit smoking. His smoking use included cigarettes. He has never used smokeless tobacco. He reports that he does not  drink alcohol or use drugs.  REVIEW OF SYSTEMS:   Unable to obtain   VITAL SIGNS: BP 119/63   Pulse (!) 114   Resp 19   SpO2 100%   HEMODYNAMICS:    VENTILATOR SETTINGS: Vent Mode: PRVC FiO2 (%):  [100 %] 100 % Set Rate:  [18 bmp] 18 bmp Vt Set:  [530 mL] 530 mL PEEP:  [5 cmH20] 5 cmH20 Plateau Pressure:  [22 cmH20] 22 cmH20  INTAKE / OUTPUT: No intake/output data recorded.  PHYSICAL EXAMINATION: Physical Exam  Constitutional:  Chronically ill-appearing, intubated, lying motionless in stretcher with feces on clothing.    HENT:  Head: Normocephalic and atraumatic.  Eyes: Right eye exhibits no discharge. Left eye exhibits no discharge.  Cardiovascular: Normal rate.  No murmur heard. Right upper extremity permacath in place, dressing dirt stained though insertion site and surrounding skin clean and dry.  Left upper extremity fistula  Pulmonary/Chest: Effort normal and breath sounds normal. No stridor. No respiratory distress.  Abdominal: Soft. Bowel sounds are normal. He exhibits no distension. There is no tenderness.  Musculoskeletal: He exhibits no edema or deformity.  Neurological:  Sedated on fentanyl.  Not withdrawing to pain.  Skin: Skin is warm and dry. No rash noted.    LABS:  BMET Recent Labs  Lab 07/18/2017 1726 07-18-17 1729  NA 137 134*  K 7.5* 7.4*  CL 93* 101  CO2 20*  --   BUN 102* 91*  CREATININE 16.45* 15.70*  GLUCOSE 114* 114*    Electrolytes Recent Labs  Lab 18-Jul-2017 1726  CALCIUM 9.3    CBC Recent Labs  Lab 2017-07-18 1726 Jul 18, 2017 1729  WBC 3.7*  --   HGB 11.8* 11.6*  HCT 36.7* 34.0*  PLT 267  --     Coag's Recent Labs  Lab 18-Jul-2017 1726  INR 1.28    Sepsis Markers Recent Labs  Lab July 18, 2017 1729  LATICACIDVEN 3.21*    ABG Recent Labs  Lab 07/18/2017 1812  PHART 7.366  PCO2ART 39.6  PO2ART 152.0*    Liver Enzymes Recent Labs  Lab 18-Jul-2017 1726  AST 45*  ALT 15*  ALKPHOS 85  BILITOT 0.7  ALBUMIN  2.7*    Cardiac Enzymes No results for input(s): TROPONINI, PROBNP in the last 168 hours.  Glucose Recent Labs  Lab 18-Jul-2017 1715  GLUCAP 135*    Imaging Dg Chest Port 1 View  Result Date: Jul 18, 2017 CLINICAL DATA:  Intubation.  Respiratory arrest. EXAM: PORTABLE CHEST 1 VIEW COMPARISON:  12/05/2016 FINDINGS: Endotracheal tube terminates 3.4 cm above carina.Nasogastric tube extends beyond the inferior aspect of the film. Right-sided dialysis catheter tip at high right atrium. Midline trachea. Normal heart size for level of inspiration. Atherosclerosis in the transverse aorta. No right and no definite left pleural effusion. Improved mild pulmonary venous congestion, as evidenced by perihilar interstitial prominence. Mild left base airspace disease is similar. IMPRESSION: Mild pulmonary venous congestion, improved since the prior. Left base airspace disease is unchanged and favored to represent atelectasis. Appropriate position of endotracheal tube. Aortic Atherosclerosis (ICD10-I70.0). Electronically Signed   By: Hosie Spangle.D.  On: 06/28/2017 17:38     STUDIES:  07/20/2017 CTH >> 07/24/2017 CXR: interstitial edema, decreased from prior   CULTURES: 07/02/2017 blood cultures>> 07/07/2017 tracheal aspirate>>  ANTIBIOTICS: Vancomycin 6/15>>  SIGNIFICANT EVENTS: 6/15 intubated for airway protection  LINES/TUBES: ETT 6/15>>   DISCUSSION:   ASSESSMENT / PLAN: 72 year old male history of end-stage renal disease on hemodialysis via right PermCath, insulin-dependent diabetes mellitus who was found down on a wellness check with vomitus in room and empty narcotic bottle belonging to friend.    PULMONARY A: Intubated for airway protection  P:   -Low tidal volume ventilation -Post intubation blood gas acceptable ventilation -Daily sedation wean and spontaneous breathing trial  CARDIOVASCULAR A:  Irregular rhtyhm - appears to be atrial fibrillation or type II mobitz. Regardless of  exact rhythm I suspect this is due to his hyperkalemia H/o HTN  P:  -Hemodynamically stable here, will reinitiate his home antihypertensive regimen - follow up on rhythm after correction of hyperkalemia and consult Cardiology if still with high grade block.  No anticoagulation for now, consider if in persistent atrial fibrillation  RENAL A:   ESRD Hyperkalemia  P:   -Received temporization in the emergency department for hyperkalemia, will monitor electrolytes with hemodialysis -Spoke with his nephrologist who is arranging for dialysis now via his right PermCath -sodium bicarb on home med list however not acidotic on our blood gas and med list is unverified, will hold on this for now  GASTROINTESTINAL A:   Reported vomiting  P:   - LFT minimally elevated.  Will check a lipase and consider additional imaging if he continues to have vomiting or complaints of abdominal pain once more awake - if having diarrhea will check for GI pathogen panel and C dificile PCR  HEMATOLOGIC A:   No active issues  P: - monitor CBC, transfuse per regular protocol  INFECTIOUS A:   Leukopenia, encephalopathy  P:   -Given that we have such a limited history and no exact explanation for his metabolic encephalopathy we will give a single dose of vancomycin especially as he receives dialysis via a permacath.  Follow up on blood cultures and discontinue if negative and clinically improving  ENDOCRINE A:   Diabetes mellitus   P:   -Hold home Lantus and start sliding scale insulin while n.p.o.  NEUROLOGIC A:   Metabolic encephalopathy  P:   RASS goal: 0 Unclear cause of presentation, I spoke with his nephrologist who is going to check and see if he has been compliant with dialysis lately, his hyperkalemia/uremia suggest that he has not been.  Also with narcotic bottle with possible response to Narcan though was still obtunded on arrival to the emergency department suggesting another process.   Treating infection empirically as above.  Acetaminophen and aspirin levels negative.  First blood glucose here within normal limits and no report of hypoglycemia in the field.  No reports of focal neuro deficits though will need to reexamine once sedation has been weaned   FAMILY  - Updates:   - Inter-disciplinary family meet or Palliative Care meeting due by:  day 7   Italyhad Latajah Thuman, MD Pulmonary and Critical Care Medicine [facility] Pager: [phone]  07/01/2017, 6:50 PM

## 2017-07-12 NOTE — Progress Notes (Signed)
HD tx initiated via HD cath w/ cath function issues. AP: barely had blood return/pull, push/flush ok, VP: pull/push/flush ok, lines reversed. Dr. Marisue HumbleSanford was called to make aware that bp was 81/43 as I was setting up machines, he came to bedside and changed orders verbally to BFR of 300, no UF/keep even, and 1K+ bath now for 2 hrs then 2K+ bath for remainder of tx, may give NS bolus of 250ml x2 and Albumin 25% 25 G x3 during HD tx to keep MAP>65. Was unable to modify the actual HD order set as I accidentally completed it on the worklist prior to making the changes.VSS but w/ bp still low and MAP w/in ordered parameters, will cont to monitor while on HD tx

## 2017-07-12 NOTE — Progress Notes (Addendum)
CRITICAL VALUE ALERT  Critical Value:  Lactic Acid 2.1  Date & Time Notied:  12/20/17; 2250  Provider Notified: Jovita KussmaulKatalina Eubanks, NP  Orders Received/Actions taken: no new orders- lactic acid trending down.

## 2017-07-12 NOTE — Progress Notes (Signed)
Pharmacy Antibiotic Note  [name] Dudley is a 72 y.o. male admitted on 2017/09/08 with sepsis.  Pharmacy has been consulted for vancomycin dosing. WBC is low at 3.7 and lactic acid is elevated. Pt with history of ESRD on HD. Planning HD today.   Plan: Vancomycin 1750mg  IV x 1 F/u HD plans for further doses Consider adding gram negative coverage     No data recorded.  Recent Labs  Lab Dec 04, 2017 1726 Dec 04, 2017 1729  WBC 3.7*  --   CREATININE 16.45* 15.70*  LATICACIDVEN  --  3.21*    Estimated Creatinine Clearance: 4.4 mL/min (A) (by C-G formula based on SCr of 15.7 mg/dL (H)).    Allergies  Allergen Reactions  . Penicillins Nausea And Vomiting and Rash    Has patient had a PCN reaction causing immediate rash, facial/tongue/throat swelling, SOB or lightheadedness with hypotension: No Has patient had a PCN reaction causing severe rash involving mucus membranes or skin necrosis: No Has patient had a PCN reaction that required hospitalization: No Has patient had a PCN reaction occurring within the last 10 years: No If all of the above answers are "NO", then may proceed with Cephalosporin use.     Antimicrobials this admission: Vanc 6/15>>  Dose adjustments this admission: N/A  Microbiology results: Pending  Thank you for allowing pharmacy to be a part of this patient's care.  Jasier Calabretta, Drake LeachRachel Lynn 2017/09/08 6:37 PM

## 2017-07-12 NOTE — ED Triage Notes (Signed)
Pt brought in by [facility] from home for respiratory arrest, LNW x4 days ago. Pt found by friend who states pt missed last treatment. Per EMS pupils pinpoint, given 2mg  narcan by fire, pt aspirated prior to arrival. Per EMS possible GI bleed. Pt unresponsive on arrival. Dark emesis found around patient on scene.

## 2017-07-12 NOTE — ED Provider Notes (Signed)
[facility] EMERGENCY DEPARTMENT Provider Note   CSN: [id number] Arrival date & time: 08-06-17  1637     History   Chief Complaint Chief Complaint  Patient presents with  . Altered Mental Status  . Respiratory Arrest    HPI [name] Dudley is a 72 y.o. male. Level 5  Caveat secondary to altered mental status and emergent condition.  Patient was brought in by EMS after a well person check found the patient unresponsive in his house.  He was being bagged by fire department when EMS got there finding him with pinpoint pupils.  He received Narcan with some improvement in his respiratory rate but not his mental status.  There was vomitus around the patient at the scene.  Patient is dialysis dependent and was due for dialysis today and did not go.  There is another report that there was an empty pill bottle of narcotic meds that was not in the patient's name at the house.  The history is provided by the EMS personnel.  Altered Mental Status   This is a new problem. Episode onset: unknown. His past medical history is significant for diabetes.    Past Medical History:  Diagnosis Date  . Diabetes mellitus without complication (HCC)   . Hypertension   . Neuropathy   . Renal disorder     Patient Active Problem List   Diagnosis Date Noted  . BPH (benign prostatic hyperplasia) 12/14/2016  . Type II diabetes mellitus with renal manifestations (HCC) 12/14/2016  . Anemia due to end stage renal disease (HCC) 12/13/2016  . Hypocalcemia 12/13/2016  . Hypertension   . ESRD (end stage renal disease) [facility])     Past Surgical History:  Procedure Laterality Date  . A/V FISTULAGRAM N/A 06/13/2017   Procedure: A/V FISTULAGRAM - Left AV;  Surgeon: Sherren Kerns, MD;  Location: [facility] INVASIVE CV LAB;  Service: Cardiovascular;  Laterality: N/A;  . AV FISTULA PLACEMENT Left 12/20/2016   Procedure: ARTERIOVENOUS (AV) FISTULA CREATION;  Surgeon: Chuck Hint, MD;  Location:  [facility] OR;  Service: Vascular;  Laterality: Left;  . AV FISTULA PLACEMENT Left 04/07/2017   Procedure: ARTERIOVENOUS (AV) BRACHIOCEPHALIC FISTULA CREATION;  Surgeon: Chuck Hint, MD;  Location: [facility] OR;  Service: Vascular;  Laterality: Left;  . COLONOSCOPY    . INSERTION OF DIALYSIS CATHETER Right 12/15/2016   Procedure: INSERTION OF TUNNEL DIALYSIS CATHETER - Right Internal Jugular;  Surgeon: Fransisco Hertz, MD;  Location: [facility] OR;  Service: Vascular;  Laterality: Right;  . LIGATION OF ARTERIOVENOUS  FISTULA Left 04/07/2017   Procedure: LIGATION OF ARTERIOVENOUS  FISTULA RADIOCEPHALIC;  Surgeon: Chuck Hint, MD;  Location: [facility] OR;  Service: Vascular;  Laterality: Left;  . PERIPHERAL VASCULAR BALLOON ANGIOPLASTY  06/13/2017   Procedure: PERIPHERAL VASCULAR BALLOON ANGIOPLASTY;  Surgeon: Sherren Kerns, MD;  Location: [facility] INVASIVE CV LAB;  Service: Cardiovascular;;  left AV fistula        Home Medications    Prior to Admission medications   Medication Sig Start Date End Date Taking? Authorizing Provider  amLODipine (NORVASC) 10 MG tablet Take 10 mg daily by mouth.    [provider]  Cholecalciferol (VITAMIN D-3) 1000 units CAPS Take 1,000 Units daily by mouth.    [provider]  cloNIDine (CATAPRES) 0.1 MG tablet Take 0.1 mg 2 (two) times daily by mouth.    [provider]  ferric citrate (AURYXIA) 1 GM 210 MG(Fe) tablet Take 630 mg by mouth 3 (  three) times daily with meals.    [provider]  ferrous sulfate 325 (65 FE) MG tablet Take 325 mg 3 (three) times daily with meals by mouth.    [provider]  gabapentin (NEURONTIN) 400 MG capsule Take 400 mg at bedtime by mouth.    [provider]  hydrALAZINE (APRESOLINE) 50 MG tablet Take 50 mg by mouth daily.    [provider]  Hypromellose (ARTIFICIAL TEARS OP) Apply 1 drop to eye daily as needed (dry eyes).    [provider]  insulin glargine (LANTUS)  100 UNIT/ML injection Inject 4 Units at bedtime into the skin.     [provider]  sildenafil (VIAGRA) 100 MG tablet Take 100 mg daily as needed by mouth for erectile dysfunction.    [provider]  sodium bicarbonate 650 MG tablet Take 650 mg every 8 (eight) hours by mouth.    [provider]  terazosin (HYTRIN) 5 MG capsule Take 5 mg at bedtime by mouth.    [provider]    Family History Family History  Problem Relation Age of Onset  . Cancer Mother        Patient does not know which type of cancer  . Hypertension Father     Social History Social History   Tobacco Use  . Smoking status: Former Smoker    Types: Cigarettes  . Smokeless tobacco: Never Used  . Tobacco comment: stopped smoking Mar 2019  Substance Use Topics  . Alcohol use: No    Frequency: Never  . Drug use: No     Allergies   Penicillins   Review of Systems Review of Systems  Unable to perform ROS: Intubated     Physical Exam Updated Vital Signs There were no vitals taken for this visit.  Physical Exam  Constitutional: He appears well-developed and well-nourished.  HENT:  Head: Normocephalic and atraumatic.  Right Ear: External ear normal.  Left Ear: External ear normal.  There is secretions and vomitus around the face and eyes and in the mouth.  Eyes: Pupils are equal, round, and reactive to light. Conjunctivae are normal.  Neck: No tracheal deviation present.  Cardiovascular: Normal rate, regular rhythm, normal heart sounds and intact distal pulses.  Pulmonary/Chest:  Patient with some spontaneous breathing activity but also being assisted by bag valve mask.  He is got coarse breath sounds throughout.  Abdominal: Soft. He exhibits no mass. There is no tenderness. There is no guarding.  Musculoskeletal: Normal range of motion. He exhibits no deformity.  There is a fistula in his left upper arm with a positive thrill.  He is got trace edema in his lower  extremities.  No deformity.  Neurological: He is unresponsive. GCS eye subscore is 1. GCS verbal subscore is 2. GCS motor subscore is 2.  Skin: Skin is warm and dry. Capillary refill takes less than 2 seconds.     ED Treatments / Results  Labs (all labs ordered are listed, but only abnormal results are displayed) Labs Reviewed  CBC WITH DIFFERENTIAL/PLATELET - Abnormal; Notable for the following components:      Result Value   WBC 3.7 (*)    Hemoglobin 11.8 (*)    HCT 36.7 (*)    RDW 16.6 (*)    All other components within normal limits  COMPREHENSIVE METABOLIC PANEL - Abnormal; Notable for the following components:   Potassium 7.5 (*)    Chloride 93 (*)    CO2 20 (*)  Glucose, Bld 114 (*)    BUN 102 (*)    Creatinine, Ser 16.45 (*)    Total Protein 8.8 (*)    Albumin 2.7 (*)    AST 45 (*)    ALT 15 (*)    GFR calc non Af Amer 2 (*)    GFR calc Af Amer 3 (*)    Anion gap 24 (*)    All other components within normal limits  URINALYSIS, ROUTINE W REFLEX MICROSCOPIC - Abnormal; Notable for the following components:   Glucose, UA 150 (*)    Hgb urine dipstick SMALL (*)    Protein, ur 100 (*)    All other components within normal limits  RAPID URINE DRUG SCREEN, HOSP PERFORMED - Abnormal; Notable for the following components:   Opiates POSITIVE (*)    Barbiturates   (*)    Value: Result not available. Reagent lot number recalled by manufacturer.   All other components within normal limits  ACETAMINOPHEN LEVEL - Abnormal; Notable for the following components:   Acetaminophen (Tylenol), Serum <10 (*)    All other components within normal limits  PROTIME-INR - Abnormal; Notable for the following components:   Prothrombin Time 15.8 (*)    All other components within normal limits  BASIC METABOLIC PANEL - Abnormal; Notable for the following components:   Potassium 5.9 (*)    Chloride 93 (*)    Glucose, Bld 102 (*)    BUN 105 (*)    Creatinine, Ser 16.38 (*)    GFR calc  non Af Amer 2 (*)    GFR calc Af Amer 3 (*)    Anion gap 24 (*)    All other components within normal limits  GLUCOSE, CAPILLARY - Abnormal; Notable for the following components:   Glucose-Capillary 107 (*)    All other components within normal limits  CK - Abnormal; Notable for the following components:   Total CK 3,519 (*)    All other components within normal limits  LACTIC ACID, PLASMA - Abnormal; Notable for the following components:   Lactic Acid, Venous 2.1 (*)    All other components within normal limits  LACTIC ACID, PLASMA - Abnormal; Notable for the following components:   Lactic Acid, Venous 5.3 (*)    All other components within normal limits  CBC - Abnormal; Notable for the following components:   WBC 11.8 (*)    Hemoglobin 11.4 (*)    HCT 35.5 (*)    RDW 16.7 (*)    All other components within normal limits  BASIC METABOLIC PANEL - Abnormal; Notable for the following components:   Sodium 133 (*)    Chloride 89 (*)    Glucose, Bld 129 (*)    BUN 50 (*)    Creatinine, Ser 9.31 (*)    GFR calc non Af Amer 5 (*)    GFR calc Af Amer 6 (*)    Anion gap 21 (*)    All other components within normal limits  PHOSPHORUS - Abnormal; Notable for the following components:   Phosphorus 7.4 (*)    All other components within normal limits  GLUCOSE, CAPILLARY - Abnormal; Notable for the following components:   Glucose-Capillary 103 (*)    All other components within normal limits  BASIC METABOLIC PANEL - Abnormal; Notable for the following components:   Sodium 126 (*)    Potassium 5.7 (*)    Chloride 86 (*)    CO2 19 (*)    Glucose,  Bld 359 (*)    BUN 55 (*)    Creatinine, Ser 9.42 (*)    Calcium 8.1 (*)    GFR calc non Af Amer 5 (*)    GFR calc Af Amer 6 (*)    Anion gap 21 (*)    All other components within normal limits  GLUCOSE, CAPILLARY - Abnormal; Notable for the following components:   Glucose-Capillary 160 (*)    All other components within normal limits    CBC - Abnormal; Notable for the following components:   WBC 13.5 (*)    RBC 3.59 (*)    Hemoglobin 9.6 (*)    HCT 28.4 (*)    RDW 15.9 (*)    All other components within normal limits  TROPONIN I - Abnormal; Notable for the following components:   Troponin I 0.30 (*)    All other components within normal limits  RENAL FUNCTION PANEL - Abnormal; Notable for the following components:   Sodium 133 (*)    Potassium 5.4 (*)    Chloride 93 (*)    Glucose, Bld 123 (*)    BUN 55 (*)    Creatinine, Ser 9.06 (*)    Calcium 8.7 (*)    Phosphorus 9.9 (*)    Albumin 1.8 (*)    GFR calc non Af Amer 5 (*)    GFR calc Af Amer 6 (*)    Anion gap 17 (*)    All other components within normal limits  GLUCOSE, CAPILLARY - Abnormal; Notable for the following components:   Glucose-Capillary 151 (*)    All other components within normal limits  LACTIC ACID, PLASMA - Abnormal; Notable for the following components:   Lactic Acid, Venous 5.4 (*)    All other components within normal limits  LACTIC ACID, PLASMA - Abnormal; Notable for the following components:   Lactic Acid, Venous 3.9 (*)    All other components within normal limits  LACTIC ACID, PLASMA - Abnormal; Notable for the following components:   Lactic Acid, Venous 2.1 (*)    All other components within normal limits  GLUCOSE, CAPILLARY - Abnormal; Notable for the following components:   Glucose-Capillary 124 (*)    All other components within normal limits  CBG MONITORING, ED - Abnormal; Notable for the following components:   Glucose-Capillary 135 (*)    All other components within normal limits  I-STAT CG4 LACTIC ACID, ED - Abnormal; Notable for the following components:   Lactic Acid, Venous 3.21 (*)    All other components within normal limits  I-STAT CHEM 8, ED - Abnormal; Notable for the following components:   Sodium 134 (*)    Potassium 7.4 (*)    BUN 91 (*)    Creatinine, Ser 15.70 (*)    Glucose, Bld 114 (*)    Calcium,  Ion 0.97 (*)    Hemoglobin 11.6 (*)    HCT 34.0 (*)    All other components within normal limits  I-STAT TROPONIN, ED - Abnormal; Notable for the following components:   Troponin i, poc 0.10 (*)    All other components within normal limits  I-STAT ARTERIAL BLOOD GAS, ED - Abnormal; Notable for the following components:   pO2, Arterial 152.0 (*)    All other components within normal limits  CULTURE, BLOOD (ROUTINE X 2)  CULTURE, BLOOD (ROUTINE X 2)  CULTURE, RESPIRATORY (NON-EXPECTORATED)  MRSA PCR SCREENING  SALICYLATE LEVEL  LIPASE, BLOOD  PROCALCITONIN  GLUCOSE, CAPILLARY  GLUCOSE, CAPILLARY  MAGNESIUM  PROCALCITONIN  GLUCOSE, CAPILLARY  PROCALCITONIN  APTT  VANCOMYCIN, RANDOM  GLUCOSE, CAPILLARY  GLUCOSE, CAPILLARY  RENAL FUNCTION PANEL  POC OCCULT BLOOD, ED  POCT ACTIVATED CLOTTING TIME  POCT ACTIVATED CLOTTING TIME  POCT ACTIVATED CLOTTING TIME  POCT ACTIVATED CLOTTING TIME  TYPE AND SCREEN    EKG EKG Interpretation  Date/Time:  Saturday 2017/08/02 18:07:32 EDT Ventricular Rate:  97 PR Interval:    QRS Duration: 70 QT Interval:  432 QTC Calculation: 566 R Axis:   66 Text Interpretation:  Second degree AV block, Mobitz II Consider right atrial enlargement Abnrm T, consider ischemia, anterolateral lds Prolonged QT interval Confirmed by Meridee Score [phone]) on August 02, 2017 6:21:22 PM Also confirmed by Meridee Score [phone]), editor Josephine Igo ([id number])  on 07/13/2017 10:04:35 AM   Radiology Ct Head Wo Contrast  Result Date: August 02, 2017 CLINICAL DATA:  Altered level of consciousness unexplained. EXAM: CT HEAD WITHOUT CONTRAST TECHNIQUE: Contiguous axial images were obtained from the base of the skull through the vertex without intravenous contrast. COMPARISON:  None. FINDINGS: Brain: Mild age related involutional changes of the brain with chronic appearing small vessel ischemic disease of periventricular white matter. No large vascular territory infarct,  hemorrhage or midline shift. No effacement of the basal cisterns or fourth ventricle. No intra-axial mass nor extra-axial fluid collections. Vascular: No hyperdense vessel or unexpected calcification. Skull: Negative for fracture or focal osseous lesions. Sinuses/Orbits: No acute finding. Other: Partially included endotracheal and gastric tubes are noted. 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 No acute intracranial abnormality. Electronically Signed   By: Tollie Eth M.D.   On: 02-Aug-2017 19:07   Dg Chest Port 1 View  Result Date: 2017-08-02 CLINICAL DATA:  Intubation.  Respiratory arrest. EXAM: PORTABLE CHEST 1 VIEW COMPARISON:  12/05/2016 FINDINGS: Endotracheal tube terminates 3.4 cm above carina.Nasogastric tube extends beyond the inferior aspect of the film. Right-sided dialysis catheter tip at high right atrium. Midline trachea. Normal heart size for level of inspiration. Atherosclerosis in the transverse aorta. No right and no definite left pleural effusion. Improved mild pulmonary venous congestion, as evidenced by perihilar interstitial prominence. Mild left base airspace disease is similar. IMPRESSION: Mild pulmonary venous congestion, improved since the prior. Left base airspace disease is unchanged and favored to represent atelectasis. Appropriate position of endotracheal tube. Aortic Atherosclerosis (ICD10-I70.0). Electronically Signed   By: Jeronimo Greaves M.D.   On: 02-Aug-2017 17:38    Procedures Procedure Name: Intubation Date/Time: 2017/08/02 5:34 PM Performed by: Terrilee Files, MD Pre-anesthesia Checklist: Patient identified, Patient being monitored, Emergency Drugs available, Timeout performed and Suction available Oxygen Delivery Method: Ambu bag Preoxygenation: Pre-oxygenation with 100% oxygen Induction Type: Rapid sequence Ventilation: Mask ventilation without difficulty Laryngoscope Size: Glidescope and 4 Grade View: Grade II Tube size: 7.5 mm Number of  attempts: 2 Airway Equipment and Method: Video-laryngoscopy Placement Confirmation: ETT inserted through vocal cords under direct vision,  CO2 detector and Breath sounds checked- equal and bilateral Tube secured with: ETT holder Dental Injury: Teeth and Oropharynx as per pre-operative assessment  Difficulty Due To: Difficulty was anticipated, Difficult Airway- due to dentition and Difficult Airway- due to limited oral opening    OG placement Date/Time: 08/02/2017 5:36 PM Performed by: Terrilee Files, MD Authorized by: Terrilee Files, MD  Consent: The procedure was performed in an emergent situation. Local anesthesia used: no  Anesthesia: Local anesthesia used: no  Sedation: Patient sedated: yes Sedatives: etomidate Vitals: Vital signs were monitored during sedation.  Patient tolerance:  Patient tolerated the procedure well with no immediate complications Comments: OG placed after intubation with rsi meds  .Critical Care Performed by: Terrilee FilesButler, Marceil Welp C, MD Authorized by: Terrilee FilesButler, Syriana Croslin C, MD   Critical care provider statement:    Critical care time (minutes):  60   Critical care time was exclusive of:  Separately billable procedures and treating other patients   Critical care was necessary to treat or prevent imminent or life-threatening deterioration of the following conditions:  CNS failure or compromise, metabolic crisis and respiratory failure   Critical care was time spent personally by me on the following activities:  Evaluation of patient's response to treatment, examination of patient, obtaining history from patient or surrogate, ordering and performing treatments and interventions, ordering and review of laboratory studies, ordering and review of radiographic studies, pulse oximetry, re-evaluation of patient's condition, review of old charts, ventilator management and discussions with consultants   I assumed direction of critical care for this patient from another  provider in my specialty: no     (including critical care time)  Medications Ordered in ED Medications  rocuronium (ZEMURON) injection (7 mg Intravenous Given 11-25-2017 1650)  etomidate (AMIDATE) injection (20 mg Intravenous Given 11-25-2017 1652)  sodium chloride 0.9 % bolus 1,000 mL (has no administration in time range)  fentaNYL (SUBLIMAZE) injection 50 mcg (has no administration in time range)  fentaNYL 2500mcg in NS 250mL (6610mcg/ml) infusion-PREMIX (has no administration in time range)  fentaNYL (SUBLIMAZE) bolus via infusion 25 mcg (has no administration in time range)  midazolam (VERSED) injection 1 mg (has no administration in time range)  midazolam (VERSED) injection 1 mg (has no administration in time range)     Initial Impression / Assessment and Plan / ED Course  I have reviewed the triage vital signs and the nursing notes.  Pertinent labs & imaging results that were available during my care of the patient were reviewed by me and considered in my medical decision making (see chart for details).  Clinical Course as of Jul 14 1044  Sat Jul 12, 2017  1707 Patient is difficult IV access.  He had an IV placed by the paramedics in the left side which is the site of his fistula that we have that up using for his  RSI.  Intubated with a 7.5 by quad scope 2 times due to needing to move to up for as he was very deep.  Blood pressure is low on emergence from medications tachycardic.  Getting a saline bolus.  OG placed by me.  Awaiting portable chest.  Equal breath sounds bilaterally and positive end-tidal capnography.   [MB]  1733 Evaluation patient now and improved heart rate improved blood pressure.  EKG with peaked he is awaiting stat chemistry for evaluation of possible hyper kalemia   [MB]  1753 Discussed with Dr. Marisue HumbleSanford from nephrology who will evaluate the patient.  Also discussed with ICU attending who will evaluate the patient in the ED.   [MB]  1802 Patient hyperkalemic getting  IV therapy with calcium D50 insulin and sodium bicarbonate.  Cardiac rhythm on the monitor still seems to be abnormal.  Asked for repeat EKG.   [MB]  1819 Patient's ABG looks very good on current settings.  Nephrology Dr. Verna CzechStanford actually knows the patient from his clinic and will get the patient set up for dialysis up in the ICU.  Reviewed the patient having this arrhythmia and we both agree that we should improve his metabolic compromise first.    [  MB]    Clinical Course User Index [MB] Terrilee Files, MD    Final Clinical Impressions(s) / ED Diagnoses   Final diagnoses:  Respiratory arrest [facility])  Hyperkalemia  Cardiac arrhythmia, unspecified cardiac arrhythmia type    ED Discharge Orders    None       Terrilee Files, MD 07/14/17 1048

## 2017-07-12 NOTE — Progress Notes (Signed)
eLink Physician-Brief Progress Note Patient Name: [name] LemonsSylvester Norris DOB: 06-09-1945 MRN: [id number]   Date of Service  27-Jul-2017  HPI/Events of Note  Patient intubated and ventilated. Notified of need for stress ulcer prophylaxis.   eICU Interventions  Will order: 1. Pepcid 20 mg IV BID.     Intervention Category Intermediate Interventions: Best-practice therapies (e.g. DVT, beta blocker, etc.)  Sommer,Steven Eugene 27-Jul-2017, 9:51 PM

## 2017-07-12 NOTE — Consult Note (Addendum)
Jarone Ostergaard Admit Date: 08-02-17 08-02-17 Arita Miss Requesting Physician:  Charm Barges MD  Reason for Consult:  ESRD, Found unresponsive at home, Hyperkalemia HPI:  72 year old male who was found unresponsive at home today during a wellness check.  By report there is emesis around him.  By report there were narcotics around him.  He received Narcan in the field.  Concern for aspiration.  He required intubation upon arrival.  Patient last dialysis was on Tuesday, 6/11; he received a full treatment but left with 4.1 kg of excess fluid on board.  In the emergency room labs demonstrate a potassium of 7.5.  Serum bicarbonate of 20.  ABG 7.3 7/40/152 post intubation.  Chest x-ray with mild pulmonary venous congestion and appropriate placement of endotracheal tube.  EKG with peaked T waves, no clear PR interval (P waves present at times)  In ED rec insulin/dextrose, bicarb push, CaGluconate  Patient was encephalopathic and now intubated and sedated, unable to contribute.  PMH Incudes:  Hypertension  Diabetes  Outpt HD Orders Unit: [place] Days: [facility] Time: 4h Dialyzer: F180 EDW: 80kg (never comes within 2kg, often with >8kg of weight gain) K/Ca: 3k / 2 Ca Access: TDC, maturing LUE AVF Needle Size: n/a BFR/DFR: 400/800 UF Proflie: none VDRA: C3 2.31mcg qTx EPO: Mircera q2wk, last rec 5/30 IV Fe: Venofer 50mg  qWk Heparin: 5000 IU IVB qTx Most Recent Phos / PTH: 10.9 / 1626 Most Recent TSAT: 36 Most Recent eKT/V: 1.34 Treatment Adherence: fair, often signs on 15-30min early Last Hb 6/6 was 10.2   Creatinine, Ser (mg/dL)  Date Value  11/91/4782 15.70 (H)  08/02/2017 16.45 (H)  06/13/2017 6.30 (H)  12/20/2016 6.52 (H)  12/18/2016 7.40 (H)  12/17/2016 10.14 (H)  12/16/2016 9.68 (H)  12/15/2016 13.10 (H)  12/15/2016 13.11 (H)  12/14/2016 13.27 (H)  ]  ROS Unable to assess  PMH  Past Medical History:  Diagnosis Date  . Diabetes mellitus without complication  (HCC)   . Hypertension   . Neuropathy   . Renal disorder    PSH  Past Surgical History:  Procedure Laterality Date  . A/V FISTULAGRAM N/A 06/13/2017   Procedure: A/V FISTULAGRAM - Left AV;  Surgeon: Sherren Kerns, MD;  Location: [facility] INVASIVE CV LAB;  Service: Cardiovascular;  Laterality: N/A;  . AV FISTULA PLACEMENT Left 12/20/2016   Procedure: ARTERIOVENOUS (AV) FISTULA CREATION;  Surgeon: Chuck Hint, MD;  Location: [facility] OR;  Service: Vascular;  Laterality: Left;  . AV FISTULA PLACEMENT Left 04/07/2017   Procedure: ARTERIOVENOUS (AV) BRACHIOCEPHALIC FISTULA CREATION;  Surgeon: Chuck Hint, MD;  Location: [facility] OR;  Service: Vascular;  Laterality: Left;  . COLONOSCOPY    . INSERTION OF DIALYSIS CATHETER Right 12/15/2016   Procedure: INSERTION OF TUNNEL DIALYSIS CATHETER - Right Internal Jugular;  Surgeon: Fransisco Hertz, MD;  Location: [facility] OR;  Service: Vascular;  Laterality: Right;  . LIGATION OF ARTERIOVENOUS  FISTULA Left 04/07/2017   Procedure: LIGATION OF ARTERIOVENOUS  FISTULA RADIOCEPHALIC;  Surgeon: Chuck Hint, MD;  Location: [facility] OR;  Service: Vascular;  Laterality: Left;  . PERIPHERAL VASCULAR BALLOON ANGIOPLASTY  06/13/2017   Procedure: PERIPHERAL VASCULAR BALLOON ANGIOPLASTY;  Surgeon: Sherren Kerns, MD;  Location: [facility] INVASIVE CV LAB;  Service: Cardiovascular;;  left AV fistula   FH  Family History  Problem Relation Age of Onset  . Cancer Mother        Patient does not know which type of cancer  . Hypertension  Father    SH  reports that he has quit smoking. His smoking use included cigarettes. He has never used smokeless tobacco. He reports that he does not drink alcohol or use drugs. Allergies  Allergies  Allergen Reactions  . Penicillins Nausea And Vomiting and Rash    Has patient had a PCN reaction causing immediate rash, facial/tongue/throat swelling, SOB or lightheadedness with hypotension: No Has patient had a PCN reaction causing  severe rash involving mucus membranes or skin necrosis: No Has patient had a PCN reaction that required hospitalization: No Has patient had a PCN reaction occurring within the last 10 years: No If all of the above answers are "NO", then may proceed with Cephalosporin use.    Home medications Prior to Admission medications   Medication Sig Start Date End Date Taking? Authorizing Provider  amLODipine (NORVASC) 10 MG tablet Take 10 mg daily by mouth.    [provider]  Cholecalciferol (VITAMIN D-3) 1000 units CAPS Take 1,000 Units daily by mouth.    [provider]  cloNIDine (CATAPRES) 0.1 MG tablet Take 0.1 mg 2 (two) times daily by mouth.    [provider]  ferric citrate (AURYXIA) 1 GM 210 MG(Fe) tablet Take 630 mg by mouth 3 (three) times daily with meals.    [provider]  ferrous sulfate 325 (65 FE) MG tablet Take 325 mg 3 (three) times daily with meals by mouth.    [provider]  gabapentin (NEURONTIN) 400 MG capsule Take 400 mg at bedtime by mouth.    [provider]  guaiFENesin-codeine (ROBITUSSIN AC) 100-10 MG/5ML syrup Take by mouth.    [provider]  hydrALAZINE (APRESOLINE) 50 MG tablet Take 50 mg by mouth daily.    [provider]  Hypromellose (ARTIFICIAL TEARS OP) Apply 1 drop to eye daily as needed (dry eyes).    [provider]  insulin glargine (LANTUS) 100 UNIT/ML injection Inject 4 Units at bedtime into the skin.     [provider]  oxyCODONE (OXY IR/ROXICODONE) 5 MG immediate release tablet Take 5 mg by mouth every 4 (four) hours as needed for severe pain.    [provider]  sildenafil (VIAGRA) 100 MG tablet Take 100 mg daily as needed by mouth for erectile dysfunction.    [provider]  sodium bicarbonate 650 MG tablet Take 650 mg every 8 (eight) hours by mouth.    [provider]  terazosin (HYTRIN) 5 MG capsule Take 5 mg at bedtime by mouth.     [provider]    Current Medications Scheduled Meds: . [START ON 07/13/2017] Chlorhexidine Gluconate Cloth  6 each Topical Q0600   Continuous Infusions: . fentaNYL infusion INTRAVENOUS 50 mcg/hr (06/29/2017 1727)  . sodium chloride 1,000 mL (06/28/2017 1729)   PRN Meds:.fentaNYL, midazolam, midazolam  CBC Recent Labs  Lab 07/24/2017 1726 07/14/2017 1729  WBC 3.7*  --   NEUTROABS PENDING  --   HGB 11.8* 11.6*  HCT 36.7* 34.0*  MCV 82.1  --   PLT 267  --    Basic Metabolic Panel Recent Labs  Lab 07/07/2017 1726 07/26/2017 1729  NA 137 134*  K 7.5* 7.4*  CL 93* 101  CO2 20*  --   GLUCOSE 114* 114*  BUN 102* 91*  CREATININE 16.45* 15.70*  CALCIUM 9.3  --     Physical Exam  There were no vitals taken for this visit. GEN: Intubated, sedated ENT: Eyes closed, NCAT CV: RRR, no  rub PULM: Coarse breath sounds bilaterally ABD: Soft, nontender SKIN: Right IJ tunneled dialysis catheter intact without any crusting or purulence noted EXT: Left upper extremity AV fistula with positive bruit and thrill; 2+ edema bilaterally in the legs   Assessment 72 year old male ESRD presenting with encephalopathy, respiratory failure requiring intubation, hyperkalemia with EKG changes after missing 2 dialysis treatments.  1. ESRD, R IJ TDC, [place], Maturing LUE AVF 2. Hyperkalemia with peaked T waves 3. AMS/Encephalopathy, ? Narcotic exposure 4. VDRF intubated 6/15 5. 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 HTN, BP stable currently  Plan 1. HD now, 1K x then 2K, 4h Tx, 1-2L UF today, Use TDC, NO heparin right now.  2.5 Ca.    Sabra Heck MD [phone] pgr 07-23-2017, 6:21 PM

## 2017-07-13 DIAGNOSIS — J9601 Acute respiratory failure with hypoxia: Secondary | ICD-10-CM

## 2017-07-13 DIAGNOSIS — N19 Unspecified kidney failure: Secondary | ICD-10-CM

## 2017-07-13 LAB — BASIC METABOLIC PANEL
Anion gap: 21 — ABNORMAL HIGH (ref 5–15)
Anion gap: 21 — ABNORMAL HIGH (ref 5–15)
BUN: 50 mg/dL — AB (ref 6–20)
BUN: 55 mg/dL — AB (ref 6–20)
CHLORIDE: 86 mmol/L — AB (ref 101–111)
CO2: 23 mmol/L (ref 22–32)
CO2: 19 mmol/L — ABNORMAL LOW (ref 22–32)
CREATININE: 9.31 mg/dL — AB (ref 0.61–1.24)
Calcium: 8.9 mg/dL (ref 8.9–10.3)
Calcium: 8.1 mg/dL — ABNORMAL LOW (ref 8.9–10.3)
Chloride: 89 mmol/L — ABNORMAL LOW (ref 101–111)
Creatinine, Ser: 9.42 mg/dL — ABNORMAL HIGH (ref 0.61–1.24)
GFR calc non Af Amer: 5 mL/min — ABNORMAL LOW (ref >60)
GFR, EST AFRICAN AMERICAN: 6 mL/min — AB (ref >60)
GFR, EST AFRICAN AMERICAN: 6 mL/min — AB (ref >60)
GFR, EST NON AFRICAN AMERICAN: 5 mL/min — AB (ref >60)
Glucose, Bld: 129 mg/dL — ABNORMAL HIGH (ref 65–99)
Glucose, Bld: 359 mg/dL — ABNORMAL HIGH (ref 65–99)
POTASSIUM: 5.7 mmol/L — AB (ref 3.5–5.1)
Potassium: 4.9 mmol/L (ref 3.5–5.1)
SODIUM: 133 mmol/L — AB (ref 135–145)
SODIUM: 126 mmol/L — AB (ref 135–145)

## 2017-07-13 LAB — CBC
HCT: 35.5 % — ABNORMAL LOW (ref 39.0–52.0)
HEMOGLOBIN: 11.4 g/dL — AB (ref 13.0–17.0)
MCH: 26.7 pg (ref 26.0–34.0)
MCHC: 32.1 g/dL (ref 30.0–36.0)
MCV: 83.1 fL (ref 78.0–100.0)
PLATELETS: 205 10*3/uL (ref 150–400)
RBC: 4.27 MIL/uL (ref 4.22–5.81)
RDW: 16.7 % — ABNORMAL HIGH (ref 11.5–15.5)
WBC: 11.8 10*3/uL — ABNORMAL HIGH (ref 4.0–10.5)

## 2017-07-13 LAB — GLUCOSE, CAPILLARY
GLUCOSE-CAPILLARY: 92 mg/dL (ref 65–99)
Glucose-Capillary: 103 mg/dL — ABNORMAL HIGH (ref 65–99)
Glucose-Capillary: 97 mg/dL (ref 65–99)
Glucose-Capillary: 160 mg/dL — ABNORMAL HIGH (ref 65–99)
Glucose-Capillary: 151 mg/dL — ABNORMAL HIGH (ref 65–99)

## 2017-07-13 LAB — PHOSPHORUS: Phosphorus: 7.4 mg/dL — ABNORMAL HIGH (ref 2.5–4.6)

## 2017-07-13 LAB — MAGNESIUM: Magnesium: 1.7 mg/dL (ref 1.7–2.4)

## 2017-07-13 LAB — LACTIC ACID, PLASMA: Lactic Acid, Venous: 5.3 mmol/L (ref 0.5–1.9)

## 2017-07-13 LAB — PROCALCITONIN: PROCALCITONIN: 85.17 ng/mL

## 2017-07-13 LAB — TROPONIN I: TROPONIN I: 0.3 ng/mL — AB (ref <0.03)

## 2017-07-13 LAB — MRSA PCR SCREENING: MRSA BY PCR: NEGATIVE

## 2017-07-13 MED ORDER — FENTANYL CITRATE (PF) 100 MCG/2ML IJ SOLN: 50.0000 ug | INTRAMUSCULAR | Status: DC | PRN

## 2017-07-13 MED ORDER — ACETAMINOPHEN 160 MG/5ML PO SOLN
650.0000 mg | Freq: Four times a day (QID) | ORAL | Status: DC | PRN
  Administered 2017-07-15 – 2017-07-20 (×3): 650 mg
  Filled 2017-07-13 (×3): qty 20.3

## 2017-07-13 MED ORDER — HEPARIN SODIUM (PORCINE) 1000 UNIT/ML DIALYSIS
1000.0000 [IU] | INTRAMUSCULAR | Status: DC | PRN
  Administered 2017-07-14: 3400 [IU] via INTRAVENOUS_CENTRAL
  Filled 2017-07-13: qty 6
  Filled 2017-07-13: qty 1
  Filled 2017-07-13: qty 6
  Filled 2017-07-13: qty 3

## 2017-07-13 MED ORDER — SODIUM CHLORIDE 0.9 % IJ SOLN
250.0000 [IU]/h | INTRAMUSCULAR | Status: DC
  Administered 2017-07-13: 50 [IU]/h via INTRAVENOUS_CENTRAL
  Administered 2017-07-14: 400 [IU]/h via INTRAVENOUS_CENTRAL
  Filled 2017-07-13 (×2): qty 2

## 2017-07-13 MED ORDER — INSULIN ASPART 100 UNIT/ML ~~LOC~~ SOLN
5.0000 [IU] | Freq: Once | SUBCUTANEOUS | Status: AC
  Administered 2017-07-13: 5 [IU] via SUBCUTANEOUS

## 2017-07-13 MED ORDER — PRISMASOL BGK 4/2.5 32-4-2.5 MEQ/L IV SOLN
INTRAVENOUS | Status: DC
  Administered 2017-07-13 – 2017-07-14 (×2): via INTRAVENOUS_CENTRAL
  Filled 2017-07-13 (×7): qty 5000

## 2017-07-13 MED ORDER — SODIUM CHLORIDE 0.9 % IV SOLN
1.0000 g | INTRAVENOUS | Status: AC
  Administered 2017-07-13: 1 g via INTRAVENOUS
  Filled 2017-07-13: qty 10

## 2017-07-13 MED ORDER — DEXTROSE 50 % IV SOLN
1.0000 | Freq: Once | INTRAVENOUS | Status: AC
  Administered 2017-07-13: 50 mL via INTRAVENOUS
  Filled 2017-07-13: qty 50

## 2017-07-13 MED ORDER — FENTANYL CITRATE (PF) 100 MCG/2ML IJ SOLN
50.0000 ug | INTRAMUSCULAR | Status: DC | PRN
  Administered 2017-07-15 – 2017-07-19 (×3): 50 ug via INTRAVENOUS
  Filled 2017-07-13 (×3): qty 2

## 2017-07-13 MED ORDER — HEPARIN BOLUS VIA INFUSION (CRRT)
1000.0000 [IU] | INTRAVENOUS | Status: DC | PRN
  Filled 2017-07-13: qty 1000

## 2017-07-13 MED ORDER — PRISMASOL BGK 4/2.5 32-4-2.5 MEQ/L IV SOLN
INTRAVENOUS | Status: DC
  Administered 2017-07-13 – 2017-07-14 (×4): via INTRAVENOUS_CENTRAL
  Filled 2017-07-13 (×32): qty 5000

## 2017-07-13 MED ORDER — CALCIUM GLUCONATE 10 % IV SOLN
1.0000 g | Freq: Once | INTRAVENOUS | Status: DC
  Filled 2017-07-13: qty 10

## 2017-07-13 MED ORDER — ACETAMINOPHEN 160 MG/5ML PO SOLN
650.0000 mg | Freq: Three times a day (TID) | ORAL | Status: DC | PRN
  Administered 2017-07-13: 650 mg
  Filled 2017-07-13: qty 20.3

## 2017-07-13 MED ORDER — PIPERACILLIN-TAZOBACTAM 3.375 G IVPB
3.3750 g | Freq: Four times a day (QID) | INTRAVENOUS | Status: DC
  Administered 2017-07-13 – 2017-07-14 (×2): 3.375 g via INTRAVENOUS
  Filled 2017-07-13 (×3): qty 50

## 2017-07-13 MED ORDER — VITAL HIGH PROTEIN PO LIQD
1000.0000 mL | ORAL | Status: DC
  Administered 2017-07-13 – 2017-07-16 (×4): 1000 mL

## 2017-07-13 MED ORDER — DOPAMINE-DEXTROSE 3.2-5 MG/ML-% IV SOLN
0.0000 ug/kg/min | INTRAVENOUS | Status: DC
  Administered 2017-07-13: 2.5 ug/kg/min via INTRAVENOUS
  Filled 2017-07-13: qty 250

## 2017-07-13 MED ORDER — SODIUM CHLORIDE 0.9 % FOR CRRT
INTRAVENOUS_CENTRAL | Status: DC | PRN
  Filled 2017-07-13: qty 1000

## 2017-07-13 MED ORDER — PRISMASOL BGK 0/2.5 32-2.5 MEQ/L IV SOLN
INTRAVENOUS | Status: DC
  Administered 2017-07-13 – 2017-07-14 (×2): via INTRAVENOUS_CENTRAL
  Filled 2017-07-13 (×10): qty 5000

## 2017-07-13 NOTE — Progress Notes (Signed)
Pharmacy Antibiotic Note  [name] Dudley is a 72 y.o. male admitted on 16-Mar-2017 with sepsis.  Pharmacy originally consulted for Vancomycin and Zosyn dosing.  Noted PCN allergy listed as N/V, rash but listed as low-grade. Appears to be tolerating without issues.    ESRD now starting CRRT. Last Vanc dose was 1750 mg on 6/15 @ 2330. CRRT orders just entered - unclear when it will actually be initiated.   Plan: - Adjust Zosyn to 3.375g IV every 6 hours - No standing Vanc - Will obtain a Vancomycin random level on 6/17 AM to guide additional doses - Will continue to follow renal function, culture results, LOT, and antibiotic de-escalation plans   Height: 5\' 7"  (170.2 cm) Weight: 187 lb 2.7 oz (84.9 kg) IBW/kg (Calculated) : 66.1  Temp (24hrs), Avg:100.5 F (38.1 C), Min:98.1 F (36.7 C), Max:103.1 F (39.5 C)  Recent Labs  Lab 2017/09/23 1726 2017/09/23 1729 2017/09/23 2150 07/13/17 0905 07/13/17 1522  WBC 3.7*  --   --  11.8*  --   CREATININE 16.45* 15.70* 16.38* 9.31* 9.42*  LATICACIDVEN  --  3.21* 2.1* 5.3*  --     Estimated Creatinine Clearance: 7.4 mL/min (A) (by C-G formula based on SCr of 9.42 mg/dL (H)).    Allergies  Allergen Reactions  . Penicillins Nausea And Vomiting and Rash    Has patient had a PCN reaction causing immediate rash, facial/tongue/throat swelling, SOB or lightheadedness with hypotension: No Has patient had a PCN reaction causing severe rash involving mucus membranes or skin necrosis: No Has patient had a PCN reaction that required hospitalization: No Has patient had a PCN reaction occurring within the last 10 years: No If all of the above answers are "NO", then may proceed with Cephalosporin use.     Antimicrobials this admission: Vanc 6/15>> Zosyn 6/15 >>  Dose adjustments this admission: N/A  Microbiology results: 6/15 BCx >> 6/15 RCx >> 6/15 MRSA PCR >>  Thank you for allowing pharmacy to be a part of this patient's care.  Georgina PillionElizabeth  Luciann Gossett, PharmD, BCPS Clinical Pharmacist Pager: [phone] 07/13/2017 5:42 PM

## 2017-07-13 NOTE — Progress Notes (Addendum)
Pt went into 3rd degree heart block - text page to CC NP - awaiting orders. Placed external pacing pads on pt. Ventricular response is between 55 and 60 - BP stable on 15 Mcg of Levophed.Pt had been in 2 degree Mobitz 1 with wandering pacemaker and CC was aware.

## 2017-07-13 NOTE — Progress Notes (Signed)
Pt temp 102.2 F orally RN aware and ice packs placed on pt.

## 2017-07-13 NOTE — Progress Notes (Signed)
Admit: 07/06/2017 LOS: 41  72 year old male ESRD presenting with encephalopathy, respiratory failure requiring intubation, hyperkalemia with EKG changes after missing 2 dialysis treatments.  Subjective:   HD overnight, no UF, req pressors   K 4.9 this AM  Febrile  Outpt HD Orders Unit: [place] Days: [facility] Time: 4h Dialyzer: F180 EDW: 80kg (never comes within 2kg, often with >8kg of weight gain) K/Ca: 3k / 2 Ca Access: TDC, maturing LUE AVF Needle Size: n/a BFR/DFR: 400/800 UF Proflie: none VDRA: C3 2.85mg qTx EPO: Mircera 683m q2wk, last rec 5/30 IV Fe: Venofer 503mWk Heparin: 5000 IU IVB qTx Most Recent Phos / PTH: 10.9 / 1626 Most Recent TSAT: 36 Most Recent eKT/V: 1.34 Treatment Adherence: fair, often signs on 15-30min early Last Hb 6/6 was 10.2     06/15 0701 - 06/16 0700 In: 1540.8 [I.V.:440.8; IV Piggyback:1100] Out: 227 [Urine:22; Emesis/NG output:200]  Filed Weights   07/25/2017 2000 07/13/17 0032 07/13/17 0500  Weight: 84.5 kg (186 lb 4.6 oz) 84.5 kg (186 lb 4.6 oz) 84.9 kg (187 lb 2.7 oz)    Scheduled Meds: . chlorhexidine gluconate (MEDLINE KIT)  15 mL Mouth Rinse BID  . enoxaparin (LOVENOX) injection  30 mg Subcutaneous Q24H  . insulin aspart  0-9 Units Subcutaneous Q4H  . mouth rinse  15 mL Mouth Rinse 10 times per day   Continuous Infusions: . sodium chloride    . albumin human    . famotidine (PEPCID) IV Stopped (07/13/17 0255)  . fentaNYL infusion INTRAVENOUS 50 mcg/hr (07/08/2017 2024)  . norepinephrine (LEVOPHED) Adult infusion 16 mcg/min (07/13/17 0540)  . piperacillin-tazobactam Stopped (07/13/17 0202)   PRN Meds:.sodium chloride, albumin human, docusate, fentaNYL  Current Labs: reviewed    Physical Exam:  Blood pressure 124/64, pulse 94, temperature (!) 101.5 F (38.6 C), temperature source Oral, resp. rate 18, height 5' 7" (1.702 m), weight 84.9 kg (187 lb 2.7 oz), SpO2 100 %. GEN: Intubated, sedated ENT: Eyes closed, NCAT CV: RRR,  no rub PULM: Coarse breath sounds bilaterally ABD: Soft, nontender SKIN: Right IJ tunneled dialysis catheter intact without any crusting or purulence noted EXT: Left upper extremity AV fistula with positive bruit and thrill; 2+ edema bilaterally in the legs  A 1. ESRD, R IJ TDC, [place], Maturing LUE AVF 2. Fevers, B Cx 6/15 pending; vanc/zosyn per CCM 3. Hypotension, presumed septic shock on pressors 4. Hyperkalemia with peaked T waves -- resolved with HD 6/15 5. AMS/Encephalopathy, ? Narcotic exposure 6. VDRF intubated 6/15 for AMS, airway protection 7. 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 HTN chronically  P 1. Will need dialysis tomorrow, likely CRRT 2. Repeat labs this PM 3. Reassess prior 4. Daily weights, Daily Renal Panel, Strict I/Os, Avoid nephrotoxins (NSAIDs, judicious IV Contrast)    [name] Dudley 07/13/2017, 6:55 AM  Recent Labs  Lab 07/15/2017 1726 07/04/2017 1729 07/08/2017 2150  NA 137 134* 143  K 7.5* 7.4* 5.9*  CL 93* 101 93*  CO2 20*  --  26  GLUCOSE 114* 114* 102*  BUN 102* 91* 105*  CREATININE 16.45* 15.70* 16.38*  CALCIUM 9.3  --  9.0   Recent Labs  Lab 07/24/2017 1726 07/02/2017 1729  WBC 3.7*  --   NEUTROABS 2.1  --   HGB 11.8* 11.6*  HCT 36.7* 34.0*  MCV 82.1  --   PLT 267  --

## 2017-07-13 NOTE — Progress Notes (Signed)
Rn notified of pt temp of 103.1 f orally.

## 2017-07-13 NOTE — Progress Notes (Addendum)
PULMONARY / CRITICAL CARE MEDICINE   Name: [name] Dudley MRN: [id number] DOB: 11-Feb-1945    ADMISSION DATE:  2017-07-29 CONSULTATION DATE:  07/29/17  REFERRING MD:  Meridee Score, MD  CHIEF COMPLAINT:  Unresponsive, found down  HISTORY OF PRESENT ILLNESS:   72 year old male history of end-stage renal disease on hemodialysis via right PermCath, insulin-dependent diabetes mellitus who was found down on a wellness check.  No family or friends are present at the time of my examination, however the patient was reportedly last seen normal on Wednesday and per report was not complaining of any sick symptoms at that time.  Police found him unresponsive with vomitus throughout his home and when EMS arrived he was noted to have pinpoint pupils.  There is also report of an empty pill bottle that actually belonged to his friend who normally gives him rides to dialysis, they stated that the name started with hydro-and they believed it to be a narcotic.  Patient was bag mask ventilated in route to the emergency department.  On arrival he was intubated for airway protection.  He was noted to be hyperkalemic for which she received insulin, dextrose, calcium.  SUBJECTIVE:  Tmax 103.1 / WBC 11.8.  RN reports fever / pt packed in ice.  Pt remains on levophed 17 mcg.  Fentanyl gtt at 57mcg's.  No WUA performed at time of rounds.    VITAL SIGNS: BP 124/64   Pulse 95   Temp (!) 103.1 F (39.5 C) (Oral)   Resp 18 Comment: from vent  Ht 5\' 7"  (1.702 m)   Wt 187 lb 2.7 oz (84.9 kg)   SpO2 95%   BMI 29.32 kg/m   HEMODYNAMICS:    VENTILATOR SETTINGS: Vent Mode: PRVC FiO2 (%):  [40 %-100 %] 40 % Set Rate:  [18 bmp] 18 bmp Vt Set:  [530 mL-620 mL] 620 mL PEEP:  [5 cmH20] 5 cmH20 Plateau Pressure:  [17 cmH20-22 cmH20] 21 cmH20  INTAKE / OUTPUT: I/O last 3 completed shifts: In: 1540.8 [I.V.:440.8; IV Piggyback:1100] Out: 227 [Urine:22; Emesis/NG output:200; Other:5]  PHYSICAL EXAMINATION: General:  adult male lying in bed on vent  HEENT: MM pink/moist, pupils 2-64mm / difficult to determine reaction  Neuro: no response on vent, on fentanyl gtt  CV: s1s2 rrr, no m/r/g PULM: even/non-labored, lungs bilaterally clear  WU:JWJX, non-tender, bsx4 active  Extremities: warm/dry, trace to 1+ BLE edema.  LUE AVF with + thrill/bruit Skin: no rashes or lesions  LABS:  BMET Recent Labs  Lab 29-Jul-2017 1726 Jul 29, 2017 1729 2017/07/29 2150  NA 137 134* 143  K 7.5* 7.4* 5.9*  CL 93* 101 93*  CO2 20*  --  26  BUN 102* 91* 105*  CREATININE 16.45* 15.70* 16.38*  GLUCOSE 114* 114* 102*    Electrolytes Recent Labs  Lab 2017/07/29 1726 07/29/17 2150  CALCIUM 9.3 9.0    CBC Recent Labs  Lab 07-29-17 1726 29-Jul-2017 1729 07/13/17 0905  WBC 3.7*  --  11.8*  HGB 11.8* 11.6* 11.4*  HCT 36.7* 34.0* 35.5*  PLT 267  --  205    Coag's Recent Labs  Lab 07/29/2017 1726  INR 1.28    Sepsis Markers Recent Labs  Lab 07-29-2017 1729 29-Jul-2017 2150  LATICACIDVEN 3.21* 2.1*  PROCALCITON  --  46.63    ABG Recent Labs  Lab 2017-07-29 1812  PHART 7.366  PCO2ART 39.6  PO2ART 152.0*    Liver Enzymes Recent Labs  Lab 2017/07/29 1726  AST 45*  ALT 15*  ALKPHOS 85  BILITOT 0.7  ALBUMIN 2.7*    Cardiac Enzymes No results for input(s): TROPONINI, PROBNP in the last 168 hours.  Glucose Recent Labs  Lab 2017-08-07 1715 08/07/2017 1956 2017-08-07 2349 07/13/17 0342 07/13/17 0752  GLUCAP 135* 107* 99 92 103*    Imaging Ct Head Wo Contrast  Result Date: August 07, 2017 CLINICAL DATA:  Altered level of consciousness unexplained. EXAM: CT HEAD WITHOUT CONTRAST TECHNIQUE: Contiguous axial images were obtained from the base of the skull through the vertex without intravenous contrast. COMPARISON:  None. FINDINGS: Brain: Mild age related involutional changes of the brain with chronic appearing small vessel ischemic disease of periventricular white matter. No large vascular territory infarct,  hemorrhage or midline shift. No effacement of the basal cisterns or fourth ventricle. No intra-axial mass nor extra-axial fluid collections. Vascular: No hyperdense vessel or unexpected calcification. Skull: Negative for fracture or focal osseous lesions. Sinuses/Orbits: No acute finding. Other: Partially included endotracheal and gastric tubes are noted. 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 No acute intracranial abnormality. Electronically Signed   By: Tollie Eth M.D.   On: 07-Aug-2017 19:07   Dg Chest Port 1 View  Result Date: 2017-08-07 CLINICAL DATA:  Intubation.  Respiratory arrest. EXAM: PORTABLE CHEST 1 VIEW COMPARISON:  12/05/2016 FINDINGS: Endotracheal tube terminates 3.4 cm above carina.Nasogastric tube extends beyond the inferior aspect of the film. Right-sided dialysis catheter tip at high right atrium. Midline trachea. Normal heart size for level of inspiration. Atherosclerosis in the transverse aorta. No right and no definite left pleural effusion. Improved mild pulmonary venous congestion, as evidenced by perihilar interstitial prominence. Mild left base airspace disease is similar. IMPRESSION: Mild pulmonary venous congestion, improved since the prior. Left base airspace disease is unchanged and favored to represent atelectasis. Appropriate position of endotracheal tube. Aortic Atherosclerosis (ICD10-I70.0). Electronically Signed   By: Jeronimo Greaves M.D.   On: 08/07/2017 17:38     STUDIES:  6/15  CTH >> small vessel ischemic disease, no acute abnormality  6/15  CXR >> interstitial edema, decreased from prior 6/16  EEG >>   CULTURES: BCx2 6/15 >> Tracheal aspirate 6/15 >>  ANTIBIOTICS: Vancomycin 6/15 >>  SIGNIFICANT EVENTS: 6/15  Admit with AMS, found down at home, ? narc contribution, intubated in ER  LINES/TUBES: ETT 6/15 >>   DISCUSSION: 72 year old male history of end-stage renal disease on hemodialysis via right PermCath, insulin-dependent diabetes  mellitus who was found down on a wellness check with vomitus in room and empty narcotic bottle belonging to friend.    ASSESSMENT / PLAN:  PULMONARY A: Intubated for Airway Protection  P:   PRVC 8 cc/kg  Wean PEEP / FiO2 for sats > 90% Follow intermittent CXR   CARDIOVASCULAR A:  Irregular rhtyhm - appears to be atrial fibrillation or type II mobitz. Regardless of exact rhythm, suspect this is due to his metabolic derangements  H/o HTN - BP in past notes range from 140's to 170's P:  ICU monitoring  Wean levophed for MAP > 65.  ? If we should lower MAP goals If rhythm persists, consider Cardiology consult  No anticoagulation for now Repeat EKG  RENAL A:   ESRD Hyperkalemia P:   Nephrology following, appreciate input  Trend BMP   Replace electrolytes as indicated  GASTROINTESTINAL A:   Reported vomiting - none since admit  P:   NPO / OGT  Begin trickle TF  Pepcid for SUP   HEMATOLOGIC A:   No active issues P: Trend CBC  Lovenox for DVT prophylaxis   INFECTIOUS A:   Leukopenia, encephalopathy Fever - vomiting on admit / none since, ? Bacteremia  P:   ABX as above Follow cultures  Consider d/c vanco 6/17   ENDOCRINE A:   Diabetes mellitus   P:   SSI  Hold home lantus   NEUROLOGIC A:   Acute Metabolic Encephalopathy - uremia, fever / infectious source P:   RASS goal: 0  Discontinue fentanyl gtt  PRN fentanyl for pain / sedation > want to see him alert before sedating him, ? Narcotic contribution on admit. May need repeat neuro imaging   Await EEG   FAMILY  - Updates:  No family at bedside, no contacts listed in chart.  - Inter-disciplinary family meet or Palliative Care meeting due by:  day 7   CC Time: 30 minutes   Canary BrimBrandi Kyrielle Urbanski, NP-C [facility] Pgr: [phone] or if no answer [phone] 07/13/2017, 10:29 AM

## 2017-07-13 NOTE — Progress Notes (Signed)
Text page to CC NP - than tried 2nd pager number - Dr Loran SentersKloefkorn  responded - update on phone says he will come and see the patient - Notified  of newest lab results. Text page to Dr Ronalee BeltsBhandari on call for nephrology with latest labs and pt condition. He stated  he will make a call to DR Marisue HumbleSanford who saw the patient today and make recommendations and place any orders needed.

## 2017-07-13 NOTE — Progress Notes (Signed)
Temp re-check. Pt temp at 103.1 F orally. Applied ice packs for cooling. RN aware.

## 2017-07-13 NOTE — Progress Notes (Signed)
HD tx completed w/ bp and cath function issues throughout tx, cath function is horrible and needs to be addressed. Possibly get a new cath, bp only stayed stable enough to run tx b/c primary team added Levophed drip, UF goal of keep even met, blood rinsed back VSS on Levo drip

## 2017-07-14 ENCOUNTER — Inpatient Hospital Stay (HOSPITAL_COMMUNITY): Payer: Medicare Other

## 2017-07-14 DIAGNOSIS — A419 Sepsis, unspecified organism: Principal | ICD-10-CM

## 2017-07-14 DIAGNOSIS — J9602 Acute respiratory failure with hypercapnia: Secondary | ICD-10-CM

## 2017-07-14 DIAGNOSIS — R6521 Severe sepsis with septic shock: Secondary | ICD-10-CM

## 2017-07-14 DIAGNOSIS — R4182 Altered mental status, unspecified: Secondary | ICD-10-CM

## 2017-07-14 LAB — RENAL FUNCTION PANEL
Albumin: 1.8 g/dL — ABNORMAL LOW (ref 3.5–5.0)
Albumin: 1.9 g/dL — ABNORMAL LOW (ref 3.5–5.0)
Anion gap: 17 — ABNORMAL HIGH (ref 5–15)
Anion gap: 18 — ABNORMAL HIGH (ref 5–15)
BUN: 55 mg/dL — ABNORMAL HIGH (ref 6–20)
BUN: 56 mg/dL — AB (ref 6–20)
CALCIUM: 8.7 mg/dL — AB (ref 8.9–10.3)
CALCIUM: 8.8 mg/dL — AB (ref 8.9–10.3)
CHLORIDE: 93 mmol/L — AB (ref 101–111)
CHLORIDE: 95 mmol/L — AB (ref 101–111)
CO2: 23 mmol/L (ref 22–32)
CO2: 22 mmol/L (ref 22–32)
CREATININE: 8.25 mg/dL — AB (ref 0.61–1.24)
Creatinine, Ser: 9.06 mg/dL — ABNORMAL HIGH (ref 0.61–1.24)
GFR calc Af Amer: 6 mL/min — ABNORMAL LOW (ref >60)
GFR calc non Af Amer: 6 mL/min — ABNORMAL LOW (ref >60)
GFR, EST AFRICAN AMERICAN: 7 mL/min — AB (ref >60)
GFR, EST NON AFRICAN AMERICAN: 5 mL/min — AB (ref >60)
GLUCOSE: 122 mg/dL — AB (ref 65–99)
Glucose, Bld: 123 mg/dL — ABNORMAL HIGH (ref 65–99)
Phosphorus: 9.9 mg/dL — ABNORMAL HIGH (ref 2.5–4.6)
Phosphorus: 7 mg/dL — ABNORMAL HIGH (ref 2.5–4.6)
Potassium: 5.4 mmol/L — ABNORMAL HIGH (ref 3.5–5.1)
Potassium: 5 mmol/L (ref 3.5–5.1)
SODIUM: 133 mmol/L — AB (ref 135–145)
SODIUM: 135 mmol/L (ref 135–145)

## 2017-07-14 LAB — APTT: APTT: 28 s (ref 24–36)

## 2017-07-14 LAB — POCT ACTIVATED CLOTTING TIME
ACTIVATED CLOTTING TIME: 180 s
ACTIVATED CLOTTING TIME: 191 s
Activated Clotting Time: 180 seconds
Activated Clotting Time: 180 seconds
Activated Clotting Time: 186 seconds

## 2017-07-14 LAB — VANCOMYCIN, RANDOM: Vancomycin Rm: 16

## 2017-07-14 LAB — GLUCOSE, CAPILLARY
GLUCOSE-CAPILLARY: 128 mg/dL — AB (ref 65–99)
GLUCOSE-CAPILLARY: 83 mg/dL (ref 65–99)
Glucose-Capillary: 124 mg/dL — ABNORMAL HIGH (ref 65–99)
Glucose-Capillary: 125 mg/dL — ABNORMAL HIGH (ref 65–99)
Glucose-Capillary: 89 mg/dL (ref 65–99)
Glucose-Capillary: 91 mg/dL (ref 65–99)

## 2017-07-14 LAB — LACTIC ACID, PLASMA
LACTIC ACID, VENOUS: 2.1 mmol/L — AB (ref 0.5–1.9)
Lactic Acid, Venous: 5.4 mmol/L (ref 0.5–1.9)
Lactic Acid, Venous: 3.9 mmol/L (ref 0.5–1.9)

## 2017-07-14 LAB — PROCALCITONIN: Procalcitonin: 89.97 ng/mL

## 2017-07-14 LAB — CBC
HEMATOCRIT: 28.4 % — AB (ref 39.0–52.0)
Hemoglobin: 9.6 g/dL — ABNORMAL LOW (ref 13.0–17.0)
MCH: 26.7 pg (ref 26.0–34.0)
MCHC: 33.8 g/dL (ref 30.0–36.0)
MCV: 79.1 fL (ref 78.0–100.0)
PLATELETS: 179 10*3/uL (ref 150–400)
RBC: 3.59 MIL/uL — ABNORMAL LOW (ref 4.22–5.81)
RDW: 15.9 % — AB (ref 11.5–15.5)
WBC: 13.5 10*3/uL — AB (ref 4.0–10.5)

## 2017-07-14 LAB — ECHOCARDIOGRAM COMPLETE
Height: 67 in
WEIGHTICAEL: 3012.37 [oz_av]

## 2017-07-14 MED ORDER — ALTEPLASE 2 MG IJ SOLR
2.0000 mg | Freq: Once | INTRAMUSCULAR | Status: AC
  Administered 2017-07-14: 1.7 mg
  Filled 2017-07-14: qty 2

## 2017-07-14 MED ORDER — PIPERACILLIN-TAZOBACTAM 3.375 G IVPB 30 MIN
3.3750 g | Freq: Four times a day (QID) | INTRAVENOUS | Status: DC
  Administered 2017-07-14 – 2017-07-15 (×4): 3.375 g via INTRAVENOUS
  Filled 2017-07-14 (×5): qty 50

## 2017-07-14 MED ORDER — HEPARIN SODIUM (PORCINE) 5000 UNIT/ML IJ SOLN
5000.0000 [IU] | Freq: Three times a day (TID) | INTRAMUSCULAR | Status: DC
  Administered 2017-07-14 – 2017-07-17 (×10): 5000 [IU] via SUBCUTANEOUS
  Filled 2017-07-14 (×10): qty 1

## 2017-07-14 MED ORDER — VANCOMYCIN HCL IN DEXTROSE 1-5 GM/200ML-% IV SOLN
1000.0000 mg | INTRAVENOUS | Status: DC
  Administered 2017-07-14 – 2017-07-15 (×2): 1000 mg via INTRAVENOUS
  Filled 2017-07-14 (×2): qty 200

## 2017-07-14 NOTE — Progress Notes (Signed)
Pt's CRRT initated 2135, pt's CRRT alarming high access pressures. Good blood return noted. 0045 pt's access pressure are high, at this time the access port no longer has blood return, notifed IV team. The access port is now in the process of having alteplase placed in line.

## 2017-07-14 NOTE — Progress Notes (Signed)
EKG CRITICAL VALUE     12 lead EKG performed.  Critical value noted.  AV Block 2nd degree,  April Lewis, RN notified.   Abbe AmsterdamJessica L Zebediah Beezley, CCT 07/14/2017 7:59 AM

## 2017-07-14 NOTE — Progress Notes (Signed)
Patient went into aflutter with EKG confirming, CCM aware, no orders at this time. [name] Dudley, [name], RN 07/14/2017 3:22 PM

## 2017-07-14 NOTE — Progress Notes (Signed)
CRITICAL VALUE ALERT  Critical Value: Lactic acid 5.4  Date & Time Notied: 07/14/17 0135  Provider Notified: Jovita KussmaulKatalina EuBanks NP  Orders Received/Actions taken: No new orders given at this time

## 2017-07-14 NOTE — Progress Notes (Addendum)
Initial Nutrition Assessment  DOCUMENTATION CODES:   Not applicable  INTERVENTION:   When able to resume TF, recommend:   Vital AF 1.2 at 60 ml/h (1440 ml per day)  Pro-stat 30 ml once daily  Provides 1828 kcal, 123 gm protein, 1168 ml free water daily  NUTRITION DIAGNOSIS:   Inadequate oral intake related to inability to eat as evidenced by NPO status.  GOAL:   Patient will meet greater than or equal to 90% of their needs  MONITOR:   Vent status, Labs, TF tolerance, I & O's  REASON FOR ASSESSMENT:   Ventilator, Consult Enteral/tube feeding initiation and management  ASSESSMENT:   72 yo male with PMH of HTN, DM, neuropathy, and ESRD-HD who was admitted on 6/15 after being found down unresponsive at home with vomitus throughout his home. Required intubation on admission.   Discussed patient with RN today. CRRT was started on admission, but had to be stopped this morning due to malfunctioning permcath. Needs permcath replacement.   Trickle TF with Vital High Protein at 20 ml/h was initiated yesterday, but has been on hold since vomiting once this morning.  Patient is currently intubated on ventilator support MV: 14.9 L/min Temp (24hrs), Avg:98.4 F (36.9 C), Min:97.7 F (36.5 C), Max:99.2 F (37.3 C)  Labs reviewed. Sodium 133 (L), potassium 5.4 (H), phosphorus 9.9 (H) CBG's: 89-125 Medications reviewed. Weight is 125% of weight from one month ago; 151 lbs --> 188 lbs. Weight changes likely at least partially related to volume status.    NUTRITION - FOCUSED PHYSICAL EXAM:    Most Recent Value  Orbital Region  No depletion  Upper Arm Region  No depletion  Thoracic and Lumbar Region  Unable to assess  Buccal Region  Unable to assess  Temple Region  No depletion  Clavicle Bone Region  No depletion  Clavicle and Acromion Bone Region  No depletion  Scapular Bone Region  Unable to assess  Dorsal Hand  Unable to assess  Patellar Region  No depletion  Anterior  Thigh Region  No depletion  Posterior Calf Region  No depletion  Edema (RD Assessment)  Mild  Hair  Reviewed  Eyes  Unable to assess  Mouth  Unable to assess  Skin  Reviewed  Nails  Unable to assess       Diet Order:   Diet Order           Diet NPO time specified  Diet effective now          EDUCATION NEEDS:   No education needs have been identified at this time  Skin:  Skin Assessment: Reviewed RN Assessment  Last BM:  6/17  Height:   Ht Readings from Last 1 Encounters:  December 26, 2017 5\' 7"  (1.702 m)    Weight:   Wt Readings from Last 1 Encounters:  07/14/17 188 lb 4.4 oz (85.4 kg)    Ideal Body Weight:  67.3 kg  BMI:  Body mass index is 29.49 kg/m.  Estimated Nutritional Needs:   Kcal:  1855  Protein:  120-140 gm (when on CRRT)  Fluid:  1.5 L    Joaquin CourtsKimberly Harris, RD, LDN, CNSC Pager [phone] After Hours Pager [phone]

## 2017-07-14 NOTE — Procedures (Signed)
ELECTROENCEPHALOGRAM REPORT  Date of Study: 07/14/2017  Patient's Name: [name] Dudley MRN: [id number] Date of Birth: 10/13/1945  Referring Provider: Jovita KussmaulKatalina Eubanks, NP  Clinical History: This is a 72 year old man with altered mental status  Medications: No sedating medications listed  Technical Summary: A multichannel digital EEG recording measured by the international 10-20 system with electrodes applied with paste and impedances below 5000 ohms performed as portable with EKG monitoring in an intubated and unresponsive patient.  Hyperventilation and photic stimulation were not performed.  The digital EEG was referentially recorded, reformatted, and digitally filtered in a variety of bipolar and referential montages for optimal display.   Description: The patient is intubated and unresponsive during the recording, no sedating medications listed. There is no clear posterior dominant rhythm seen.  The background consists of a large amount of diffuse 4-5 Hz theta and 2-3 Hz delta slowing with triphasic waves occurring every 1-2 seconds without evolution in frequency or amplitude. Normal sleep architecture is not seen. There is an increase in muscle artifact with noxious stimulation. Hyperventilation and photic stimulation were not performed. There were no epileptiform discharges or electrographic seizures seen.    EKG lead was unremarkable.  Impression: This EEG is abnormal due to the presence of moderate diffuse background slowing with triphasic waves  Clinical Correlation of the above findings indicates diffuse cerebral dysfunction that is non-specific in etiology and can be seen with hypoxic/ischemic injury, toxic/metabolic encephalopathies, neurodegenerative disorders, or medication effect. Triphasic waves are typically seen with hepatic encephalopathy, but may be seen with other metabolic encephalopathies as well. The absence of epileptiform discharges does not rule out a clinical  diagnosis of epilepsy.  Clinical correlation is advised.   Patrcia DollyKaren Anthea Udovich, M.D.

## 2017-07-14 NOTE — Progress Notes (Signed)
PULMONARY / CRITICAL CARE MEDICINE   Name: [name] Dudley MRN: [id number] DOB: [birth year]    ADMISSION DATE:  07/27/2017 CONSULTATION DATE:  07/13/2017  REFERRING MD:  Meridee Score, MD  CHIEF COMPLAINT:  Unresponsive, found down  HISTORY OF PRESENT ILLNESS:   72 year old male history of end-stage renal disease on hemodialysis via right PermCath, insulin-dependent diabetes mellitus who was found down on a wellness check.  No family or friends are present at the time of my examination, however the patient was reportedly last seen normal on Wednesday and per report was not complaining of any sick symptoms at that time.  Police found him unresponsive with vomitus throughout his home and when EMS arrived he was noted to have pinpoint pupils.  There is also report of an empty pill bottle that actually belonged to his friend who normally gives him rides to dialysis, they stated that the name started with hydro-and they believed it to be a narcotic.  Patient was bag mask ventilated in route to the emergency department.  On arrival he was intubated for airway protection.  He was noted to be hyperkalemic for which he received insulin, dextrose, calcium.  SUBJECTIVE:   Afebrile overnight.  Having trouble with catheter for CRRT  Off levophed   VITAL SIGNS: BP 118/67   Pulse 72   Temp 99.1 F (37.3 C) (Axillary)   Resp (!) 25   Ht 5\' 7"  (1.702 m)   Wt 85.4 kg (188 lb 4.4 oz)   SpO2 100%   BMI 29.49 kg/m   HEMODYNAMICS:    VENTILATOR SETTINGS: Vent Mode: PRVC FiO2 (%):  [40 %] 40 % Set Rate:  [18 bmp] 18 bmp Vt Set:  [620 mL] 620 mL PEEP:  [5 cmH20] 5 cmH20 Plateau Pressure:  [19 cmH20-32 cmH20] 23 cmH20  INTAKE / OUTPUT: I/O last 3 completed shifts: In: 2441.3 [I.V.:1417.2; NG/GT:320; IV Piggyback:704.1] Out: 459.9 [Urine:22; Emesis/NG output:250; Other:187.9]  PHYSICAL EXAMINATION: General: chronically ill appearing male, NAD on vent  HEENT:mm moist, ETT, no JVD  Neuro: opens  eyes, moves around in bed, coughs, does not follow commands  CV: s1s2 rrr PULM: resps even non labored on vent, failed SBT r/t poor Vt, tachypnea, scattered rhonchi  WU:JWJX, non-tender, bsx4 active  Extremities: warm and dry, no sig edema, LUE fistula +thrill/bruit  Skin: no rashes or lesions  LABS:  BMET Recent Labs  Lab 07/13/17 0905 07/13/17 1522 07/14/17 0628  NA 133* 126* 133*  K 4.9 5.7* 5.4*  CL 89* 86* 93*  CO2 23 19* 23  BUN 50* 55* 55*  CREATININE 9.31* 9.42* 9.06*  GLUCOSE 129* 359* 123*    Electrolytes Recent Labs  Lab 07/13/17 0905 07/13/17 1522 07/14/17 0628  CALCIUM 8.9 8.1* 8.7*  MG 1.7  --   --   PHOS 7.4*  --  9.9*    CBC Recent Labs  Lab 07/15/2017 1726 07/24/2017 1729 07/13/17 0905 07/14/17 0628  WBC 3.7*  --  11.8* 13.5*  HGB 11.8* 11.6* 11.4* 9.6*  HCT 36.7* 34.0* 35.5* 28.4*  PLT 267  --  205 179    Coag's Recent Labs  Lab 07/23/2017 1726 07/14/17 0628  APTT  --  28  INR 1.28  --     Sepsis Markers Recent Labs  Lab 07/06/2017 2150 07/13/17 0905 07/14/17 0040 07/14/17 0452 07/14/17 0628  LATICACIDVEN 2.1* 5.3* 5.4* 3.9* 2.1*  PROCALCITON 46.63 85.17  --   --  89.97    ABG Recent Labs  Lab 07/01/2017  1812  PHART 7.366  PCO2ART 39.6  PO2ART 152.0*    Liver Enzymes Recent Labs  Lab 2017-07-04 1726 07/14/17 0628  AST 45*  --   ALT 15*  --   ALKPHOS 85  --   BILITOT 0.7  --   ALBUMIN 2.7* 1.8*    Cardiac Enzymes Recent Labs  Lab 07/13/17 1522  TROPONINI 0.30*    Glucose Recent Labs  Lab 07/13/17 1226 07/13/17 1551 07/13/17 1914 07/14/17 0005 07/14/17 0405 07/14/17 0745  GLUCAP 97 160* 151* 124* 83 89    Imaging No results found.   STUDIES:  6/15  CTH >> small vessel ischemic disease, no acute abnormality  6/15  CXR >> interstitial edema, decreased from prior 6/16  EEG >>   CULTURES: BCx2 6/15 >> Tracheal aspirate 6/15 >>abundant GNC, mod GPC>>>  ANTIBIOTICS: Vancomycin 6/15  >>  SIGNIFICANT EVENTS: 6/15  Admit with AMS, found down at home, ? narc contribution, intubated in ER  LINES/TUBES: ETT 6/15 >>   DISCUSSION: 72 year old male history of end-stage renal disease on hemodialysis via right PermCath, insulin-dependent diabetes mellitus who was found down on a wellness check with vomitus in room and empty narcotic bottle belonging to friend.    ASSESSMENT / PLAN:  PULMONARY A: Acute respiratory failure  ??aspiration PNA P:   Vent support - 8cc/kg  F/u CXR 6/18 F/u ABG Wean PEEP / FiO2 for sats > 90% Empiric abx as above  Follow cultures  Daily SBT - failed this am  No extubation until mental status sig improved   CARDIOVASCULAR A:  Irregular rhtyhm - appears to be atrial fibrillation or type II mobitz. Suspect r/t metabolic derangements.  Improving.  H/o HTN -  P:  Tele  Off pressors  No anticoagulation for now EKG PRN   RENAL A:   ESRD with uremia  Hyperkalemia HD Catheter malfunction  P:   Nephrology following, appreciate input  Trend BMP   Replace electrolytes as indicated ??if we can use his fistula for iHD now that he is off pressors.  Or consider IR to replace perm-cath if his fistula is not mature.    GASTROINTESTINAL A:   Reported vomiting - none since admit  P:   NPO / OGT  Continue trickle TF  Pepcid for SUP   HEMATOLOGIC A:   No active issues P: Trend CBC  SQ heparin   INFECTIOUS A:   Leukopenia, encephalopathy Fever -- improved.  But pct continues to uptrend  P:   ABX as above Follow cultures  Consider d/c vanco 6/18  ENDOCRINE A:   Diabetes mellitus   P:   SSI  Hold home lantus   NEUROLOGIC A:   Acute Metabolic Encephalopathy - uremia, fever / infectious source P:   RASS goal: 0  Off sedation currently  PRN fentanyl for pain / sedation  May need repeat neuro imaging   EEG done - awaiting interpretation   FAMILY  - Updates:  No family at bedside 6/17.  Pt has NO contacts listed in  chart.  Discussed with social work, they will attempt to locate family.   - Inter-disciplinary family meet or Palliative Care meeting due by:  day 7   Dirk DressKaty Kourtnei Rauber, NP 07/14/2017  11:20 AM Pager: [phone] or [phone]

## 2017-07-14 NOTE — Progress Notes (Signed)
Informed CCM of access pressures that are extremely negative despite flushing both ports, flipping access and return, TPA of access port on nightshift, changing 3 filters, and changing machines. Will continue to monitor closely. Huntley EstelleLewis, Eilyn Polack E, RN 07/14/2017 9:49 AM

## 2017-07-14 NOTE — Progress Notes (Signed)
[facility] ROUNDING NOTE   Subjective:   72 year old male history of end-stage renal disease on hemodialysis via right PermCath, insulin-dependent diabetes mellitus who was found down on a wellness check. Police found him unresponsive with vomitus throughout his home and when EMS arrived he was noted to have pinpoint pupils.  There is also report of an empty pill bottle that actually belonged to his friend who normally gives him rides to dialysis, they stated that the name started with hydro-and they believed it to be a narcotic. In the emergency room the patient was found to be hyperkalemic and ws started on CRRT       Objective:  Vital signs in last 24 hours:  Temp:  [97.7 F (36.5 C)-102.2 F (39 C)] 98.5 F (36.9 C) (06/17 1147) Pulse Rate:  [58-117] 73 (06/17 1139) Resp:  [19-25] 19 (06/17 1139) BP: (80-182)/(35-90) 160/87 (06/17 1139) SpO2:  [88 %-100 %] 100 % (06/17 1139) FiO2 (%):  [40 %] 40 % (06/17 1139) Weight:  [187 lb 9.8 oz (85.1 kg)-188 lb 4.4 oz (85.4 kg)] 188 lb 4.4 oz (85.4 kg) (06/17 0500)  Weight change: 1 lb 5.2 oz (0.6 kg) Filed Weights   07/13/17 0500 07/13/17 2000 07/14/17 0500  Weight: 187 lb 2.7 oz (84.9 kg) 187 lb 9.8 oz (85.1 kg) 188 lb 4.4 oz (85.4 kg)    Intake/Output: I/O last 3 completed shifts: In: 2441.3 [I.V.:1417.2; NG/GT:320; IV Piggyback:704.1] Out: 459.9 [Urine:22; Emesis/NG output:250; Other:187.9]   Intake/Output this shift:  Total I/O In: 354.4 [P.O.:20; I.V.:7.9; NG/GT:89; IV Piggyback:237.5] Out: 34 [Other:56]  ETT  CVS- RRR   RS- CTA mechanical breath sounds on vent  ABD- BS present soft non-distended EXT- no edema LUE thrill     Basic Metabolic Panel: Recent Labs  Lab 07/27/2017 1726 07/07/2017 1729 07/04/2017 2150 07/13/17 0905 07/13/17 1522 07/14/17 0628  NA 137 134* 143 133* 126* 133*  K 7.5* 7.4* 5.9* 4.9 5.7* 5.4*  CL 93* 101 93* 89* 86* 93*  CO2 20*  --  26 23 19* 23  GLUCOSE 114* 114* 102* 129*  359* 123*  BUN 102* 91* 105* 50* 55* 55*  CREATININE 16.45* 15.70* 16.38* 9.31* 9.42* 9.06*  CALCIUM 9.3  --  9.0 8.9 8.1* 8.7*  MG  --   --   --  1.7  --   --   PHOS  --   --   --  7.4*  --  9.9*    Liver Function Tests: Recent Labs  Lab 06/29/2017 1726 07/14/17 0628  AST 45*  --   ALT 15*  --   ALKPHOS 85  --   BILITOT 0.7  --   PROT 8.8*  --   ALBUMIN 2.7* 1.8*   Recent Labs  Lab 07/14/2017 2035  LIPASE 26   No results for input(s): AMMONIA in the last 168 hours.  CBC: Recent Labs  Lab 07/20/2017 1726 07/06/2017 1729 07/13/17 0905 07/14/17 0628  WBC 3.7*  --  11.8* 13.5*  NEUTROABS 2.1  --   --   --   HGB 11.8* 11.6* 11.4* 9.6*  HCT 36.7* 34.0* 35.5* 28.4*  MCV 82.1  --  83.1 79.1  PLT 267  --  205 179    Cardiac Enzymes: Recent Labs  Lab 06/30/2017 2150 07/13/17 1522  CKTOTAL 3,519*  --   TROPONINI  --  0.30*    BNP: Invalid input(s): POCBNP  CBG: Recent Labs  Lab 07/13/17 1914 07/14/17 0005 07/14/17 0405  07/14/17 0745 07/14/17 1142  GLUCAP 151* 124* 83 89 125*    Microbiology: Results for orders placed or performed during the hospital encounter of 07/15/2017  Culture, respiratory (tracheal aspirate)     Status: None (Preliminary result)   Collection Time: 07/10/2017  7:50 PM  Result Value Ref Range Status   Specimen Description TRACHEAL ASPIRATE  Final   Special Requests NONE  Final   Gram Stain   Final    ABUNDANT WBC PRESENT, PREDOMINANTLY PMN ABUNDANT GRAM NEGATIVE COCCOBACILLI MODERATE GRAM POSITIVE COCCI    Culture   Final    CULTURE REINCUBATED FOR BETTER GROWTH Performed at [facility], [place] [address], [place], [place] [address]    Report Status PENDING  Incomplete  Culture, blood (routine x 2)     Status: None (Preliminary result)   Collection Time: 07/09/2017  8:20 PM  Result Value Ref Range Status   Specimen Description BLOOD PORTA CATH  Final   Special Requests   Final    BOTTLES DRAWN AEROBIC AND ANAEROBIC Blood Culture  adequate volume   Culture   Final    NO GROWTH < 24 HOURS Performed at [facility], [place] [address], [place], [place] [address]    Report Status PENDING  Incomplete  Culture, blood (routine x 2)     Status: None (Preliminary result)   Collection Time: 07/17/2017  8:35 PM  Result Value Ref Range Status   Specimen Description BLOOD RIGHT ANTECUBITAL  Final   Special Requests   Final    BOTTLES DRAWN AEROBIC AND ANAEROBIC Blood Culture adequate volume   Culture   Final    NO GROWTH < 24 HOURS Performed at [facility], [place] [address] [place], [place] [address]    Report Status PENDING  Incomplete  MRSA PCR Screening     Status: None   Collection Time: 07/01/2017  9:14 PM  Result Value Ref Range Status   MRSA by PCR NEGATIVE NEGATIVE Final    Comment:        The GeneXpert MRSA Assay (FDA approved for NASAL specimens only), is one component of a comprehensive MRSA colonization surveillance program. It is not intended to diagnose MRSA infection nor to guide or monitor treatment for MRSA infections. Performed at [facility], [place] [address] [place],  [address]     Coagulation Studies: Recent Labs    06/29/2017 1726  LABPROT 15.8*  INR 1.28    Urinalysis: Recent Labs    07/27/2017 1850  COLORURINE YELLOW  LABSPEC 1.011  PHURINE 7.0  GLUCOSEU 150*  HGBUR SMALL*  BILIRUBINUR NEGATIVE  KETONESUR NEGATIVE  PROTEINUR 100*  NITRITE NEGATIVE  LEUKOCYTESUR NEGATIVE      Imaging: Ct Head Wo Contrast  Result Date: 07/01/2017 CLINICAL DATA:  Altered level of consciousness unexplained. EXAM: CT HEAD WITHOUT CONTRAST TECHNIQUE: Contiguous axial images were obtained from the base of the skull through the vertex without intravenous contrast. COMPARISON:  None. FINDINGS: Brain: Mild age related involutional changes of the brain with chronic appearing small vessel ischemic disease of periventricular white matter. No large vascular territory infarct, hemorrhage  or midline shift. No effacement of the basal cisterns or fourth ventricle. No intra-axial mass nor extra-axial fluid collections. Vascular: No hyperdense vessel or unexpected calcification. Skull: Negative for fracture or focal osseous lesions. Sinuses/Orbits: No acute finding. Other: Partially included endotracheal and gastric tubes are noted. 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 No acute intracranial abnormality. Electronically Signed  By: Ashley Royalty M.D.   On: 07/10/2017 19:07   Dg Chest Port 1 View  Result Date: 07/09/2017 CLINICAL DATA:  Intubation.  Respiratory arrest. EXAM: PORTABLE CHEST 1 VIEW COMPARISON:  12/05/2016 FINDINGS: Endotracheal tube terminates 3.4 cm above carina.Nasogastric tube extends beyond the inferior aspect of the film. Right-sided dialysis catheter tip at high right atrium. Midline trachea. Normal heart size for level of inspiration. Atherosclerosis in the transverse aorta. No right and no definite left pleural effusion. Improved mild pulmonary venous congestion, as evidenced by perihilar interstitial prominence. Mild left base airspace disease is similar. IMPRESSION: Mild pulmonary venous congestion, improved since the prior. Left base airspace disease is unchanged and favored to represent atelectasis. Appropriate position of endotracheal tube. Aortic Atherosclerosis (ICD10-I70.0). Electronically Signed   By: Abigail Miyamoto M.D.   On: 07/23/2017 17:38     Medications:   . sodium chloride Stopped (07/13/17 2115)  . albumin human    . famotidine (PEPCID) IV Stopped (07/13/17 2300)  . heparin 10,000 units/ 20 mL infusion syringe 450 Units/hr (07/14/17 0815)  . piperacillin-tazobactam    . dialysis replacement fluid (prismasate) 400 mL/hr at 07/14/17 0650  . dialysis replacement fluid (prismasate) 400 mL/hr at 07/14/17 0649  . dialysate (PRISMASATE) 2,000 mL/hr at 07/14/17 0916  . sodium chloride    . vancomycin Stopped (07/14/17 1008)   .  chlorhexidine gluconate (MEDLINE KIT)  15 mL Mouth Rinse BID  . feeding supplement (VITAL HIGH PROTEIN)  1,000 mL Per Tube Q24H  . heparin injection (subcutaneous)  5,000 Units Subcutaneous Q8H  . insulin aspart  0-9 Units Subcutaneous Q4H  . mouth rinse  15 mL Mouth Rinse 10 times per day   sodium chloride, acetaminophen (TYLENOL) oral liquid 160 mg/5 mL, albumin human, docusate, fentaNYL (SUBLIMAZE) injection, fentaNYL (SUBLIMAZE) injection, heparin, heparin, sodium chloride  Assessment/ Plan:   1. End stage renal disease now on CRRT treatment  permcath not functioning well despite thrombolytics   Will need permcath replacement  2. Acute respiratory Failure   Empiric antibiotics  Vent support 3. Catheter malfunction    Will need perm cath replacement per CCM  4. Diabetes stable    LOS: 2 [name] Dudley W '@TODAY''@12'$ :13 PM

## 2017-07-14 NOTE — Care Management (Addendum)
CM informed transfer coordinator of admit. [facility]  CM informed [facility] Maggie Southerland of admit - no further [facility] requested [facility] at this time. CM will continue to follow for discharge needs

## 2017-07-14 NOTE — Progress Notes (Addendum)
11:59am- CSW received call back from [employer] Douglas informing CSW that he was able to speak with neighbors regarding pt. Neighbors expressed that all of pt's family [place]. CSW was informed that Officer Riley LamDouglas was informed that pt has someone that comes once a week to help assist pt but neighbors are no sure who this person is. CSW was informed that officer has provided neighbors with CSW conatct information in the case that more information is gathered.   CSW spoke with Social Worker from [phone] Center and was informed that they do not have any family on file for pt at either. CSW still waiting for call back from [facility] social workers at this time.   11:37am- CSW has [facility]  [facility] and left VM for her to call CSW back.   11:31am- CSW left VM for Delila PereyraBertina Duncan with [facility] asking that she call CSW back.   CSW verbally consulted by staff as staff has been looking for family. CSW informed that pt is intubated and unable to speak with CSW for further information needed. CSW has reached out to [employer] to see if family is able to be located at the address on file for pt at this time. CSW has also [facility] for further leads to family as pt is set up with this service per chart.   Claude MangesKierra S. Kaylene Dawn, MSW, LCSW-A Emergency Department Clinical Social Worker [phone]

## 2017-07-14 NOTE — Progress Notes (Signed)
  Echocardiogram 2D Echocardiogram has been performed.  [name] ChimesWendy  Derian Dudley 07/14/2017, 4:11 PM

## 2017-07-14 NOTE — Progress Notes (Signed)
EEG Completed; Results Pending  

## 2017-07-15 ENCOUNTER — Inpatient Hospital Stay (HOSPITAL_COMMUNITY): Payer: Medicare Other

## 2017-07-15 DIAGNOSIS — J9602 Acute respiratory failure with hypercapnia: Secondary | ICD-10-CM

## 2017-07-15 LAB — CBC
HCT: 28.2 % — ABNORMAL LOW (ref 39.0–52.0)
HEMOGLOBIN: 9.6 g/dL — AB (ref 13.0–17.0)
MCH: 26.4 pg (ref 26.0–34.0)
MCHC: 34 g/dL (ref 30.0–36.0)
MCV: 77.7 fL — ABNORMAL LOW (ref 78.0–100.0)
PLATELETS: 168 10*3/uL (ref 150–400)
RBC: 3.63 MIL/uL — AB (ref 4.22–5.81)
RDW: 15.9 % — ABNORMAL HIGH (ref 11.5–15.5)
WBC: 15 10*3/uL — ABNORMAL HIGH (ref 4.0–10.5)

## 2017-07-15 LAB — RENAL FUNCTION PANEL
ALBUMIN: 2 g/dL — AB (ref 3.5–5.0)
ANION GAP: 16 — AB (ref 5–15)
ANION GAP: 18 — AB (ref 5–15)
Albumin: 2 g/dL — ABNORMAL LOW (ref 3.5–5.0)
BUN: 43 mg/dL — ABNORMAL HIGH (ref 6–20)
BUN: 22 mg/dL — ABNORMAL HIGH (ref 6–20)
CALCIUM: 9 mg/dL (ref 8.9–10.3)
CALCIUM: 9 mg/dL (ref 8.9–10.3)
CO2: 24 mmol/L (ref 22–32)
CO2: 23 mmol/L (ref 22–32)
CREATININE: 5.62 mg/dL — AB (ref 0.61–1.24)
Chloride: 98 mmol/L — ABNORMAL LOW (ref 101–111)
Chloride: 97 mmol/L — ABNORMAL LOW (ref 101–111)
Creatinine, Ser: 3.62 mg/dL — ABNORMAL HIGH (ref 0.61–1.24)
GFR calc Af Amer: 18 mL/min — ABNORMAL LOW (ref >60)
GFR calc non Af Amer: 15 mL/min — ABNORMAL LOW (ref >60)
GFR, EST AFRICAN AMERICAN: 11 mL/min — AB (ref >60)
GFR, EST NON AFRICAN AMERICAN: 9 mL/min — AB (ref >60)
GLUCOSE: 97 mg/dL (ref 65–99)
Glucose, Bld: 132 mg/dL — ABNORMAL HIGH (ref 65–99)
PHOSPHORUS: 4.1 mg/dL (ref 2.5–4.6)
Phosphorus: 3.5 mg/dL (ref 2.5–4.6)
Potassium: 3.7 mmol/L (ref 3.5–5.1)
Potassium: 3.8 mmol/L (ref 3.5–5.1)
SODIUM: 138 mmol/L (ref 135–145)
SODIUM: 138 mmol/L (ref 135–145)

## 2017-07-15 LAB — GLUCOSE, CAPILLARY
GLUCOSE-CAPILLARY: 119 mg/dL — AB (ref 65–99)
GLUCOSE-CAPILLARY: 197 mg/dL — AB (ref 65–99)
GLUCOSE-CAPILLARY: 151 mg/dL — AB (ref 65–99)
GLUCOSE-CAPILLARY: 118 mg/dL — AB (ref 65–99)
GLUCOSE-CAPILLARY: 62 mg/dL — AB (ref 65–99)
Glucose-Capillary: 140 mg/dL — ABNORMAL HIGH (ref 65–99)
Glucose-Capillary: 99 mg/dL (ref 65–99)
Glucose-Capillary: 58 mg/dL — ABNORMAL LOW (ref 65–99)

## 2017-07-15 LAB — MAGNESIUM: MAGNESIUM: 2.1 mg/dL (ref 1.7–2.4)

## 2017-07-15 LAB — CULTURE, RESPIRATORY

## 2017-07-15 LAB — CULTURE, RESPIRATORY W GRAM STAIN

## 2017-07-15 LAB — AMMONIA: Ammonia: 23 umol/L (ref 9–35)

## 2017-07-15 MED ORDER — SODIUM CHLORIDE 0.9 % IV SOLN
1.0000 g | INTRAVENOUS | Status: AC
  Administered 2017-07-16 – 2017-07-21 (×6): 1 g via INTRAVENOUS
  Filled 2017-07-15 (×6): qty 10

## 2017-07-15 MED ORDER — DEXTROSE 50 % IV SOLN
INTRAVENOUS | Status: AC
  Administered 2017-07-15: 50 mL
  Filled 2017-07-15: qty 50

## 2017-07-15 MED ORDER — LIDOCAINE-PRILOCAINE 2.5-2.5 % EX CREA
1.0000 "application " | TOPICAL_CREAM | CUTANEOUS | Status: DC | PRN
  Filled 2017-07-15: qty 5

## 2017-07-15 MED ORDER — SODIUM CHLORIDE 0.9 % IV SOLN: 100.0000 mL | INTRAVENOUS | Status: DC | PRN

## 2017-07-15 MED ORDER — PIPERACILLIN-TAZOBACTAM 3.375 G IVPB
3.3750 g | Freq: Two times a day (BID) | INTRAVENOUS | Status: DC
  Administered 2017-07-15: 3.375 g via INTRAVENOUS
  Filled 2017-07-15: qty 50

## 2017-07-15 MED ORDER — ALTEPLASE 2 MG IJ SOLR
2.0000 mg | Freq: Once | INTRAMUSCULAR | Status: DC | PRN
  Filled 2017-07-15: qty 2

## 2017-07-15 MED ORDER — VANCOMYCIN HCL IN DEXTROSE 1-5 GM/200ML-% IV SOLN: 1000.0000 mg | INTRAVENOUS | Status: DC

## 2017-07-15 MED ORDER — PENTAFLUOROPROP-TETRAFLUOROETH EX AERO: 1.0000 "application " | INHALATION_SPRAY | CUTANEOUS | Status: DC | PRN

## 2017-07-15 MED ORDER — CHLORHEXIDINE GLUCONATE CLOTH 2 % EX PADS
6.0000 | MEDICATED_PAD | Freq: Every day | CUTANEOUS | Status: DC
  Administered 2017-07-16 – 2017-07-21 (×2): 6 via TOPICAL

## 2017-07-15 MED ORDER — HEPARIN SODIUM (PORCINE) 1000 UNIT/ML DIALYSIS: 1000.0000 [IU] | INTRAMUSCULAR | Status: DC | PRN

## 2017-07-15 MED ORDER — LACTULOSE ENEMA
300.0000 mL | Freq: Two times a day (BID) | ORAL | Status: DC
  Administered 2017-07-15 – 2017-07-16 (×2): 300 mL via RECTAL
  Filled 2017-07-15 (×2): qty 300

## 2017-07-15 MED ORDER — CEFTRIAXONE SODIUM 1 G IJ SOLR: 2.0000 g | INTRAMUSCULAR | Status: DC

## 2017-07-15 MED ORDER — LIDOCAINE HCL (PF) 1 % IJ SOLN: 5.0000 mL | INTRAMUSCULAR | Status: DC | PRN

## 2017-07-15 NOTE — Progress Notes (Addendum)
PULMONARY / CRITICAL CARE MEDICINE   Name: [name] Dudley MRN: [id number] DOB: [birth year]    ADMISSION DATE:  07/25/2017 CONSULTATION DATE:  07/04/2017  REFERRING MD:  Meridee Score, MD  CHIEF COMPLAINT:  Unresponsive, found down  HISTORY OF PRESENT ILLNESS:   72 year old male history of end-stage renal disease on hemodialysis via right PermCath, insulin-dependent diabetes mellitus who was found down on a wellness check.  No family or friends are present at the time of my examination, however the patient was reportedly last seen normal on Wednesday and per report was not complaining of any sick symptoms at that time.  Police found him unresponsive with vomitus throughout his home and when EMS arrived he was noted to have pinpoint pupils.  There is also report of an empty pill bottle that actually belonged to his friend who normally gives him rides to dialysis, they stated that the name started with hydro-and they believed it to be a narcotic.  Patient was bag mask ventilated in route to the emergency department.  On arrival he was intubated for airway protection.  He was noted to be hyperkalemic for which he received insulin, dextrose, calcium.  SUBJECTIVE:   Temp Spike of 102.2 Leukocytosis CRRT stopped 6/17 am>> will try HD through fistula 6/18 Remains Off levophed  Off all sedation and WD to pain, grimacing to sternal rub. + Cough/Gag>> weaning well on 5/5  VITAL SIGNS: BP (!) 101/52   Pulse 84   Temp 98.7 F (37.1 C) (Oral)   Resp (!) 21   Ht 5\' 7"  (1.702 m)   Wt 187 lb 9.8 oz (85.1 kg)   SpO2 100%   BMI 29.38 kg/m   HEMODYNAMICS:    VENTILATOR SETTINGS: Vent Mode: PSV;CPAP FiO2 (%):  [40 %] 40 % Set Rate:  [18 bmp] 18 bmp Vt Set:  [620 mL] 620 mL PEEP:  [5 cmH20] 5 cmH20 Pressure Support:  [5 cmH20] 5 cmH20 Plateau Pressure:  [17 cmH20-23 cmH20] 20 cmH20  INTAKE / OUTPUT: I/O last 3 completed shifts: In: 1262.7 [P.O.:20; I.V.:305.8; NG/GT:380.3; IV  Piggyback:556.6] Out: 238.9 [Other:238.9]  PHYSICAL EXAMINATION: General: chronically ill appearing male, NAD on vent , unresponsive HEENT:mm moist, ETT, no JVD  Neuro: does not open eyes, +  cough, does not follow commands , grimacing to sternal rub CV: s1s2 rrr, No RMG PULM: resps even non labored on vent, tolerating  SBT 6/18 am,  scattered rhonchi throughout WU:JWJX, non-tender, bsx4 active, obese  Extremities: warm and dry, no sig edema, LUE fistula +thrill/bruit  Skin: no rashes or lesions, warm and dry  LABS:  BMET Recent Labs  Lab 07/13/17 1522 07/14/17 0628 07/14/17 1514  NA 126* 133* 135  K 5.7* 5.4* 5.0  CL 86* 93* 95*  CO2 19* 23 22  BUN 55* 55* 56*  CREATININE 9.42* 9.06* 8.25*  GLUCOSE 359* 123* 122*    Electrolytes Recent Labs  Lab 07/13/17 0905 07/13/17 1522 07/14/17 0628 07/14/17 1514  CALCIUM 8.9 8.1* 8.7* 8.8*  MG 1.7  --   --   --   PHOS 7.4*  --  9.9* 7.0*    CBC Recent Labs  Lab 07/06/2017 1726 07/05/2017 1729 07/13/17 0905 07/14/17 0628  WBC 3.7*  --  11.8* 13.5*  HGB 11.8* 11.6* 11.4* 9.6*  HCT 36.7* 34.0* 35.5* 28.4*  PLT 267  --  205 179    Coag's Recent Labs  Lab 07/04/2017 1726 07/14/17 0628  APTT  --  28  INR 1.28  --  Sepsis Markers Recent Labs  Lab Sep 15, 2017 2150 07/13/17 0905 07/14/17 0040 07/14/17 0452 07/14/17 0628  LATICACIDVEN 2.1* 5.3* 5.4* 3.9* 2.1*  PROCALCITON 46.63 85.17  --   --  89.97    ABG Recent Labs  Lab Sep 15, 2017 1812  PHART 7.366  PCO2ART 39.6  PO2ART 152.0*    Liver Enzymes Recent Labs  Lab Sep 15, 2017 1726 07/14/17 0628 07/14/17 1514  AST 45*  --   --   ALT 15*  --   --   ALKPHOS 85  --   --   BILITOT 0.7  --   --   ALBUMIN 2.7* 1.8* 1.9*    Cardiac Enzymes Recent Labs  Lab 07/13/17 1522  TROPONINI 0.30*    Glucose Recent Labs  Lab 07/14/17 1142 07/14/17 1518 07/14/17 2028 07/14/17 2332 07/15/17 0343 07/15/17 0749  GLUCAP 125* 91 119* 128* 140* 197*     Imaging Dg Chest Portable 1 View  Result Date: 07/15/2017 CLINICAL DATA:  Respiratory failure.  Shortness of breath. EXAM: PORTABLE CHEST 1 VIEW COMPARISON:  06/15/2017. FINDINGS: Endotracheal tube, NG tube, right IJ sheath in stable position. Heart size stable. Progression of left base atelectasis/infiltrate. Small left pleural effusion. New onset right base subsegmental atelectasis. No pneumothorax. IMPRESSION: 1.  Lines and tubes in stable position. 2. Progression of left base atelectasis/infiltrate. Small left pleural effusion. New right base subsegmental atelectasis. Electronically Signed   By: Maisie Fushomas  Register   On: 07/15/2017 06:27     STUDIES:  6/15  CTH >> small vessel ischemic disease, no acute abnormality  6/15  CXR >> interstitial edema, decreased from prior 6/16  EEG >> abnormal due to the presence of moderate diffuse background slowing with triphasic waves. This is non-specific in etiology and can be seen with hypoxic/ischemic injury, toxic/metabolic encephalopathies, neurodegenerative disorders, or medication effect.       CULTURES: BCx2 6/15 >> Tracheal aspirate 6/15 >>abundant GNC, mod GPC>>> H Flu, Beta Lactamase Negative  ANTIBIOTICS: Vancomycin 6/15 >> Zosyn 6/16 >>   SIGNIFICANT EVENTS: 6/15  Admit with AMS, found down at home, ? narc contribution, intubated in ER  LINES/TUBES: ETT 6/15 >>   DISCUSSION: 72 year old male history of end-stage renal disease on hemodialysis via right PermCath, insulin-dependent diabetes mellitus who was found down on a wellness check with vomitus in room and empty narcotic bottle belonging to friend.    ASSESSMENT / PLAN:  PULMONARY A: Acute respiratory failure  ??aspiration PNA 6/18>>CXR with progression of left base infiltrate/ atelectasis, small L pleural effusion, New R base atelectasis Febrile P:   Vent support - 8cc/kg  F/u CXR 6/19 Trend ABG Wean PEEP / FiO2 for sats > 90% Empiric abx as above  Follow  cultures  Daily SBT - doing well 6/18 No extubation until mental status sig improved   CARDIOVASCULAR A:  Irregular rhtyhm - appears to be atrial fibrillation or type II mobitz. Suspect r/t metabolic derangements.  Improving.  H/o HTN -  Echo EF 55%,  P:  Tele  Off pressors  No anticoagulation for now EKG PRN  Consider cards consult  RENAL A:   ESRD with uremia  Hyperkalemia HD Catheter malfunction  P:   Nephrology following, appreciate input  Trend BMP   Replace electrolytes as indicated Will attempt to use his fistula for iHD now that he is off pressors.( For HD 6/18) Will need perm-cath if his fistula is not mature.    GASTROINTESTINAL A:   Reported vomiting - none since admit  + BM  x 2 last 24 P:   NPO / OGT  Continue trickle TF  Pepcid for SUP   HEMATOLOGIC A:   No active issues P: Trend CBC  Moitor for active bleeding SQ heparin   INFECTIOUS A:   Leukopenia, encephalopathy Fever -- spike over night   pct continues to uptrend  P:   ABX as above Follow cultures  Will continue Vanc for now  in light of fever and elevated WBC   ENDOCRINE A:   Diabetes mellitus   P: CBG Q 4   SSI  Hold home lantus   NEUROLOGIC A:   Acute Metabolic Encephalopathy - uremia, fever / infectious source P:   RASS goal: 0  Off sedation currently  PRN fentanyl for pain / sedation  Consider  repeat neuro imaging if no continued improvement  EEG as above   FAMILY  - Updates:  No family at bedside 6/18. Friends were in room visiting. Family are in contact with patient's friends, but remain out of town in [place], and [place].  - Inter-disciplinary family meet or Palliative Care meeting due by Day 7   Bevelyn Ngo, AGACNP-BC 07/15/2017  10:37 AM Pager:  [phone]

## 2017-07-15 NOTE — Progress Notes (Signed)
Pharmacy Antibiotic Note  [name] Dudley is a 72 y.o. male admitted on 07/24/2017 with sepsis.  Pharmacy originally consulted for Vancomycin and Zosyn dosing.  Noted PCN allergy listed as N/V, rash but listed as low-grade. Appears to be tolerating without issues.    Currently on D#3 of vancomycin/Zosyn for sepsis. Trach aspirate grew out H. Influenzae (B-lactamase neg). Pt spiked fever overnight, CXR worse, WBC 11.8>13.5  Patient was transitioned to CRRT on 6/16 but will not attempt HD today.   Plan: Continue vancomycin and zosyn given worsening chest x ray and fever spike Adjust vancomycin to 1 gm IV Q HD - received dose this AM. Hold any further doses today  Adjust Zosyn to 3.375 gm IV Q 12 hours. De-escalate tomorrow if afebrile  - Will continue to follow renal function, culture results, LOT, and antibiotic de-escalation plans   Height: 5\' 7"  (170.2 cm) Weight: 187 lb 9.8 oz (85.1 kg) IBW/kg (Calculated) : 66.1  Temp (24hrs), Avg:99.7 F (37.6 C), Min:98.5 F (36.9 C), Max:102.2 F (39 C)  Recent Labs  Lab 07/24/2017 1726  07/19/2017 2150 07/13/17 0905 07/13/17 1522 07/14/17 0040 07/14/17 0452 07/14/17 0628 07/14/17 1514  WBC 3.7*  --   --  11.8*  --   --   --  13.5*  --   CREATININE 16.45*   < > 16.38* 9.31* 9.42*  --   --  9.06* 8.25*  LATICACIDVEN  --    < > 2.1* 5.3*  --  5.4* 3.9* 2.1*  --   VANCORANDOM  --   --   --   --   --   --   --  16  --    < > = values in this interval not displayed.    Estimated Creatinine Clearance: 8.4 mL/min (A) (by C-G formula based on SCr of 8.25 mg/dL (H)).    Allergies  Allergen Reactions  . Penicillins Nausea And Vomiting and Rash    Has patient had a PCN reaction causing immediate rash, facial/tongue/throat swelling, SOB or lightheadedness with hypotension: No Has patient had a PCN reaction causing severe rash involving mucus membranes or skin necrosis: No Has patient had a PCN reaction that required hospitalization: No Has  patient had a PCN reaction occurring within the last 10 years: No If all of the above answers are "NO", then may proceed with Cephalosporin use.     Vanc 6/15>> Zosyn 6/16>>  6/15 MRSA - NEG 6/15 TA - H. Flu (B-lactamase neg)  6/15 BCx>>pending   Thank you for allowing pharmacy to be a part of this patient's care.  Vinnie LevelBenjamin Alean Kromer, PharmD., BCPS Clinical Pharmacist Clinical phone for 07/15/17 until 3:30pm: [phone] If after 3:30pm, please call main pharmacy at: [phone]

## 2017-07-15 NOTE — Progress Notes (Signed)
[facility] ROUNDING NOTE   Subjective:   72 year old male history of end-stage renal disease ( started Nov 2018) on hemodialysis via right PermCath, insulin-dependent diabetes mellitus who was found down on a wellness check. Police found him unresponsive with vomitus throughout his home and when EMS arrived he was noted to have pinpoint pupils.  There is also report of an empty pill bottle that actually belonged to his friend who normally gives him rides to dialysis, they stated that the name started with hydro-and they believed it to be a narcotic. In the emergency room the patient was found to be hyperkalemic and ws started on CRRT   The permcath has had poor function during CRRT and clotted multiple times 6/17 despite use of TPA and heparin in the circuit   It was decided to transition the patient to intermittent hemodialysis particularly as the patient is now hemodynamically stable and is no longer requiring pressors. EEG  findings indicates diffuse cerebral dysfunction that is non-specific in etiology and can be seen with hypoxic/ischemic injury, toxic/metabolic encephalopathies, neurodegenerative disorders, or medication effect.   He still remains intubated at this time      Objective:  Vital signs in last 24 hours:  Temp:  [98.5 F (36.9 C)-102.2 F (39 C)] 100.4 F (38 C) (06/18 0500) Pulse Rate:  [62-117] 70 (06/18 0600) Resp:  [18-25] 22 (06/18 0335) BP: (80-172)/(40-90) 110/54 (06/18 0600) SpO2:  [88 %-100 %] 100 % (06/18 0600) FiO2 (%):  [40 %] 40 % (06/18 0335) Weight:  [187 lb 9.8 oz (85.1 kg)] 187 lb 9.8 oz (85.1 kg) (06/18 0338)  Weight change: 0 lb (0 kg) Filed Weights   07/13/17 2000 07/14/17 0500 07/15/17 0338  Weight: 187 lb 9.8 oz (85.1 kg) 188 lb 4.4 oz (85.4 kg) 187 lb 9.8 oz (85.1 kg)    Intake/Output: I/O last 3 completed shifts: In: 1926.3 [P.O.:20; I.V.:989.4; NG/GT:460.3; IV Piggyback:456.6] Out: 288.9 [Emesis/NG output:50;  Other:238.9]   Intake/Output this shift:  Total I/O In: 160 [I.V.:20; NG/GT:40; IV Piggyback:100] Out: -   ETT  CVS- RRR   RS- CTA mechanical breath sounds on vent  ABD- BS present soft non-distended EXT- no edema LUE thrill  AVF    Basic Metabolic Panel: Recent Labs  Lab 07/11/2017 2150 07/13/17 0905 07/13/17 1522 07/14/17 0628 07/14/17 1514  NA 143 133* 126* 133* 135  K 5.9* 4.9 5.7* 5.4* 5.0  CL 93* 89* 86* 93* 95*  CO2 26 23 19* 23 22  GLUCOSE 102* 129* 359* 123* 122*  BUN 105* 50* 55* 55* 56*  CREATININE 16.38* 9.31* 9.42* 9.06* 8.25*  CALCIUM 9.0 8.9 8.1* 8.7* 8.8*  MG  --  1.7  --   --   --   PHOS  --  7.4*  --  9.9* 7.0*    Liver Function Tests: Recent Labs  Lab 07/20/2017 1726 07/14/17 0628 07/14/17 1514  AST 45*  --   --   ALT 15*  --   --   ALKPHOS 85  --   --   BILITOT 0.7  --   --   PROT 8.8*  --   --   ALBUMIN 2.7* 1.8* 1.9*   Recent Labs  Lab 07/18/2017 2035  LIPASE 26   No results for input(s): AMMONIA in the last 168 hours.  CBC: Recent Labs  Lab 07/07/2017 1726 07/17/2017 1729 07/13/17 0905 07/14/17 0628  WBC 3.7*  --  11.8* 13.5*  NEUTROABS 2.1  --   --   --  HGB 11.8* 11.6* 11.4* 9.6*  HCT 36.7* 34.0* 35.5* 28.4*  MCV 82.1  --  83.1 79.1  PLT 267  --  205 179    Cardiac Enzymes: Recent Labs  Lab 07/25/2017 2150 07/13/17 1522  CKTOTAL 3,519*  --   TROPONINI  --  0.30*    BNP: Invalid input(s): POCBNP  CBG: Recent Labs  Lab 07/14/17 0745 07/14/17 1142 07/14/17 1518 07/14/17 2332 07/15/17 0343  GLUCAP 89 125* 91 128* 140*    Microbiology: Results for orders placed or performed during the hospital encounter of 07/09/2017  Culture, respiratory (tracheal aspirate)     Status: None (Preliminary result)   Collection Time: 07/02/2017  7:50 PM  Result Value Ref Range Status   Specimen Description TRACHEAL ASPIRATE  Final   Special Requests NONE  Final   Gram Stain   Final    ABUNDANT WBC PRESENT, PREDOMINANTLY  PMN ABUNDANT GRAM NEGATIVE COCCOBACILLI MODERATE GRAM POSITIVE COCCI    Culture   Final    ABUNDANT HAEMOPHILUS INFLUENZAE BETA LACTAMASE NEGATIVE Performed at [facility], [place] [address] [place], [place] [address]    Report Status PENDING  Incomplete  Culture, blood (routine x 2)     Status: None (Preliminary result)   Collection Time: 07/05/2017  8:20 PM  Result Value Ref Range Status   Specimen Description BLOOD PORTA CATH  Final   Special Requests   Final    BOTTLES DRAWN AEROBIC AND ANAEROBIC Blood Culture adequate volume   Culture   Final    NO GROWTH 2 DAYS Performed at [facility], [place] [address], [place], [place] [address]    Report Status PENDING  Incomplete  Culture, blood (routine x 2)     Status: None (Preliminary result)   Collection Time: 07/25/2017  8:35 PM  Result Value Ref Range Status   Specimen Description BLOOD RIGHT ANTECUBITAL  Final   Special Requests   Final    BOTTLES DRAWN AEROBIC AND ANAEROBIC Blood Culture adequate volume   Culture   Final    NO GROWTH 2 DAYS Performed at [facility], [place] [address] [place], [place] [address]    Report Status PENDING  Incomplete  MRSA PCR Screening     Status: None   Collection Time: 07/16/2017  9:14 PM  Result Value Ref Range Status   MRSA by PCR NEGATIVE NEGATIVE Final    Comment:        The GeneXpert MRSA Assay (FDA approved for NASAL specimens only), is one component of a comprehensive MRSA colonization surveillance program. It is not intended to diagnose MRSA infection nor to guide or monitor treatment for MRSA infections. Performed at [facility], [place] [address] [place], [place] [address]     Coagulation Studies: Recent Labs    06/28/2017 1726  LABPROT 15.8*  INR 1.28    Urinalysis: Recent Labs    07/05/2017 1850  COLORURINE YELLOW  LABSPEC 1.011  PHURINE 7.0  GLUCOSEU 150*  HGBUR SMALL*  BILIRUBINUR NEGATIVE  KETONESUR NEGATIVE  PROTEINUR 100*  NITRITE  NEGATIVE  LEUKOCYTESUR NEGATIVE      Imaging: No results found.   Medications:   . sodium chloride Stopped (07/13/17 2115)  . albumin human    . famotidine (PEPCID) IV Stopped (07/15/17 0011)  . heparin 10,000 units/ 20 mL infusion syringe 450 Units/hr (07/14/17 0815)  . piperacillin-tazobactam 3.375 g (07/15/17 0601)  . dialysis replacement fluid (prismasate) 400 mL/hr at 07/14/17 0650  .  dialysis replacement fluid (prismasate) 400 mL/hr at 07/14/17 0649  . dialysate (PRISMASATE) 2,000 mL/hr at 07/14/17 0916  . sodium chloride    . vancomycin Stopped (07/14/17 1008)   . chlorhexidine gluconate (MEDLINE KIT)  15 mL Mouth Rinse BID  . feeding supplement (VITAL HIGH PROTEIN)  1,000 mL Per Tube Q24H  . heparin injection (subcutaneous)  5,000 Units Subcutaneous Q8H  . insulin aspart  0-9 Units Subcutaneous Q4H  . mouth rinse  15 mL Mouth Rinse 10 times per day   sodium chloride, acetaminophen (TYLENOL) oral liquid 160 mg/5 mL, albumin human, docusate, fentaNYL (SUBLIMAZE) injection, fentaNYL (SUBLIMAZE) injection, heparin, heparin, sodium chloride  Assessment/ Plan:   1. End stage renal disease  Will plan dialysis today ,may attempt cannulation of AVF  2. Acute respiratory Failure   Treated with vanc and zosyn Vent support 3. Catheter malfunction    Will attempt cannulation of fistula  Placed 3/19  Underwent multisegment angioplasty 5/17  ( Dr Oneida Alar)  4. Diabetes stable    LOS: 3 Zamyia Gowell W '@TODAY''@6'$ :55 AM

## 2017-07-15 NOTE — Progress Notes (Signed)
CSW spoke with Seward GraterMaggie Southerland [phone]  [facility] CSW sought further information from her on who staff can contact for pt as no numbers were listed in chart as of yesterday for pt. CSW observed today that pt now has contacts listed in chart (sister, friend, and daughter). CSW informed Seward GraterMaggie of this. CSW was informed by Seward GraterMaggie that pt is 100 percent service connected with [facility] which means [facility] and [facility] to [facility] through the [facility] and their contracted facilties.   CSW will continue to follow for further needs at this time. And will assess pt once pt is no longer intubated and alert.   Claude MangesKierra S. Kaylob Wallen, MSW, LCSW-A Emergency Department Clinical Social Worker [phone]

## 2017-07-15 NOTE — Progress Notes (Signed)
PT weaned for 8 hours on PS 5/ PEEP +5. Placed back on previous settings d/t sustained high RR and increased agitation while receiving HD. RN aware.

## 2017-07-16 ENCOUNTER — Inpatient Hospital Stay (HOSPITAL_COMMUNITY): Payer: Medicare Other

## 2017-07-16 LAB — PHOSPHORUS
Phosphorus: 7.1 mg/dL — ABNORMAL HIGH (ref 2.5–4.6)
Phosphorus: 7.4 mg/dL — ABNORMAL HIGH (ref 2.5–4.6)

## 2017-07-16 LAB — CBC
HCT: 28 % — ABNORMAL LOW (ref 39.0–52.0)
HEMOGLOBIN: 9.2 g/dL — AB (ref 13.0–17.0)
MCH: 26.6 pg (ref 26.0–34.0)
MCHC: 32.9 g/dL (ref 30.0–36.0)
MCV: 80.9 fL (ref 78.0–100.0)
Platelets: 158 10*3/uL (ref 150–400)
RBC: 3.46 MIL/uL — AB (ref 4.22–5.81)
RDW: 16.6 % — ABNORMAL HIGH (ref 11.5–15.5)
WBC: 17.4 10*3/uL — ABNORMAL HIGH (ref 4.0–10.5)

## 2017-07-16 LAB — GLUCOSE, CAPILLARY
GLUCOSE-CAPILLARY: 160 mg/dL — AB (ref 65–99)
GLUCOSE-CAPILLARY: 144 mg/dL — AB (ref 65–99)
GLUCOSE-CAPILLARY: 147 mg/dL — AB (ref 65–99)
GLUCOSE-CAPILLARY: 256 mg/dL — AB (ref 65–99)
Glucose-Capillary: 130 mg/dL — ABNORMAL HIGH (ref 65–99)
Glucose-Capillary: 149 mg/dL — ABNORMAL HIGH (ref 65–99)
Glucose-Capillary: 154 mg/dL — ABNORMAL HIGH (ref 65–99)
Glucose-Capillary: 257 mg/dL — ABNORMAL HIGH (ref 65–99)

## 2017-07-16 LAB — RENAL FUNCTION PANEL
ANION GAP: 19 — AB (ref 5–15)
ANION GAP: 19 — AB (ref 5–15)
Albumin: 1.8 g/dL — ABNORMAL LOW (ref 3.5–5.0)
Albumin: 1.8 g/dL — ABNORMAL LOW (ref 3.5–5.0)
BUN: 43 mg/dL — ABNORMAL HIGH (ref 6–20)
BUN: 57 mg/dL — ABNORMAL HIGH (ref 6–20)
CALCIUM: 8.6 mg/dL — AB (ref 8.9–10.3)
CALCIUM: 9.2 mg/dL (ref 8.9–10.3)
CO2: 21 mmol/L — AB (ref 22–32)
CO2: 22 mmol/L (ref 22–32)
Chloride: 98 mmol/L — ABNORMAL LOW (ref 101–111)
Chloride: 97 mmol/L — ABNORMAL LOW (ref 101–111)
Creatinine, Ser: 5.63 mg/dL — ABNORMAL HIGH (ref 0.61–1.24)
Creatinine, Ser: 6.63 mg/dL — ABNORMAL HIGH (ref 0.61–1.24)
GFR calc non Af Amer: 9 mL/min — ABNORMAL LOW (ref >60)
GFR calc non Af Amer: 7 mL/min — ABNORMAL LOW (ref >60)
GFR, EST AFRICAN AMERICAN: 10 mL/min — AB (ref >60)
GFR, EST AFRICAN AMERICAN: 9 mL/min — AB (ref >60)
Glucose, Bld: 164 mg/dL — ABNORMAL HIGH (ref 65–99)
Glucose, Bld: 195 mg/dL — ABNORMAL HIGH (ref 65–99)
PHOSPHORUS: 6.6 mg/dL — AB (ref 2.5–4.6)
PHOSPHORUS: 7.4 mg/dL — AB (ref 2.5–4.6)
Potassium: 4.3 mmol/L (ref 3.5–5.1)
Potassium: 3.9 mmol/L (ref 3.5–5.1)
SODIUM: 138 mmol/L (ref 135–145)
Sodium: 138 mmol/L (ref 135–145)

## 2017-07-16 LAB — PROCALCITONIN: PROCALCITONIN: 71.75 ng/mL

## 2017-07-16 LAB — MAGNESIUM
MAGNESIUM: 2.2 mg/dL (ref 1.7–2.4)
MAGNESIUM: 2.3 mg/dL (ref 1.7–2.4)
Magnesium: 2.1 mg/dL (ref 1.7–2.4)

## 2017-07-16 LAB — APTT: APTT: 51 s — AB (ref 24–36)

## 2017-07-16 MED ORDER — PRO-STAT SUGAR FREE PO LIQD: 30.0000 mL | Freq: Two times a day (BID) | ORAL | Status: DC

## 2017-07-16 MED ORDER — VITAL HIGH PROTEIN PO LIQD: 1000.0000 mL | ORAL | Status: DC

## 2017-07-16 MED ORDER — VITAL AF 1.2 CAL PO LIQD
1000.0000 mL | ORAL | Status: DC
  Administered 2017-07-16 – 2017-07-19 (×4): 1000 mL

## 2017-07-16 MED ORDER — LACTULOSE 10 GM/15ML PO SOLN
30.0000 g | Freq: Three times a day (TID) | ORAL | Status: DC
Start: 2017-07-16 — End: 2017-07-19
  Administered 2017-07-16 – 2017-07-17 (×4): 30 g via ORAL
  Filled 2017-07-16 (×9): qty 45

## 2017-07-16 MED ORDER — VITAL HIGH PROTEIN PO LIQD
1000.0000 mL | ORAL | Status: DC
  Administered 2017-07-16: 1000 mL

## 2017-07-16 MED ORDER — CHLORHEXIDINE GLUCONATE CLOTH 2 % EX PADS
6.0000 | MEDICATED_PAD | Freq: Every day | CUTANEOUS | Status: DC
  Administered 2017-07-17 – 2017-07-19 (×4): 6 via TOPICAL

## 2017-07-16 MED ORDER — PRO-STAT SUGAR FREE PO LIQD
30.0000 mL | Freq: Every day | ORAL | Status: DC
  Administered 2017-07-17 – 2017-07-21 (×4): 30 mL
  Filled 2017-07-16 (×4): qty 30

## 2017-07-16 MED ORDER — PRO-STAT SUGAR FREE PO LIQD
30.0000 mL | Freq: Two times a day (BID) | ORAL | Status: DC
  Administered 2017-07-16: 30 mL
  Filled 2017-07-16: qty 30

## 2017-07-16 NOTE — Progress Notes (Signed)
Nutrition Follow-up / Consult  DOCUMENTATION CODES:   Not applicable  INTERVENTION:    Vital AF 1.2 at 60 ml/h (1440 ml per day)  Pro-stat 30 ml once daily  Provides 1828 kcal, 123 gm protein, 1168 ml free water daily  NUTRITION DIAGNOSIS:   Inadequate oral intake related to inability to eat as evidenced by NPO status.  Ongoing  GOAL:   Patient will meet greater than or equal to 90% of their needs  Unmet  MONITOR:   Vent status, Labs, TF tolerance, I & O's  REASON FOR ASSESSMENT:   Consult Enteral/tube feeding initiation and management  ASSESSMENT:   72 yo male with PMH of HTN, DM, neuropathy, and ESRD-HD who was admitted on 6/15 after being found down unresponsive at home with vomitus throughout his home. Required intubation on admission.  Discussed patient in ICU rounds and with RN today. S/P HD yesterday. Plans for HD again tomorrow. Not currently on CRRT.  Received MD Consult for TF initiation and management. Patient is currently intubated on ventilator support MV: 14.3 L/min Temp (24hrs), Avg:98.8 F (37.1 C), Min:98 F (36.7 C), Max:100.6 F (38.1 C)  Propofol: none Labs reviewed. Phosphorus 6.6 (H), potassium 4.3 (WNL) CBG's: [phone] Medications reviewed and include novolog, lactulose, pepcid, albumin.   Diet Order:   Diet Order           Diet NPO time specified  Diet effective now          EDUCATION NEEDS:   No education needs have been identified at this time  Skin:  Skin Assessment: Reviewed RN Assessment  Last BM:  6/19  Height:   Ht Readings from Last 1 Encounters:  04-26-17 5\' 7"  (1.702 m)    Weight:   Wt Readings from Last 1 Encounters:  07/16/17 182 lb 1.6 oz (82.6 kg)   07/14/17 188 lb 4.4 oz (85.4 kg)    Ideal Body Weight:  67.3 kg  BMI:  Body mass index is 28.52 kg/m.  Estimated Nutritional Needs:   Kcal:  1890  Protein:  120-140 gm  Fluid:  1.5 L    Joaquin CourtsKimberly Harris, RD, LDN, CNSC Pager  [phone] After Hours Pager [phone]

## 2017-07-16 NOTE — Progress Notes (Signed)
[facility] ROUNDING NOTE   Subjective:   72 year old male history of end-stage renal disease ( started Nov 2018) on hemodialysis via right PermCath, insulin-dependent diabetes mellitus who was found down on a wellness check. Police found him unresponsive with vomitus throughout his home and when EMS arrived he was noted to have pinpoint pupils.  There is also report of an empty pill bottle that actually belonged to his friend who normally gives him rides to dialysis, they stated that the name started with hydro-and they believed it to be a narcotic. In the emergency room the patient was found to be hyperkalemic and ws started on CRRT   The permcath has had poor function during CRRT and clotted multiple times 6/17 despite use of TPA and heparin in the circuit   It was decided to transition the patient to intermittent hemodialysis particularly as the patient is now hemodynamically stable and is no longer requiring pressors. EEG  findings indicates diffuse cerebral dysfunction that is non-specific in etiology and can be seen with hypoxic/ischemic injury, toxic/metabolic encephalopathies, neurodegenerative disorders, or medication effect.  Remains intubated. Attempts are being made to keep friends and family informed. Decisions about goals of care would be appropriate at this stage      Objective:  Vital signs in last 24 hours:  Temp:  [98 F (36.7 C)-100.6 F (38.1 C)] 98.1 F (36.7 C) (06/19 0810) Pulse Rate:  [69-111] 80 (06/19 0738) Resp:  [14-36] 28 (06/19 0738) BP: (76-162)/(37-97) 122/68 (06/19 0738) SpO2:  [84 %-100 %] 100 % (06/19 0738) FiO2 (%):  [40 %] 40 % (06/19 0738) Weight:  [182 lb 1.6 oz (82.6 kg)-188 lb 0.8 oz (85.3 kg)] 182 lb 1.6 oz (82.6 kg) (06/19 0500)  Weight change: 7.1 oz (0.2 kg) Filed Weights   07/15/17 1330 07/15/17 1753 07/16/17 0500  Weight: 188 lb 0.8 oz (85.3 kg) 184 lb 8.4 oz (83.7 kg) 182 lb 1.6 oz (82.6 kg)    Intake/Output: I/O last 3  completed shifts: In: 860.1 [I.V.:208.9; NG/GT:265; IV Piggyback:386.2] Out: 0174 [Urine:3; BSWHQ:7591]   Intake/Output this shift:  No intake/output data recorded.  ETT  CVS- RRR   RS- CTA mechanical breath sounds on vent  ABD- BS present soft non-distended EXT- no edema LUE thrill  AVF    Basic Metabolic Panel: Recent Labs  Lab 07/13/17 0905  07/14/17 0628 07/14/17 1514 07/15/17 1157 07/15/17 1735 07/16/17 0454  NA 133*   < > 133* 135 138 138 138  K 4.9   < > 5.4* 5.0 3.7 3.8 4.3  CL 89*   < > 93* 95* 98* 97* 98*  CO2 23   < > _0 21*  GLUCOSE 129*   < > 123* 122* 132* 97 164*  BUN 50*   < > 55* 56* 43* 22* 43*  CREATININE 9.31*   < > 9.06* 8.25* 5.62* 3.62* 5.63*  CALCIUM 8.9   < > 8.7* 8.8* 9.0 9.0 8.6*  MG 1.7  --   --   --  2.1  --  2.1  PHOS 7.4*  --  9.9* 7.0* 4.1 3.5 6.6*   < > = values in this interval not displayed.    Liver Function Tests: Recent Labs  Lab 07/06/2017 1726 07/14/17 0628 07/14/17 1514 07/15/17 1157 07/15/17 1735 07/16/17 0454  AST 45*  --   --   --   --   --   ALT 15*  --   --   --   --   --  ALKPHOS 85  --   --   --   --   --   BILITOT 0.7  --   --   --   --   --   PROT 8.8*  --   --   --   --   --   ALBUMIN 2.7* 1.8* 1.9* 2.0* 2.0* 1.8*   Recent Labs  Lab 06/29/2017 2035  LIPASE 26   Recent Labs  Lab 07/15/17 1952  AMMONIA 23    CBC: Recent Labs  Lab 07/10/2017 1726 07/10/2017 1729 07/13/17 0905 07/14/17 0628 07/15/17 1157 07/16/17 0454  WBC 3.7*  --  11.8* 13.5* 15.0* 17.4*  NEUTROABS 2.1  --   --   --   --   --   HGB 11.8* 11.6* 11.4* 9.6* 9.6* 9.2*  HCT 36.7* 34.0* 35.5* 28.4* 28.2* 28.0*  MCV 82.1  --  83.1 79.1 77.7* 80.9  PLT 267  --  205 179 168 158    Cardiac Enzymes: Recent Labs  Lab 07/01/2017 2150 07/13/17 1522  CKTOTAL 3,519*  --   TROPONINI  --  0.30*    BNP: Invalid input(s): POCBNP  CBG: Recent Labs  Lab 07/15/17 2047 07/15/17 2106 07/16/17 0009 07/16/17 0355 07/16/17 0754   GLUCAP 62* 118* 130* 160* 144*    Microbiology: Results for orders placed or performed during the hospital encounter of 06/28/2017  Culture, respiratory (tracheal aspirate)     Status: None   Collection Time: 07/05/2017  7:50 PM  Result Value Ref Range Status   Specimen Description TRACHEAL ASPIRATE  Final   Special Requests NONE  Final   Gram Stain   Final    ABUNDANT WBC PRESENT, PREDOMINANTLY PMN ABUNDANT GRAM NEGATIVE COCCOBACILLI MODERATE GRAM POSITIVE COCCI    Culture   Final    ABUNDANT HAEMOPHILUS INFLUENZAE BETA LACTAMASE NEGATIVE Performed at [facility], [place] [address], [place], [place] [address]    Report Status 07/15/2017 FINAL  Final  Culture, blood (routine x 2)     Status: None (Preliminary result)   Collection Time: 06/29/2017  8:20 PM  Result Value Ref Range Status   Specimen Description BLOOD PORTA CATH  Final   Special Requests   Final    BOTTLES DRAWN AEROBIC AND ANAEROBIC Blood Culture adequate volume   Culture   Final    NO GROWTH 3 DAYS Performed at [facility], [place] [address], [place], [place] [address]    Report Status PENDING  Incomplete  Culture, blood (routine x 2)     Status: None (Preliminary result)   Collection Time: 07/16/2017  8:35 PM  Result Value Ref Range Status   Specimen Description BLOOD RIGHT ANTECUBITAL  Final   Special Requests   Final    BOTTLES DRAWN AEROBIC AND ANAEROBIC Blood Culture adequate volume   Culture   Final    NO GROWTH 3 DAYS Performed at [facility], [place] [address] [place], [place] [address]    Report Status PENDING  Incomplete  MRSA PCR Screening     Status: None   Collection Time: 07/15/2017  9:14 PM  Result Value Ref Range Status   MRSA by PCR NEGATIVE NEGATIVE Final    Comment:        The GeneXpert MRSA Assay (FDA approved for NASAL specimens only), is one component of a comprehensive MRSA colonization surveillance program. It is not intended to diagnose MRSA infection nor to guide  or monitor treatment for MRSA  infections. Performed at [facility], [place] [address], [place], [place] [address]     Coagulation Studies: No results for input(s): LABPROT, INR in the last 72 hours.  Urinalysis: No results for input(s): COLORURINE, LABSPEC, PHURINE, GLUCOSEU, HGBUR, BILIRUBINUR, KETONESUR, PROTEINUR, UROBILINOGEN, NITRITE, LEUKOCYTESUR in the last 72 hours.  Invalid input(s): APPERANCEUR    Imaging: Dg Chest Port 1 View  Result Date: 07/16/2017 CLINICAL DATA:  Hypoxia EXAM: PORTABLE CHEST 1 VIEW COMPARISON:  July 15, 2017 FINDINGS: Endotracheal tube tip is 3.5 cm above the carina. Central catheter tip is at the cavoatrial junction. Nasogastric tube tip and side port are below the diaphragm with side port seen in the proximal stomach. No pneumothorax. There is consolidation in the left lower lobe with small left pleural effusion. There is slight atelectasis in the left mid lung. Lungs elsewhere are clear. Heart is upper normal in size with pulmonary vascularity normal. No adenopathy. No evident bone lesions. IMPRESSION: Tube and catheter positions as described without pneumothorax. Consolidation left lower lobe, likely pneumonia, with small left pleural effusion. Mild left midlung atelectasis. Right lung clear. Stable cardiac silhouette. There is aortic atherosclerosis. Aortic Atherosclerosis (ICD10-I70.0). Electronically Signed   By: Lowella Grip III M.D.   On: 07/16/2017 07:34   Dg Chest Portable 1 View  Result Date: 07/15/2017 CLINICAL DATA:  Respiratory failure.  Shortness of breath. EXAM: PORTABLE CHEST 1 VIEW COMPARISON:  07/14/2017. FINDINGS: Endotracheal tube, NG tube, right IJ sheath in stable position. Heart size stable. Progression of left base atelectasis/infiltrate. Small left pleural effusion. New onset right base subsegmental atelectasis. No pneumothorax. IMPRESSION: 1.  Lines and tubes in stable position. 2. Progression of left base  atelectasis/infiltrate. Small left pleural effusion. New right base subsegmental atelectasis. Electronically Signed   By: Marcello Moores  Register   On: 07/15/2017 06:27   US Abdomen Limited Ruq  Result Date: 07/16/2017 CLINICAL DATA:  Acute encephalopathy. Query cirrhosis. History of renal insufficiency on dialysis. Hypertension. EXAM: ULTRASOUND ABDOMEN LIMITED RIGHT UPPER QUADRANT COMPARISON:  Ultrasound kidneys 12/14/2016 FINDINGS: Gallbladder: Cholelithiasis with 7 mm stone in the gallbladder. No gallbladder wall thickening or edema. Common bile duct: Diameter: 5.5 mm, normal Liver: Heterogeneous liver parenchymal echotexture pattern is most likely associated with fatty infiltration. No focal lesions identified. Portal vein is patent on color Doppler imaging with normal direction of blood flow towards the liver. Visualized right kidney demonstrates increased echotexture consistent with medical renal disease, similar to prior study. IMPRESSION: 1. Cholelithiasis without evidence of cholecystitis. 2. Heterogeneous liver parenchymal echotexture likely associated with geographic fatty infiltration. Electronically Signed   By: Lucienne Capers M.D.   On: 07/16/2017 01:28     Medications:   . sodium chloride Stopped (07/13/17 2115)  . sodium chloride    . sodium chloride    . albumin human    . cefTRIAXone (ROCEPHIN)  IV 1 g (07/16/17 0053)  . famotidine (PEPCID) IV 100 mL/hr at 07/16/17 0000  . heparin 10,000 units/ 20 mL infusion syringe 450 Units/hr (07/14/17 0815)  . dialysis replacement fluid (prismasate) 400 mL/hr at 07/14/17 0650  . dialysis replacement fluid (prismasate) 400 mL/hr at 07/14/17 0649  . dialysate (PRISMASATE) 2,000 mL/hr at 07/14/17 0916  . sodium chloride     . chlorhexidine gluconate (MEDLINE KIT)  15 mL Mouth Rinse BID  . Chlorhexidine Gluconate Cloth  6 each Topical Q0600  . feeding supplement (VITAL HIGH PROTEIN)  1,000 mL Per Tube Q24H  . heparin injection (subcutaneous)   5,000 Units Subcutaneous  Q8H  . insulin aspart  0-9 Units Subcutaneous Q4H  . lactulose  300 mL Rectal BID  . mouth rinse  15 mL Mouth Rinse 10 times per day   sodium chloride, sodium chloride, sodium chloride, acetaminophen (TYLENOL) oral liquid 160 mg/5 mL, albumin human, alteplase, docusate, fentaNYL (SUBLIMAZE) injection, fentaNYL (SUBLIMAZE) injection, heparin, heparin, heparin, lidocaine (PF), lidocaine-prilocaine, pentafluoroprop-tetrafluoroeth, sodium chloride  Assessment/ Plan:   1. End stage renal disease  Will plan dialysis tomorrow as no acute needs today  ,may attempt cannulation of AVF  2. Acute respiratory Failure   Treated with vanc and zosyn Vent support 3. Catheter malfunction    Single needle cannulation of fistula    Placed 3/19  Underwent multisegment angioplasty 5/17  ( Dr Oneida Alar)  4. Diabetes stable  5. Acute encephalopathy  EEG as above and moving towards establishing goals of care    LOS: 4 [name] Dudley W _0 _1 :05 AM

## 2017-07-16 NOTE — Progress Notes (Signed)
Pt weaned for 8 hours on PS 5/ PEEP +5. Placed back on previous settings d/t sustained increased RR and agitation. RN aware.

## 2017-07-16 NOTE — Clinical Social Work Note (Signed)
Clinical Social Work Assessment  Patient Details  Name: [name] Dudley MRN: [id number] Date of Birth: 1945/12/29  Date of referral:  07/16/17               Reason for consult:  Other (Comment Required)(find family)                Permission sought to share information with:  Family Supports Permission granted to share information::  Yes, Release of Information Signed  Name::     [name] Dudley   Agency::  family/friend  Relationship::  friend   Contact Information:  [name] FurnaceRobert Dudley [phone]  Housing/Transportation Living arrangements for the past 2 months:  Single Family Home(alone. ) Source of Information:  Friend/Neighbor(Robert and Melven SartoriusVanessa Dudley. ) Patient Interpreter Needed:  None Criminal Activity/Legal Involvement Pertinent to Current Situation/Hospitalization:  No - Comment as needed Significant Relationships:  Adult Children, Friend, Siblings Lives with:  Self Do you feel safe going back to the place where you live?  Yes Need for family participation in patient care:  Yes (Comment)  Care giving concerns:  CSW attempted to speak with pt's relatives listed in chart, however CSW was unable to speak with them therefore CSW spoke with pt's friend. CSW was informed that at this time they have no concerns regarding pt.    Social Worker assessment / plan:  CSW spoke with Molly Maduroobert and The St. Paul TravelersVanessa Dudley regarding pt. CSW was informed that they have known pt since 2015. They report that they sometimes help pt with things such as going to the store, dialysis, as well as [place] [facility] [place] [facility]. CSW was informed by friends that they have been keep family updated with pt and that pt does have a sister but sister lives out of town. CSW attempted to speak with pt's daughter and sister however not able to reach them.  During this assessment Erie NoeVanessa and Molly MaduroRobert where pleasant and answered questions to the best of their knowledge. Tone of voice was calm and  understanding.   Employment status:  Retired Health and safety inspectornsurance information:  Medicare, VA Benefit(ONLY part A. ) PT Recommendations:  Not assessed at this time Information / Referral to community resources:  Other (Comment Required)(none discussed at this time. )  Patient/Family's Response to care:  Friends response to care was understanding and appropriate for diagnosis given at this time.   Patient/Family's Understanding of and Emotional Response to Diagnosis, Current Treatment, and Prognosis:  No further questions or concerns have been presented to CSW at this time. Emotional response of friends were overjoyed that someone had reached out to them to speak about pt at this time.   Emotional Assessment Appearance:  Appears stated age Attitude/Demeanor/Rapport:  Unable to Assess(pt intubated. ) Affect (typically observed):  Unable to Assess(pt intubated. ) Orientation:  (pt intuabted.) Alcohol / Substance use:  Not Applicable Psych involvement (Current and /or in the community):  No (Comment)  Discharge Needs  Concerns to be addressed:  Care Coordination Readmission within the last 30 days:  No Current discharge risk:  Dependent with Mobility, Other(pt remains intubated at this time. ) Barriers to Discharge:  Continued Medical Work up   Sempra EnergyKierra S Bernhardt Riemenschneider, LCSWA 07/16/2017, 9:46 AM

## 2017-07-16 NOTE — Progress Notes (Signed)
eLink Physician-Brief Progress Note Patient Name: [name] LemonsSylvester Dudley DOB: February 06, 1945 MRN: [id number]   Date of Service  07/16/2017  HPI/Events of Note    eICU Interventions  flexiseal     Intervention Category Intermediate Interventions: Other:(loose stool on lactulose)  Max Fickleouglas Jeanee Fabre 07/16/2017, 7:52 PM

## 2017-07-16 NOTE — Progress Notes (Signed)
PULMONARY / CRITICAL CARE MEDICINE   Name: [name] Dudley MRN: [id number] DOB: 1945/09/27    ADMISSION DATE:  07/23/2017 CONSULTATION DATE:  07/03/2017  REFERRING MD:  Meridee Score, MD  CHIEF COMPLAINT:  Unresponsive, found down  HISTORY OF PRESENT ILLNESS:   72 year old male history of end-stage renal disease on hemodialysis via right PermCath, insulin-dependent diabetes mellitus who was found down on a wellness check.  No family or friends are present at the time of my examination, however the patient was reportedly last seen normal on Wednesday and per report was not complaining of any sick symptoms at that time.  Police found him unresponsive with vomitus throughout his home and when EMS arrived he was noted to have pinpoint pupils.  There is also report of an empty pill bottle that actually belonged to his friend who normally gives him rides to dialysis, they stated that the name started with hydro-and they believed it to be a narcotic.  Patient was bag mask ventilated in route to the emergency department.  On arrival he was intubated for airway protection.  He was noted to be hyperkalemic for which he received insulin, dextrose, calcium.  SUBJECTIVE:   tol PS wean  Afebrile overnight  Tolerated HD yesterday through fistula  Remains off pressors  Not waking up    VITAL SIGNS: BP 122/68   Pulse 80   Temp 98.1 F (36.7 C) (Oral)   Resp (!) 28   Ht 5\' 7"  (1.702 m)   Wt 82.6 kg (182 lb 1.6 oz)   SpO2 100%   BMI 28.52 kg/m   HEMODYNAMICS:    VENTILATOR SETTINGS: Vent Mode: PSV;CPAP FiO2 (%):  [40 %] 40 % Set Rate:  [18 bmp] 18 bmp Vt Set:  [620 mL] 620 mL PEEP:  [5 cmH20] 5 cmH20 Pressure Support:  [5 cmH20] 5 cmH20 Plateau Pressure:  [20 cmH20-21 cmH20] 21 cmH20  INTAKE / OUTPUT: I/O last 3 completed shifts: In: 860.1 [I.V.:208.9; NG/GT:265; IV Piggyback:386.2] Out: 1697 [Urine:3; Other:1694]  PHYSICAL EXAMINATION: General: chronically ill appearing male, NAD on  PS 5/5  HEENT: mm moist, ETT, no JVD  Neuro: no sedation, does not open eyes, + cough, grimaces to pain, does not follow commands CV: s1s2 rrr, No RMG PULM:  resps even non labored on PS 5/5, good vent mechanics, few scattered rhonchi  GI: mildly distended, +bs  Extremities: warm and dry, no sig edema, LUE fistula +thrill/bruit  Skin: no rashes or lesions, warm and dry  LABS:  BMET Recent Labs  Lab 07/15/17 1157 07/15/17 1735 07/16/17 0454  NA 138 138 138  K 3.7 3.8 4.3  CL 98* 97* 98*  CO2 24 23 21*  BUN 43* 22* 43*  CREATININE 5.62* 3.62* 5.63*  GLUCOSE 132* 97 164*    Electrolytes Recent Labs  Lab 07/13/17 0905  07/15/17 1157 07/15/17 1735 07/16/17 0454  CALCIUM 8.9   < > 9.0 9.0 8.6*  MG 1.7  --  2.1  --  2.1  PHOS 7.4*   < > 4.1 3.5 6.6*   < > = values in this interval not displayed.    CBC Recent Labs  Lab 07/14/17 0628 07/15/17 1157 07/16/17 0454  WBC 13.5* 15.0* 17.4*  HGB 9.6* 9.6* 9.2*  HCT 28.4* 28.2* 28.0*  PLT 179 168 158    Coag's Recent Labs  Lab 07/01/2017 1726 07/14/17 0628 07/16/17 0454  APTT  --  28 51*  INR 1.28  --   --  Sepsis Markers Recent Labs  Lab Aug 07, 2017 2150 07/13/17 0905 07/14/17 0040 07/14/17 0452 07/14/17 0628  LATICACIDVEN 2.1* 5.3* 5.4* 3.9* 2.1*  PROCALCITON 46.63 85.17  --   --  89.97    ABG Recent Labs  Lab 08/07/2017 1812  PHART 7.366  PCO2ART 39.6  PO2ART 152.0*    Liver Enzymes Recent Labs  Lab 08/07/17 1726  07/15/17 1157 07/15/17 1735 07/16/17 0454  AST 45*  --   --   --   --   ALT 15*  --   --   --   --   ALKPHOS 85  --   --   --   --   BILITOT 0.7  --   --   --   --   ALBUMIN 2.7*   < > 2.0* 2.0* 1.8*   < > = values in this interval not displayed.    Cardiac Enzymes Recent Labs  Lab 07/13/17 1522  TROPONINI 0.30*    Glucose Recent Labs  Lab 07/15/17 2026 07/15/17 2047 07/15/17 2106 07/16/17 0009 07/16/17 0355 07/16/17 0754  GLUCAP 58* 62* 118* 130* 160* 144*     Imaging Dg Chest Port 1 View  Result Date: 07/16/2017 CLINICAL DATA:  Hypoxia EXAM: PORTABLE CHEST 1 VIEW COMPARISON:  July 15, 2017 FINDINGS: Endotracheal tube tip is 3.5 cm above the carina. Central catheter tip is at the cavoatrial junction. Nasogastric tube tip and side port are below the diaphragm with side port seen in the proximal stomach. No pneumothorax. There is consolidation in the left lower lobe with small left pleural effusion. There is slight atelectasis in the left mid lung. Lungs elsewhere are clear. Heart is upper normal in size with pulmonary vascularity normal. No adenopathy. No evident bone lesions. IMPRESSION: Tube and catheter positions as described without pneumothorax. Consolidation left lower lobe, likely pneumonia, with small left pleural effusion. Mild left midlung atelectasis. Right lung clear. Stable cardiac silhouette. There is aortic atherosclerosis. Aortic Atherosclerosis (ICD10-I70.0). Electronically Signed   By: Bretta Bang III M.D.   On: 07/16/2017 07:34   US Abdomen Limited Ruq  Result Date: 07/16/2017 CLINICAL DATA:  Acute encephalopathy. Query cirrhosis. History of renal insufficiency on dialysis. Hypertension. EXAM: ULTRASOUND ABDOMEN LIMITED RIGHT UPPER QUADRANT COMPARISON:  Ultrasound kidneys 12/14/2016 FINDINGS: Gallbladder: Cholelithiasis with 7 mm stone in the gallbladder. No gallbladder wall thickening or edema. Common bile duct: Diameter: 5.5 mm, normal Liver: Heterogeneous liver parenchymal echotexture pattern is most likely associated with fatty infiltration. No focal lesions identified. Portal vein is patent on color Doppler imaging with normal direction of blood flow towards the liver. Visualized right kidney demonstrates increased echotexture consistent with medical renal disease, similar to prior study. IMPRESSION: 1. Cholelithiasis without evidence of cholecystitis. 2. Heterogeneous liver parenchymal echotexture likely associated with  geographic fatty infiltration. Electronically Signed   By: Burman Nieves M.D.   On: 07/16/2017 01:28     STUDIES:  6/15  CTH >> small vessel ischemic disease, no acute abnormality  6/15  CXR >> interstitial edema, decreased from prior 6/16  EEG >> abnormal due to the presence of moderate diffuse background slowing with triphasic waves. This is non-specific in etiology and can be seen with hypoxic/ischemic injury, toxic/metabolic encephalopathies, neurodegenerative disorders, or medication effect.    CULTURES: BCx2 6/15 >> Tracheal aspirate 6/15 >>abundant GNC, mod GPC>>> H Flu, Beta Lactamase Negative  ANTIBIOTICS: Vancomycin 6/15 >>6/18 Zosyn 6/16 >> 6/18 Ceftriaxone 6/18>>>  SIGNIFICANT EVENTS: 6/15  Admit with AMS, found down at home, ?  narc contribution, intubated in ER  LINES/TUBES: ETT 6/15 >>   DISCUSSION: 72 year old male history of end-stage renal disease on hemodialysis with acute respiratory failure, AMS, aspiration PNA.     ASSESSMENT / PLAN:  PULMONARY A: Acute respiratory failure  PNA - H Flu  P:   Vent support - 8cc/kg  F/u CXR 6/19 Trend ABG Wean PEEP / FiO2 for sats > 90% Empiric abx as above  PS wean as tol - mental status does not support extubation at this time - see neuro    CARDIOVASCULAR A:  Irregular rhtyhm - ? type II mobitz. Suspect r/t metabolic derangements.  Improving.  H/o HTN -  Echo EF 55%,  P:  Tele  No anticoagulation for now EKG PRN  Consider cards consult F/u chem   RENAL A:   ESRD with uremia  Hyperkalemia HD Catheter malfunction  P:   Nephrology following, appreciate input  Trend BMP   Replace electrolytes as indicated Tolerated HD 6/18 via fistula  Likely HD again 6/20  ??remove perm cath -- not functioning during CRRT, source for infection, fistula working    GASTROINTESTINAL A:   Reported vomiting - none since admit  P:   NPO / OGT  Advance TF to goal  Pepcid for SUP   HEMATOLOGIC A:   No  active issues P: Trend CBC  Moitor for active bleeding SQ heparin   INFECTIOUS A:   Leukopenia, encephalopathy Fever -- improved  P:   Follow cultures  Ceftriaxone as above  Trend pct with ongoing leukocytosis  ?remove perm-cath as above   ENDOCRINE A:   Diabetes mellitus   P: CBG Q 4   SSI  Hold home lantus   NEUROLOGIC A:   Acute Metabolic Encephalopathy - uremia, fever / infectious source Mental status not significantly improved 6/19 despite CRRT, HD, improved ammonia  P:   RASS goal: 0  Off sedation currently  PRN fentanyl for pain / sedation  May need neuro input   Consider MRI Continue lactulose    FAMILY  - Updates:  No family at bedside 6/19.  Friends who brought him to hospital were updated yesterday.  Social work still attempting to contact pts family.     - Inter-disciplinary family meet or Palliative Care meeting due by Day 7    Dirk DressKaty Whiteheart, NP 07/16/2017  10:26 AM Pager: [phone] or [phone]

## 2017-07-16 NOTE — Progress Notes (Signed)
CSW attempted to speak with pt's sister and daughter listed on contacts for pt. CSW was unable to get in touch with either of them as sisters phone number reported a busy signal and daughters number was not a working number per recoding. CSW spoke with friend listed in chart for assessment.   Claude MangesKierra S. Markeda Narvaez, MSW, LCSW-A Emergency Department Clinical Social Worker [phone]

## 2017-07-17 ENCOUNTER — Inpatient Hospital Stay (HOSPITAL_COMMUNITY): Payer: Medicare Other

## 2017-07-17 DIAGNOSIS — J690 Pneumonitis due to inhalation of food and vomit: Secondary | ICD-10-CM

## 2017-07-17 DIAGNOSIS — I469 Cardiac arrest, cause unspecified: Secondary | ICD-10-CM

## 2017-07-17 DIAGNOSIS — J9620 Acute and chronic respiratory failure, unspecified whether with hypoxia or hypercapnia: Secondary | ICD-10-CM

## 2017-07-17 LAB — RENAL FUNCTION PANEL
ALBUMIN: 2.1 g/dL — AB (ref 3.5–5.0)
ANION GAP: 18 — AB (ref 5–15)
Albumin: 1.7 g/dL — ABNORMAL LOW (ref 3.5–5.0)
Anion gap: 31 — ABNORMAL HIGH (ref 5–15)
BUN: 80 mg/dL — ABNORMAL HIGH (ref 6–20)
BUN: 42 mg/dL — AB (ref 6–20)
CALCIUM: 10.3 mg/dL (ref 8.9–10.3)
CO2: 22 mmol/L (ref 22–32)
CO2: 13 mmol/L — AB (ref 22–32)
Calcium: 9.2 mg/dL (ref 8.9–10.3)
Chloride: 100 mmol/L — ABNORMAL LOW (ref 101–111)
Chloride: 98 mmol/L — ABNORMAL LOW (ref 101–111)
Creatinine, Ser: 7.94 mg/dL — ABNORMAL HIGH (ref 0.61–1.24)
Creatinine, Ser: 5.35 mg/dL — ABNORMAL HIGH (ref 0.61–1.24)
GFR calc Af Amer: 11 mL/min — ABNORMAL LOW (ref >60)
GFR calc non Af Amer: 6 mL/min — ABNORMAL LOW (ref >60)
GFR calc non Af Amer: 10 mL/min — ABNORMAL LOW (ref >60)
GFR, EST AFRICAN AMERICAN: 7 mL/min — AB (ref >60)
GLUCOSE: 333 mg/dL — AB (ref 65–99)
GLUCOSE: 65 mg/dL (ref 65–99)
PHOSPHORUS: 13.4 mg/dL — AB (ref 2.5–4.6)
POTASSIUM: 3.5 mmol/L (ref 3.5–5.1)
Phosphorus: 7.2 mg/dL — ABNORMAL HIGH (ref 2.5–4.6)
Potassium: 4.3 mmol/L (ref 3.5–5.1)
SODIUM: 140 mmol/L (ref 135–145)
SODIUM: 142 mmol/L (ref 135–145)

## 2017-07-17 LAB — GLUCOSE, CAPILLARY
GLUCOSE-CAPILLARY: 277 mg/dL — AB (ref 65–99)
Glucose-Capillary: 298 mg/dL — ABNORMAL HIGH (ref 65–99)
Glucose-Capillary: 256 mg/dL — ABNORMAL HIGH (ref 65–99)
Glucose-Capillary: 175 mg/dL — ABNORMAL HIGH (ref 65–99)
Glucose-Capillary: 79 mg/dL (ref 65–99)
Glucose-Capillary: 147 mg/dL — ABNORMAL HIGH (ref 65–99)

## 2017-07-17 LAB — BLOOD GAS, ARTERIAL
Acid-base deficit: 17 mmol/L — ABNORMAL HIGH (ref 0.0–2.0)
BICARBONATE: 11.5 mmol/L — AB (ref 20.0–28.0)
Drawn by: 249101
FIO2: 100
LHR: 18 {breaths}/min
MECHVT: 620 mL
O2 SAT: 94.9 %
PATIENT TEMPERATURE: 98.6
PCO2 ART: 44 mmHg (ref 32.0–48.0)
PEEP/CPAP: 5 cmH2O
PH ART: 7.045 — AB (ref 7.350–7.450)
PO2 ART: 122 mmHg — AB (ref 83.0–108.0)

## 2017-07-17 LAB — CBC
HCT: 25.5 % — ABNORMAL LOW (ref 39.0–52.0)
HCT: 25.8 % — ABNORMAL LOW (ref 39.0–52.0)
HEMOGLOBIN: 8.6 g/dL — AB (ref 13.0–17.0)
Hemoglobin: 8 g/dL — ABNORMAL LOW (ref 13.0–17.0)
MCH: 26.4 pg (ref 26.0–34.0)
MCH: 26.2 pg (ref 26.0–34.0)
MCHC: 33.7 g/dL (ref 30.0–36.0)
MCHC: 31 g/dL (ref 30.0–36.0)
MCV: 78.2 fL (ref 78.0–100.0)
MCV: 84.6 fL (ref 78.0–100.0)
Platelets: 168 10*3/uL (ref 150–400)
Platelets: 156 10*3/uL (ref 150–400)
RBC: 3.26 MIL/uL — AB (ref 4.22–5.81)
RBC: 3.05 MIL/uL — ABNORMAL LOW (ref 4.22–5.81)
RDW: 16 % — ABNORMAL HIGH (ref 11.5–15.5)
RDW: 17.2 % — AB (ref 11.5–15.5)
WBC: 15.9 10*3/uL — ABNORMAL HIGH (ref 4.0–10.5)
WBC: 41.4 10*3/uL — ABNORMAL HIGH (ref 4.0–10.5)

## 2017-07-17 LAB — POCT I-STAT 3, ART BLOOD GAS (G3+)
ACID-BASE DEFICIT: 7 mmol/L — AB (ref 0.0–2.0)
Bicarbonate: 17.2 mmol/L — ABNORMAL LOW (ref 20.0–28.0)
O2 Saturation: 100 %
PO2 ART: 185 mmHg — AB (ref 83.0–108.0)
Patient temperature: 98.6
TCO2: 18 mmol/L — ABNORMAL LOW (ref 22–32)
pCO2 arterial: 28.8 mmHg — ABNORMAL LOW (ref 32.0–48.0)
pH, Arterial: 7.385 (ref 7.350–7.450)

## 2017-07-17 LAB — CULTURE, BLOOD (ROUTINE X 2)
Culture: NO GROWTH
Culture: NO GROWTH
Special Requests: ADEQUATE
Special Requests: ADEQUATE

## 2017-07-17 LAB — IRON AND TIBC
Iron: 67 ug/dL (ref 45–182)
SATURATION RATIOS: 61 % — AB (ref 17.9–39.5)
TIBC: 111 ug/dL — ABNORMAL LOW (ref 250–450)
UIBC: 44 ug/dL

## 2017-07-17 LAB — MAGNESIUM
Magnesium: 2.6 mg/dL — ABNORMAL HIGH (ref 1.7–2.4)
Magnesium: 3 mg/dL — ABNORMAL HIGH (ref 1.7–2.4)

## 2017-07-17 LAB — PROCALCITONIN: Procalcitonin: 58.46 ng/mL

## 2017-07-17 MED ORDER — INSULIN GLARGINE 100 UNIT/ML ~~LOC~~ SOLN
15.0000 [IU] | Freq: Every day | SUBCUTANEOUS | Status: DC
  Administered 2017-07-17 – 2017-07-21 (×5): 15 [IU] via SUBCUTANEOUS
  Filled 2017-07-17 (×6): qty 0.15

## 2017-07-17 MED ORDER — DARBEPOETIN ALFA 100 MCG/0.5ML IJ SOSY
PREFILLED_SYRINGE | INTRAMUSCULAR | Status: AC
  Administered 2017-07-17: 100 ug via INTRAVENOUS
  Filled 2017-07-17: qty 0.5

## 2017-07-17 MED ORDER — ATROPINE SULFATE 1 MG/10ML IJ SOSY
PREFILLED_SYRINGE | INTRAMUSCULAR | Status: AC
  Filled 2017-07-17: qty 10

## 2017-07-17 MED ORDER — NOREPINEPHRINE 16 MG/250ML-% IV SOLN
0.0000 ug/min | INTRAVENOUS | Status: DC
  Administered 2017-07-17: 20 ug/min via INTRAVENOUS
  Administered 2017-07-18: 10 ug/min via INTRAVENOUS
  Administered 2017-07-19 – 2017-07-20 (×2): 20 ug/min via INTRAVENOUS
  Administered 2017-07-22: 10 ug/min via INTRAVENOUS
  Filled 2017-07-17 (×6): qty 250

## 2017-07-17 MED ORDER — NOREPINEPHRINE 4 MG/250ML-% IV SOLN
0.0000 ug/min | INTRAVENOUS | Status: DC
  Administered 2017-07-17: 40 ug/min via INTRAVENOUS
  Filled 2017-07-17: qty 250

## 2017-07-17 MED ORDER — DARBEPOETIN ALFA 100 MCG/0.5ML IJ SOSY
100.0000 ug | PREFILLED_SYRINGE | INTRAMUSCULAR | Status: DC
  Administered 2017-07-17: 100 ug via INTRAVENOUS
  Filled 2017-07-17: qty 0.5

## 2017-07-17 MED ORDER — ALTEPLASE 2 MG IJ SOLR
2.0000 mg | Freq: Once | INTRAMUSCULAR | Status: DC | PRN
Start: 2017-07-17 — End: 2017-07-22
  Filled 2017-07-17: qty 2

## 2017-07-17 MED ORDER — VASOPRESSIN 20 UNIT/ML IV SOLN
0.0300 [IU]/min | INTRAVENOUS | Status: DC
  Administered 2017-07-17 – 2017-07-21 (×5): 0.03 [IU]/min via INTRAVENOUS
  Filled 2017-07-17 (×5): qty 2

## 2017-07-17 MED ORDER — EPINEPHRINE PF 1 MG/ML IJ SOLN
0.5000 ug/min | INTRAVENOUS | Status: DC
  Filled 2017-07-17: qty 4

## 2017-07-17 MED ORDER — HEPARIN SODIUM (PORCINE) 1000 UNIT/ML IJ SOLN
3000.0000 [IU] | Freq: Once | INTRAMUSCULAR | Status: AC
  Administered 2017-07-17: 3000 [IU] via INTRAVENOUS

## 2017-07-17 MED ORDER — SODIUM CHLORIDE 0.9 % IV SOLN: 100.0000 mL | INTRAVENOUS | Status: DC | PRN

## 2017-07-17 MED ORDER — LIDOCAINE HCL (PF) 1 % IJ SOLN: 5.0000 mL | INTRAMUSCULAR | Status: DC | PRN

## 2017-07-17 MED ORDER — LIDOCAINE-PRILOCAINE 2.5-2.5 % EX CREA
1.0000 "application " | TOPICAL_CREAM | CUTANEOUS | Status: DC | PRN
  Filled 2017-07-17: qty 5

## 2017-07-17 MED ORDER — DOPAMINE-DEXTROSE 3.2-5 MG/ML-% IV SOLN
0.0000 ug/kg/min | INTRAVENOUS | Status: DC
  Administered 2017-07-17: 10 ug/kg/min via INTRAVENOUS

## 2017-07-17 MED ORDER — SODIUM BICARBONATE 8.4 % IV SOLN
INTRAVENOUS | Status: DC
  Administered 2017-07-17 – 2017-07-19 (×5): via INTRAVENOUS
  Filled 2017-07-17 (×6): qty 150

## 2017-07-17 MED ORDER — HEPARIN SODIUM (PORCINE) 1000 UNIT/ML DIALYSIS: 1000.0000 [IU] | INTRAMUSCULAR | Status: DC | PRN

## 2017-07-17 MED ORDER — SODIUM BICARBONATE 8.4 % IV SOLN
100.0000 meq | Freq: Once | INTRAVENOUS | Status: AC
  Administered 2017-07-17: 100 meq via INTRAVENOUS

## 2017-07-17 MED ORDER — SODIUM BICARBONATE 8.4 % IV SOLN
INTRAVENOUS | Status: AC
  Filled 2017-07-17: qty 50

## 2017-07-17 MED ORDER — INSULIN ASPART 100 UNIT/ML ~~LOC~~ SOLN
0.0000 [IU] | SUBCUTANEOUS | Status: DC
  Administered 2017-07-17: 3 [IU] via SUBCUTANEOUS
  Administered 2017-07-17: 4 [IU] via SUBCUTANEOUS
  Administered 2017-07-17 (×2): 11 [IU] via SUBCUTANEOUS
  Administered 2017-07-18: 7 [IU] via SUBCUTANEOUS
  Administered 2017-07-18: 4 [IU] via SUBCUTANEOUS
  Administered 2017-07-18: 3 [IU] via SUBCUTANEOUS
  Administered 2017-07-18 (×2): 4 [IU] via SUBCUTANEOUS
  Administered 2017-07-19: 3 [IU] via SUBCUTANEOUS
  Administered 2017-07-19 (×3): 4 [IU] via SUBCUTANEOUS
  Administered 2017-07-19: 7 [IU] via SUBCUTANEOUS
  Administered 2017-07-19 – 2017-07-21 (×9): 3 [IU] via SUBCUTANEOUS

## 2017-07-17 MED ORDER — SODIUM CHLORIDE 0.9 % IV SOLN: INTRAVENOUS | Status: DC | PRN

## 2017-07-17 MED ORDER — PENTAFLUOROPROP-TETRAFLUOROETH EX AERO: 1.0000 "application " | INHALATION_SPRAY | CUTANEOUS | Status: DC | PRN

## 2017-07-17 NOTE — Progress Notes (Signed)
   07/17/17 2200  Clinical Encounter Type  Visited With Patient;Patient and family together  Visit Type Initial  Referral From Nurse  Consult/Referral To Chaplain  Spiritual Encounters  Spiritual Needs Emotional  Stress Factors  Patient Stress Factors Exhausted  Family Stress Factors Exhausted    Pt was having dialysis when he coded. When I arrived, no family member on-site but friend arrived soon after. Later pt's cousin arrived. Chaplain and doctor called the [place]'s [place]. Pt's [place] [facility]. Chaplain provided compassionate  Taylon Louison a [place], Chaplain  presence.

## 2017-07-17 NOTE — Progress Notes (Signed)
patient Diabetes Program Recommendations  AACE/ADA: New Consensus Statement on Inpatient Glycemic Control (2015)  Target Ranges:  Prepandial:   less than 140 mg/dL      Peak postprandial:   less than 180 mg/dL (1-2 hours)      Critically ill patients:  140 - 180 mg/dL   Lab Results  Component Value Date   GLUCAP 298 (H) 07/17/2017   HGBA1C 5.2 12/15/2016   Review of Glycemic Control  Diabetes history: DM 2 Outpatient Diabetes medications: Lantus 4 units qhs Current orders for Inpatient glycemic control: Lantus 15 units, Novolog 0-20 units Q4 hours  Inpatient Diabetes Program Recommendations:    Glucose trends increased since Tube Feeds were increased to goal rate 60 ml/hour. Noted Lantus 15 units started and Novolog Resistant scale ordered Q4. Patient is critically ill, consider ICU Glycemic Control Order set starting phase 2 IV insulin to discover insulin needs from the patient. Patient may also need tube feed coverage due to glucose trends being at goal prior to initiation of Tube Feeds. Patient was only on 4 units of basal insulin at home.  Thanks,  [name] DeemShannon Tyshaun Vinzant RN, MSN, BC-ADM, [facility] Inpatient Diabetes Coordinator Team Pager [phone] (8a-5p)

## 2017-07-17 NOTE — Progress Notes (Signed)
CSW spoke with pt's daughter listed in chart. CSW expressed to daughter that CSW has been attempting to speak with her to gather information on father but was unable to due to number in chart being incorrect (correct now). CSW informed daughter that CSW had spoken with pt's friend Molly MaduroRobert and got clarify on information needed but asked that daughter provide any further information if possible. Daughter verbalized that all information CSW had was accurate and that she would be here Saturday or Sunday of this week to stay with pt unit pt is better. CSW will continue to follow family and pt for support at this time.   Claude MangesKierra S. Shelly Shoultz, MSW, LCSW-A Emergency Department Clinical Social Worker [phone]

## 2017-07-17 NOTE — Progress Notes (Signed)
[facility]  Called at 6:40 for bradycardia that developed near the end of HD.  HD nurse notes that they were taking blood from the fistula and then having to return it to the permcath.  About 30 minutes after they made that change he became bradycardic.  We gave epinephrine, calcium and bicarbonate.  Despite that about 5-10 minutes later he developed a cardiac arrest (PEA) and required CPR for 12 minutes.  He regained a pulse.  He had VFib and required dc cardioversion. He also received amiodarone.  He now has a pulse with levophed, dopamine going.    I worry about an air embolism or a primary cardiac event (MI). There was transient ischemic changes while bradycardic, none since.  Need to assess for metabolic causes as well.   Plan: 12 lead now Labs: CBC, CMET, lactic acid, ABG CXR stat Start vasopressin Start epinephrine drip Continue levophed  If these labs are normal then he needs a CT angiogram to assess for PE or air embolism. If CT angiogram is negative then would cardiology consult.  Heber CarolinaBrent McQuaid, MD [facility] Pager: [phone] Cell: [phone] After 3pm or if no response, call [phone]

## 2017-07-17 NOTE — Progress Notes (Signed)
Pt suddenly turned brady. Rhythm started to change. EKG was done immediatly. MD/Elink and code was called. MD  And staff arrived. Pt lost his pulse. CPR started. ACLS Meds given per Protocol.  Rhythm is returned. Levo and dopamine started.

## 2017-07-17 NOTE — Progress Notes (Signed)
Pre treatment, spoke with Dr. Arrie Aranoladonato regarding cannulation of LUA AVF and received permission to cannulate AVF for art and ven lines.  AVF with good thrill and bruit, however diameter does feel small.   1457-treatment started per policy.  See flowsheet for documentation.   1500-BFR increased to 250 cc/min with VP -80, VP 150. 1610-Venous pressure increased to 280-300, assessing site, no infiltrate noted. 1615-Venous pressure jumped to 340 and small infiltrate noted above venous needle.  Treatment immediately interrupted, venous line clamped, HD cath accessed for venous port.  Venous port of HD cath slightly sluggish to aspirate, but flushes well.  Venous line connected to venous cath port, hemosafe applied and treatment resumed 1618. 1625-venous needle removed, infiltrate approx 4 cm in size.  Hemostasis achieved, and ice pack applied.  Will continue to monitor during treatment.

## 2017-07-17 NOTE — Progress Notes (Signed)
Event documentation from hemodialysis treatment prior to cardiac event: 1737 - 25 Gm Albumin given for BP support. 1750 - system clotted, returned arterial line, unable to return remainder of system. [id number] - treatment resumed with new HD setup. 1830 - patient resp at 32, heart rate @ 61, then trended down to low 50's.  Elijio MilesK Jenkins, RN arrived to room.  Pt unresponsive to painful stimuli.   1832 - art pressure elevated, BP cycled. [id number] - treatment ended.  2100 unit staff in room with code blue subsequently called.  Care of patient turned over to code team and primary RN. 1835 - UF 2625 with net UF 1925. Nicostratus.[email or web address] - Dr. Arrie Aranoladonato notified of above events.

## 2017-07-17 NOTE — Procedures (Signed)
Arterial Catheter Insertion Procedure Note [name] Dudley [id number] [birth year]  Procedure: Insertion of Arterial Catheter  Indications: Blood pressure monitoring and Frequent blood sampling  Procedure Details Consent: Risks of procedure as well as the alternatives and risks of each were explained to the (patient/caregiver).  Consent for procedure obtained. Time Out: Verified patient identification, verified procedure, site/side was marked, verified correct patient position, special equipment/implants available, medications/allergies/relevent history reviewed, required imaging and test results available.  Performed  Maximum sterile technique was used including antiseptics, cap, gloves, gown, hand hygiene, mask and sheet. Skin prep: Chlorhexidine; local anesthetic administered 20 gauge catheter was inserted into right radial artery using the Seldinger technique. ULTRASOUND GUIDANCE USED: NO Evaluation Blood flow good; BP tracing good. Complications: No apparent complications.   [name] ComberJoseph R Drayce Dudley 07/17/2017

## 2017-07-17 NOTE — Progress Notes (Signed)
[facility]  Called family member [name] and let him know about what's happening.  [name] [name], MD [facility] Pager: [phone] Cell: [phone] After 3pm or if no response, call [phone]

## 2017-07-17 NOTE — Progress Notes (Signed)
[facility] ROUNDING NOTE   Subjective:   72 year old male history of end-stage renal disease ( started Nov 2018) on hemodialysis via right PermCath, insulin-dependent diabetes mellitus who was found down on a wellness check. Police found him unresponsive with vomitus throughout his home and when EMS arrived he was noted to have pinpoint pupils.  There is also report of an empty pill bottle that actually belonged to his friend who normally gives him rides to dialysis, they stated that the name started with hydro-and they believed it to be a narcotic. In the emergency room the patient was found to be hyperkalemic and ws started on CRRT   The permcath has had poor function during CRRT and clotted multiple times 6/17 despite use of TPA and heparin in the circuit   It was decided to transition the patient to intermittent hemodialysis particularly as the patient is now hemodynamically stable and is no longer requiring pressors. EEG  findings indicates diffuse cerebral dysfunction that is non-specific in etiology and can be seen with hypoxic/ischemic injury, toxic/metabolic encephalopathies, neurodegenerative disorders, or medication effect.  Remains intubated. Attempts are being made to keep friends and family informed. Decisions about goals of care would be appropriate at this stage- it looks like we are heading to palliative care   Mental condition does not allow for intubation. Pupils are sluggish       Objective:  Vital signs in last 24 hours:  Temp:  [98.2 F (36.8 C)-100 F (37.8 C)] 99.5 F (37.5 C) (06/20 0747) Pulse Rate:  [69-111] 95 (06/20 0800) Resp:  [18-32] 32 (06/20 0800) BP: (115-203)/(42-75) 118/42 (06/20 0800) SpO2:  [93 %-100 %] 93 % (06/20 0800) FiO2 (%):  [40 %] 40 % (06/20 0800) Weight:  [182 lb 1.6 oz (82.6 kg)] 182 lb 1.6 oz (82.6 kg) (06/20 0500)  Weight change: -5 lb 15.2 oz (-2.7 kg) Filed Weights   07/15/17 1753 07/16/17 0500 07/17/17 0500  Weight:  184 lb 8.4 oz (83.7 kg) 182 lb 1.6 oz (82.6 kg) 182 lb 1.6 oz (82.6 kg)    Intake/Output: I/O last 3 completed shifts: In: 2144.9 [I.V.:235.1; Other:10; NG/GT:1599.7; IV Piggyback:300.1] Out: 400 [Stool:400]   Intake/Output this shift:  Total I/O In: 160 [I.V.:20; Other:20; NG/GT:120] Out: 0   Unresponsive to voice  Withdraws to pain and has sluggish pupils  ETT  CVS- RRR   RS- CTA mechanical breath sounds on vent  ABD- BS present soft non-distended EXT- no edema LUE thrill  AVF    Basic Metabolic Panel: Recent Labs  Lab 07/14/17 1514 07/15/17 1157 07/15/17 1735 07/16/17 0454 07/16/17 1030 07/16/17 1232 07/16/17 1610  NA 135 138 138 138  --   --  138  K 5.0 3.7 3.8 4.3  --   --  3.9  CL 95* 98* 97* 98*  --   --  97*  CO2 _0 21*  --   --  22  GLUCOSE 122* 132* 97 164*  --   --  195*  BUN 56* 43* 22* 43*  --   --  57*  CREATININE 8.25* 5.62* 3.62* 5.63*  --   --  6.63*  CALCIUM 8.8* 9.0 9.0 8.6*  --   --  9.2  MG  --  2.1  --  2.1 SPECIMEN HEMOLYZED. HEMOLYSIS MAY AFFECT INTEGRITY OF RESULTS. 2.2 2.3  PHOS 7.0* 4.1 3.5 6.6* SPECIMEN HEMOLYZED. HEMOLYSIS MAY AFFECT INTEGRITY OF RESULTS. 7.1* 7.4*  7.4*    Liver Function Tests:  Recent Labs  Lab 07/10/2017 1726  07/14/17 1514 07/15/17 1157 07/15/17 1735 07/16/17 0454 07/16/17 1610  AST 45*  --   --   --   --   --   --   ALT 15*  --   --   --   --   --   --   ALKPHOS 85  --   --   --   --   --   --   BILITOT 0.7  --   --   --   --   --   --   PROT 8.8*  --   --   --   --   --   --   ALBUMIN 2.7*   < > 1.9* 2.0* 2.0* 1.8* 1.8*   < > = values in this interval not displayed.   Recent Labs  Lab 07/23/2017 2035  LIPASE 26   Recent Labs  Lab 07/15/17 1952  AMMONIA 23    CBC: Recent Labs  Lab 07/06/2017 1726 07/13/2017 1729 07/13/17 0905 07/14/17 0628 07/15/17 1157 07/16/17 0454  WBC 3.7*  --  11.8* 13.5* 15.0* 17.4*  NEUTROABS 2.1  --   --   --   --   --   HGB 11.8* 11.6* 11.4* 9.6* 9.6* 9.2*  HCT  36.7* 34.0* 35.5* 28.4* 28.2* 28.0*  MCV 82.1  --  83.1 79.1 77.7* 80.9  PLT 267  --  205 179 168 158    Cardiac Enzymes: Recent Labs  Lab 07/17/2017 2150 07/13/17 1522  CKTOTAL 3,519*  --   TROPONINI  --  0.30*    BNP: Invalid input(s): POCBNP  CBG: Recent Labs  Lab 07/16/17 1518 07/16/17 1945 07/16/17 2326 07/17/17 0321 07/17/17 0746  GLUCAP 147* 257* 256* 277* 298*    Microbiology: Results for orders placed or performed during the hospital encounter of 07/08/2017  Culture, respiratory (tracheal aspirate)     Status: None   Collection Time: 07/14/2017  7:50 PM  Result Value Ref Range Status   Specimen Description TRACHEAL ASPIRATE  Final   Special Requests NONE  Final   Gram Stain   Final    ABUNDANT WBC PRESENT, PREDOMINANTLY PMN ABUNDANT GRAM NEGATIVE COCCOBACILLI MODERATE GRAM POSITIVE COCCI    Culture   Final    ABUNDANT HAEMOPHILUS INFLUENZAE BETA LACTAMASE NEGATIVE Performed at [facility], [place] [address] [place], [place] [address]    Report Status 07/15/2017 FINAL  Final  Culture, blood (routine x 2)     Status: None (Preliminary result)   Collection Time: 07/27/2017  8:20 PM  Result Value Ref Range Status   Specimen Description BLOOD PORTA CATH  Final   Special Requests   Final    BOTTLES DRAWN AEROBIC AND ANAEROBIC Blood Culture adequate volume Performed at [facility], [place] [address] [place], [place] [address]    Culture NO GROWTH 4 DAYS  Final   Report Status PENDING  Incomplete  Culture, blood (routine x 2)     Status: None (Preliminary result)   Collection Time: 07/15/2017  8:35 PM  Result Value Ref Range Status   Specimen Description BLOOD RIGHT ANTECUBITAL  Final   Special Requests   Final    BOTTLES DRAWN AEROBIC AND ANAEROBIC Blood Culture adequate volume Performed at [facility], [place] [address] [place], [place] [address]    Culture NO GROWTH 4 DAYS  Final   Report Status PENDING  Incomplete  MRSA PCR Screening  Status: None   Collection Time: 07/21/2017  9:14 PM  Result Value Ref Range Status   MRSA by PCR NEGATIVE NEGATIVE Final    Comment:        The GeneXpert MRSA Assay (FDA approved for NASAL specimens only), is one component of a comprehensive MRSA colonization surveillance program. It is not intended to diagnose MRSA infection nor to guide or monitor treatment for MRSA infections. Performed at [facility], [place] [address] [place], [place] [address]     Coagulation Studies: No results for input(s): LABPROT, INR in the last 72 hours.  Urinalysis: No results for input(s): COLORURINE, LABSPEC, PHURINE, GLUCOSEU, HGBUR, BILIRUBINUR, KETONESUR, PROTEINUR, UROBILINOGEN, NITRITE, LEUKOCYTESUR in the last 72 hours.  Invalid input(s): APPERANCEUR    Imaging: Mr Brain Wo Contrast  Result Date: 07/16/2017 CLINICAL DATA:  Initial evaluation for acute altered mental status. EXAM: MRI HEAD WITHOUT CONTRAST TECHNIQUE: Multiplanar, multiecho pulse sequences of the brain and surrounding structures were obtained without intravenous contrast. COMPARISON:  None. FINDINGS: Brain: Generalized age-related cerebral atrophy. Mild chronic small vessel ischemic change present the periventricular white matter. Small remote lacunar infarct present within the right ventral pons. No abnormal foci of restricted diffusion to suggest acute or subacute ischemia. Gray-white matter differentiation maintained. No other areas of remote cortical infarction. Small amount of chronic hemosiderin staining at the left periatrial white matter. No evidence for acute intracranial hemorrhage. No mass lesion, midline shift or mass effect. No hydrocephalus. No extra-axial fluid collection. Pituitary gland within normal limits. Vascular: Major intracranial vascular flow voids maintained at the skull base. Skull and upper cervical spine: Craniocervical junction normal. No focal marrow replacing lesion. Scalp soft tissues unremarkable.  Sinuses/Orbits: Globes and orbital soft tissues demonstrate no acute finding. Paranasal sinuses are clear. Small bilateral mastoid effusions, right slightly larger than left, of doubtful significance. Other: None. IMPRESSION: 1. No acute intracranial abnormality. 2. Small remote ventral right pontine infarct. 3. Mild chronic small vessel ischemic disease. Electronically Signed   By: Jeannine Boga M.D.   On: 07/16/2017 22:32   Dg Chest Port 1 View  Result Date: 07/16/2017 CLINICAL DATA:  Hypoxia EXAM: PORTABLE CHEST 1 VIEW COMPARISON:  July 15, 2017 FINDINGS: Endotracheal tube tip is 3.5 cm above the carina. Central catheter tip is at the cavoatrial junction. Nasogastric tube tip and side port are below the diaphragm with side port seen in the proximal stomach. No pneumothorax. There is consolidation in the left lower lobe with small left pleural effusion. There is slight atelectasis in the left mid lung. Lungs elsewhere are clear. Heart is upper normal in size with pulmonary vascularity normal. No adenopathy. No evident bone lesions. IMPRESSION: Tube and catheter positions as described without pneumothorax. Consolidation left lower lobe, likely pneumonia, with small left pleural effusion. Mild left midlung atelectasis. Right lung clear. Stable cardiac silhouette. There is aortic atherosclerosis. Aortic Atherosclerosis (ICD10-I70.0). Electronically Signed   By: Lowella Grip III M.D.   On: 07/16/2017 07:34   Dg Abd Portable 1v  Result Date: 07/17/2017 CLINICAL DATA:  Abdominal distention EXAM: PORTABLE ABDOMEN - 1 VIEW COMPARISON:  None. FINDINGS: NG tube tip is in the mid stomach. Relative paucity of gas throughout the abdomen. No free air or organomegaly. Degenerative changes in the lumbar spine. IMPRESSION: NG tube tip in the mid stomach. Paucity of gas throughout the abdomen. Nonspecific bowel gas pattern. Electronically Signed   By: Rolm Baptise M.D.   On: 07/17/2017 08:28   US Abdomen  Limited Ruq  Result Date: 07/16/2017 CLINICAL DATA:  Acute encephalopathy. Query cirrhosis. History of renal insufficiency on dialysis. Hypertension. EXAM: ULTRASOUND ABDOMEN LIMITED RIGHT UPPER QUADRANT COMPARISON:  Ultrasound kidneys 12/14/2016 FINDINGS: Gallbladder: Cholelithiasis with 7 mm stone in the gallbladder. No gallbladder wall thickening or edema. Common bile duct: Diameter: 5.5 mm, normal Liver: Heterogeneous liver parenchymal echotexture pattern is most likely associated with fatty infiltration. No focal lesions identified. Portal vein is patent on color Doppler imaging with normal direction of blood flow towards the liver. Visualized right kidney demonstrates increased echotexture consistent with medical renal disease, similar to prior study. IMPRESSION: 1. Cholelithiasis without evidence of cholecystitis. 2. Heterogeneous liver parenchymal echotexture likely associated with geographic fatty infiltration. Electronically Signed   By: Lucienne Capers M.D.   On: 07/16/2017 01:28     Medications:   . sodium chloride 10 mL/hr at 07/17/17 0600  . sodium chloride    . sodium chloride    . albumin human    . cefTRIAXone (ROCEPHIN)  IV Stopped (07/16/17 2329)  . famotidine (PEPCID) IV Stopped (07/16/17 2310)  . feeding supplement (VITAL AF 1.2 CAL) 1,000 mL (07/16/17 1535)   . chlorhexidine gluconate (MEDLINE KIT)  15 mL Mouth Rinse BID  . Chlorhexidine Gluconate Cloth  6 each Topical Q0600  . Chlorhexidine Gluconate Cloth  6 each Topical Q0600  . feeding supplement (PRO-STAT SUGAR FREE 64)  30 mL Per Tube Daily  . heparin injection (subcutaneous)  5,000 Units Subcutaneous Q8H  . insulin aspart  0-20 Units Subcutaneous Q4H  . insulin glargine  15 Units Subcutaneous Daily  . lactulose  30 g Oral TID  . mouth rinse  15 mL Mouth Rinse 10 times per day   sodium chloride, sodium chloride, sodium chloride, acetaminophen (TYLENOL) oral liquid 160 mg/5 mL, albumin human, alteplase, docusate,  fentaNYL (SUBLIMAZE) injection, heparin, lidocaine (PF), lidocaine-prilocaine, pentafluoroprop-tetrafluoroeth  Assessment/ Plan:   1. End stage renal disease   Plan dialysis and will use fistula today  2. Acute respiratory Failure   Treated with vanc and zosyn  Now stopped  White count continues to increase   Mental status making weaning off ventilator difficult 3. Catheter malfunction    Single needle cannulation of fistula    Placed 3/19  Underwent multisegment angioplasty 5/17  ( Dr Oneida Alar)  4. Diabetes stable  5. Acute encephalopathy  EEG as above and moving towards establishing goals of care  6. Anemia  Decreased hemoglobin will start darbepoietin and check iron stores     LOS: 5 Camren Henthorn W _0 _1 :57 AM

## 2017-07-17 NOTE — Code Documentation (Signed)
  Patient Name: [name] LemonsSylvester Dudley   MRN: [id number]   Date of Birth/ Sex: [birth year] , male      Admission Date: 07/23/2017  Attending Provider: Lupita LeashMcQuaid, Douglas B, MD  Primary Diagnosis: <principal problem not specified>   Indication: Pt was in his usual state of health until this PM, when he was noted to be bradycardic then pulseless. Code blue was subsequently called. At the time of arrival on scene, ACLS protocol was underway.   Technical Description:  - CPR performance duration:  16 minutes  - Was defibrillation or cardioversion used? Yes   - Was external pacer placed? No  - Was patient intubated pre/post CPR? Yes   Medications Administered: Y = Yes; Blank = No Amiodarone  Y  Atropine  Y  Calcium    Epinephrine  Y  Lidocaine    Magnesium    Norepinephrine  Y  Phenylephrine    Sodium bicarbonate  Y  Vasopressin     Post CPR evaluation:  - Final Status - Was patient successfully resuscitated ? Yes - What is current rhythm? sinus - What is current hemodynamic status? stable  Miscellaneous Information:  - Labs sent, including: ABG, Mg, Renal function panel, CBC  - Primary team notified?  Yes  - Family Notified? Yes  - Additional notes/ transfer status:  will remain in ICU     [name] SoldersWinfrey, [name] Rafter C, MD  07/17/2017, 7:32 PM

## 2017-07-17 NOTE — Progress Notes (Signed)
PULMONARY / CRITICAL CARE MEDICINE   Name: [name] Dudley MRN: [id number] DOB: [birth year]    ADMISSION DATE:  Aug 10, 2017 CONSULTATION DATE:  08-10-2017  REFERRING MD:  Meridee Score, MD  CHIEF COMPLAINT:  Unresponsive, found down  HISTORY OF PRESENT ILLNESS:   72 year old male history of end-stage renal disease on hemodialysis via right PermCath, insulin-dependent diabetes mellitus who was found down on a wellness check.  No family or friends are present at the time of my examination, however the patient was reportedly last seen normal on Wednesday and per report was not complaining of any sick symptoms at that time.  Police found him unresponsive with vomitus throughout his home and when EMS arrived he was noted to have pinpoint pupils.  There is also report of an empty pill bottle that actually belonged to his friend who normally gives him rides to dialysis, they stated that the name started with hydro-and they believed it to be a narcotic.  Patient was bag mask ventilated in route to the emergency department.  On arrival he was intubated for airway protection.  He was noted to be hyperkalemic for which he received insulin, dextrose, calcium.  SUBJECTIVE:   tol PS wean yesterday x 8 hours, mental status did not allow for extubation  Loose stools with lactulose  Otherwise no sig change  MRI neg acute changes  Afebrile  Mental status unchanged   VITAL SIGNS: BP (!) 118/42   Pulse 95   Temp 99.5 F (37.5 C) (Oral)   Resp (!) 32   Ht 5\' 7"  (1.702 m)   Wt 82.6 kg (182 lb 1.6 oz)   SpO2 93%   BMI 28.52 kg/m   HEMODYNAMICS:    VENTILATOR SETTINGS: Vent Mode: PSV;CPAP FiO2 (%):  [40 %] 40 % Set Rate:  [18 bmp] 18 bmp Vt Set:  [409 mL] 620 mL PEEP:  [5 cmH20] 5 cmH20 Pressure Support:  [5 cmH20] 5 cmH20 Plateau Pressure:  [10 cmH20-20 cmH20] 20 cmH20  INTAKE / OUTPUT: I/O last 3 completed shifts: In: 2144.9 [I.V.:235.1; Other:10; NG/GT:1599.7; IV Piggyback:300.1] Out: 400  [Stool:400]  PHYSICAL EXAMINATION: General: chronically ill appearing male, NAD on PS wean  HEENT: mm moist, no JVD, ETT  Neuro: no sedation, poorly responsive, grimaces to pain, moves head briefly, does not open eyes, does not follow commands  CV: s1s2 rrr, NSR PULM:  resps even non labored on PS 5/5 GI: mildly distended, +bs  Extremities: warm and dry, no sig edema, LUE fistula +thrill/bruit  Skin: no rashes or lesions, warm and dry  LABS:  BMET Recent Labs  Lab 07/15/17 1735 07/16/17 0454 07/16/17 1610  NA 138 138 138  K 3.8 4.3 3.9  CL 97* 98* 97*  CO2 23 21* 22  BUN 22* 43* 57*  CREATININE 3.62* 5.63* 6.63*  GLUCOSE 97 164* 195*    Electrolytes Recent Labs  Lab 07/15/17 1735 07/16/17 0454 07/16/17 1030 07/16/17 1232 07/16/17 1610  CALCIUM 9.0 8.6*  --   --  9.2  MG  --  2.1 SPECIMEN HEMOLYZED. HEMOLYSIS MAY AFFECT INTEGRITY OF RESULTS. 2.2 2.3  PHOS 3.5 6.6* SPECIMEN HEMOLYZED. HEMOLYSIS MAY AFFECT INTEGRITY OF RESULTS. 7.1* 7.4*  7.4*    CBC Recent Labs  Lab 07/14/17 0628 07/15/17 1157 07/16/17 0454  WBC 13.5* 15.0* 17.4*  HGB 9.6* 9.6* 9.2*  HCT 28.4* 28.2* 28.0*  PLT 179 168 158    Coag's Recent Labs  Lab 2017-08-10 1726 07/14/17 0628 07/16/17 0454  APTT  --  28 51*  INR 1.28  --   --     Sepsis Markers Recent Labs  Lab 07/13/17 0905 07/14/17 0040 07/14/17 0452 07/14/17 0628 07/16/17 1232  LATICACIDVEN 5.3* 5.4* 3.9* 2.1*  --   PROCALCITON 85.17  --   --  89.97 71.75    ABG Recent Labs  Lab 07-26-2017 1812  PHART 7.366  PCO2ART 39.6  PO2ART 152.0*    Liver Enzymes Recent Labs  Lab July 26, 2017 1726  07/15/17 1735 07/16/17 0454 07/16/17 1610  AST 45*  --   --   --   --   ALT 15*  --   --   --   --   ALKPHOS 85  --   --   --   --   BILITOT 0.7  --   --   --   --   ALBUMIN 2.7*   < > 2.0* 1.8* 1.8*   < > = values in this interval not displayed.    Cardiac Enzymes Recent Labs  Lab 07/13/17 1522  TROPONINI 0.30*     Glucose Recent Labs  Lab 07/16/17 1120 07/16/17 1518 07/16/17 1945 07/16/17 2326 07/17/17 0321 07/17/17 0746  GLUCAP 149* 147* 257* 256* 277* 298*    Imaging Mr Brain Wo Contrast  Result Date: 07/16/2017 CLINICAL DATA:  Initial evaluation for acute altered mental status. EXAM: MRI HEAD WITHOUT CONTRAST TECHNIQUE: Multiplanar, multiecho pulse sequences of the brain and surrounding structures were obtained without intravenous contrast. COMPARISON:  None. FINDINGS: Brain: Generalized age-related cerebral atrophy. Mild chronic small vessel ischemic change present the periventricular white matter. Small remote lacunar infarct present within the right ventral pons. No abnormal foci of restricted diffusion to suggest acute or subacute ischemia. Gray-white matter differentiation maintained. No other areas of remote cortical infarction. Small amount of chronic hemosiderin staining at the left periatrial white matter. No evidence for acute intracranial hemorrhage. No mass lesion, midline shift or mass effect. No hydrocephalus. No extra-axial fluid collection. Pituitary gland within normal limits. Vascular: Major intracranial vascular flow voids maintained at the skull base. Skull and upper cervical spine: Craniocervical junction normal. No focal marrow replacing lesion. Scalp soft tissues unremarkable. Sinuses/Orbits: Globes and orbital soft tissues demonstrate no acute finding. Paranasal sinuses are clear. Small bilateral mastoid effusions, right slightly larger than left, of doubtful significance. Other: None. IMPRESSION: 1. No acute intracranial abnormality. 2. Small remote ventral right pontine infarct. 3. Mild chronic small vessel ischemic disease. Electronically Signed   By: Rise Mu M.D.   On: 07/16/2017 22:32   Dg Abd Portable 1v  Result Date: 07/17/2017 CLINICAL DATA:  Abdominal distention EXAM: PORTABLE ABDOMEN - 1 VIEW COMPARISON:  None. FINDINGS: NG tube tip is in the mid  stomach. Relative paucity of gas throughout the abdomen. No free air or organomegaly. Degenerative changes in the lumbar spine. IMPRESSION: NG tube tip in the mid stomach. Paucity of gas throughout the abdomen. Nonspecific bowel gas pattern. Electronically Signed   By: Charlett Nose M.D.   On: 07/17/2017 08:28     STUDIES:  6/15  CTH >> small vessel ischemic disease, no acute abnormality  6/16  EEG >> abnormal due to the presence of moderate diffuse background slowing with triphasic waves. This is non-specific in etiology and can be seen with hypoxic/ischemic injury, toxic/metabolic encephalopathies, neurodegenerative disorders, or medication effect.  6/19 MRI brain>>> 1. No acute intracranial abnormality. 2. Small remote ventral right pontine infarct. 3. Mild chronic small vessel ischemic disease.  CULTURES: BCx2 6/15 >>  Tracheal aspirate 6/15 >>abundant GNC, mod GPC>>> H Flu, Beta Lactamase Negative  ANTIBIOTICS: Vancomycin 6/15 >>6/18 Zosyn 6/16 >> 6/18 Ceftriaxone 6/18>>>  SIGNIFICANT EVENTS: 6/15  Admit with AMS, found down at home, ? narc contribution, intubated in ER  LINES/TUBES: ETT 6/15 >> R chest perm cath (pta)>>>  DISCUSSION: 72 year old male history of end-stage renal disease on hemodialysis with acute respiratory failure, AMS, aspiration PNA.     ASSESSMENT / PLAN:  PULMONARY A: Acute respiratory failure  PNA - H Flu  P:   Vent support - 8cc/kg  F/u CXR  F/u ABG Ok for PS wean as tol - no extubation until mental status improves  abx as above D5/8   CARDIOVASCULAR A:  Irregular rhtyhm - ? type II mobitz. Suspect r/t metabolic derangements.  Improving.  H/o HTN -  Echo EF 55%,  P:  Telemetry  F/u chem  No anticoagulation for now    RENAL A:   ESRD with uremia  Hyperkalemia HD Catheter malfunction  P:   Renal following  For HD today  F/u chem  If able to use fistula again for HD today would consider d/c perm cath - not functioning for CRRT,  old, wbc rising   GASTROINTESTINAL A:   Reported vomiting  abd distension - KUB ok  P:   NPO / OGT  Continue TF  Pepcid for SUP  Continue lactulose  flexiseal   HEMATOLOGIC A:   No active issues P: Trend CBC  Moitor for active bleeding SQ heparin   INFECTIOUS A:   leukocytosis  Fever -- improved  P:   Follow blood cultures  abx as above D5/8  Consider d/c perm cath as above  Trend pct - slowly improving (ESRD)    ENDOCRINE A:   Diabetes mellitus   P: CBG Q 4   SSI  lantus   NEUROLOGIC A:   Acute Metabolic Encephalopathy - unclear etiology -- suspected uremia/hepatic encephalopathy but no improvement with HD, lactulose. MRI neg.  Mental status not significantly improved 6/19 despite CRRT, HD, improved ammonia  MRI neg acute 6/19 P:   RASS goal: 0  Remains off sedation x 72 hrs  Consider neuro input   Continue lactulose  HD as above   FAMILY  - Updates:  No family at bedside 6/20.   SW   - Inter-disciplinary family meet or Palliative Care meeting due by Day 7    Dirk DressKaty Raylin Diguglielmo, NP 07/17/2017  9:29 AM Pager: [phone] or [phone]

## 2017-07-18 ENCOUNTER — Inpatient Hospital Stay (HOSPITAL_COMMUNITY): Payer: Medicare Other

## 2017-07-18 DIAGNOSIS — I2699 Other pulmonary embolism without acute cor pulmonale: Secondary | ICD-10-CM

## 2017-07-18 LAB — RENAL FUNCTION PANEL
ALBUMIN: 1.8 g/dL — AB (ref 3.5–5.0)
ANION GAP: 34 — AB (ref 5–15)
Albumin: 1.7 g/dL — ABNORMAL LOW (ref 3.5–5.0)
Anion gap: 31 — ABNORMAL HIGH (ref 5–15)
BUN: 54 mg/dL — AB (ref 6–20)
BUN: 68 mg/dL — ABNORMAL HIGH (ref 6–20)
CHLORIDE: 88 mmol/L — AB (ref 101–111)
CO2: 15 mmol/L — ABNORMAL LOW (ref 22–32)
CO2: 19 mmol/L — ABNORMAL LOW (ref 22–32)
CREATININE: 5.92 mg/dL — AB (ref 0.61–1.24)
Calcium: 8.4 mg/dL — ABNORMAL LOW (ref 8.9–10.3)
Calcium: 8.3 mg/dL — ABNORMAL LOW (ref 8.9–10.3)
Chloride: 93 mmol/L — ABNORMAL LOW (ref 101–111)
Creatinine, Ser: 6.69 mg/dL — ABNORMAL HIGH (ref 0.61–1.24)
GFR calc Af Amer: 10 mL/min — ABNORMAL LOW (ref >60)
GFR calc non Af Amer: 9 mL/min — ABNORMAL LOW (ref >60)
GFR, EST AFRICAN AMERICAN: 9 mL/min — AB (ref >60)
GFR, EST NON AFRICAN AMERICAN: 7 mL/min — AB (ref >60)
Glucose, Bld: 186 mg/dL — ABNORMAL HIGH (ref 65–99)
Glucose, Bld: 242 mg/dL — ABNORMAL HIGH (ref 65–99)
PHOSPHORUS: 8.8 mg/dL — AB (ref 2.5–4.6)
POTASSIUM: 3.6 mmol/L (ref 3.5–5.1)
POTASSIUM: 3.9 mmol/L (ref 3.5–5.1)
Phosphorus: 11.2 mg/dL — ABNORMAL HIGH (ref 2.5–4.6)
Sodium: 139 mmol/L (ref 135–145)
Sodium: 141 mmol/L (ref 135–145)

## 2017-07-18 LAB — CBC
HEMATOCRIT: 24.3 % — AB (ref 39.0–52.0)
HEMATOCRIT: 23.8 % — AB (ref 39.0–52.0)
Hemoglobin: 8.2 g/dL — ABNORMAL LOW (ref 13.0–17.0)
Hemoglobin: 8.1 g/dL — ABNORMAL LOW (ref 13.0–17.0)
MCH: 26.4 pg (ref 26.0–34.0)
MCH: 26.6 pg (ref 26.0–34.0)
MCHC: 33.7 g/dL (ref 30.0–36.0)
MCHC: 34 g/dL (ref 30.0–36.0)
MCV: 78.1 fL (ref 78.0–100.0)
MCV: 78.3 fL (ref 78.0–100.0)
PLATELETS: 146 10*3/uL — AB (ref 150–400)
Platelets: 144 10*3/uL — ABNORMAL LOW (ref 150–400)
RBC: 3.11 MIL/uL — ABNORMAL LOW (ref 4.22–5.81)
RBC: 3.04 MIL/uL — ABNORMAL LOW (ref 4.22–5.81)
RDW: 16.2 % — AB (ref 11.5–15.5)
RDW: 16.1 % — AB (ref 11.5–15.5)
WBC: 26.3 10*3/uL — AB (ref 4.0–10.5)
WBC: 23.1 10*3/uL — ABNORMAL HIGH (ref 4.0–10.5)

## 2017-07-18 LAB — POCT I-STAT 3, ART BLOOD GAS (G3+)
ACID-BASE EXCESS: 3 mmol/L — AB (ref 0.0–2.0)
BICARBONATE: 25.4 mmol/L (ref 20.0–28.0)
O2 Saturation: 98 %
PH ART: 7.516 — AB (ref 7.350–7.450)
PO2 ART: 99 mmHg (ref 83.0–108.0)
Patient temperature: 100.6
TCO2: 26 mmol/L (ref 22–32)
pCO2 arterial: 31.6 mmHg — ABNORMAL LOW (ref 32.0–48.0)

## 2017-07-18 LAB — GLUCOSE, CAPILLARY
GLUCOSE-CAPILLARY: 136 mg/dL — AB (ref 65–99)
GLUCOSE-CAPILLARY: 168 mg/dL — AB (ref 65–99)
GLUCOSE-CAPILLARY: 172 mg/dL — AB (ref 65–99)
GLUCOSE-CAPILLARY: 199 mg/dL — AB (ref 65–99)
GLUCOSE-CAPILLARY: 219 mg/dL — AB (ref 65–99)
Glucose-Capillary: 192 mg/dL — ABNORMAL HIGH (ref 65–99)

## 2017-07-18 LAB — APTT
aPTT: 77 seconds — ABNORMAL HIGH (ref 24–36)
aPTT: 91 seconds — ABNORMAL HIGH (ref 24–36)
aPTT: 88 seconds — ABNORMAL HIGH (ref 24–36)
aPTT: 82 seconds — ABNORMAL HIGH (ref 24–36)

## 2017-07-18 LAB — PROCALCITONIN: Procalcitonin: 51.54 ng/mL

## 2017-07-18 LAB — PROTIME-INR
INR: 2.28
INR: 2.4
PROTHROMBIN TIME: 24.9 s — AB (ref 11.4–15.2)
PROTHROMBIN TIME: 25.9 s — AB (ref 11.4–15.2)

## 2017-07-18 LAB — DIC (DISSEMINATED INTRAVASCULAR COAGULATION)PANEL
D-Dimer, Quant: >20 ug/mL-FEU — ABNORMAL HIGH (ref 0.00–0.50)
Fibrinogen: >800 mg/dL — ABNORMAL HIGH (ref 210–475)
Platelets: 147 10*3/uL — ABNORMAL LOW (ref 150–400)
Smear Review: NONE SEEN

## 2017-07-18 LAB — MAGNESIUM: Magnesium: 2.4 mg/dL (ref 1.7–2.4)

## 2017-07-18 LAB — DIC (DISSEMINATED INTRAVASCULAR COAGULATION) PANEL
APTT: 65 s — AB (ref 24–36)
INR: 2.34
PROTHROMBIN TIME: 25.5 s — AB (ref 11.4–15.2)

## 2017-07-18 MED ORDER — IOPAMIDOL (ISOVUE-370) INJECTION 76%
INTRAVENOUS | Status: AC
  Filled 2017-07-18: qty 100

## 2017-07-18 MED ORDER — HEPARIN (PORCINE) IN NACL 100-0.45 UNIT/ML-% IJ SOLN
1300.0000 [IU]/h | INTRAMUSCULAR | Status: DC
Start: 2017-07-18 — End: 2017-07-22
  Administered 2017-07-18: 1100 [IU]/h via INTRAVENOUS
  Administered 2017-07-19 – 2017-07-22 (×4): 1300 [IU]/h via INTRAVENOUS
  Filled 2017-07-18 (×4): qty 250

## 2017-07-18 MED ORDER — SODIUM CHLORIDE 0.9 % IV SOLN
250.0000 mL | Freq: Once | INTRAVENOUS | Status: AC
  Administered 2017-07-18: 250 mL via INTRAVENOUS

## 2017-07-18 MED ORDER — ALTEPLASE (PULMONARY EMBOLISM) INFUSION
50.0000 mg | Freq: Once | INTRAVENOUS | Status: AC
  Administered 2017-07-18: 50 mg via INTRAVENOUS
  Filled 2017-07-18: qty 50

## 2017-07-18 MED ORDER — IOPAMIDOL (ISOVUE-370) INJECTION 76%
100.0000 mL | Freq: Once | INTRAVENOUS | Status: AC | PRN
  Administered 2017-07-18: 100 mL via INTRAVENOUS

## 2017-07-18 MED ORDER — HEPARIN (PORCINE) IN NACL 100-0.45 UNIT/ML-% IJ SOLN
1150.0000 [IU]/h | INTRAMUSCULAR | Status: DC
  Administered 2017-07-18: 1150 [IU]/h via INTRAVENOUS
  Filled 2017-07-18: qty 250

## 2017-07-18 NOTE — Progress Notes (Signed)
eLink Physician-Brief Progress Note Patient Name: [name] LemonsSylvester Dudley DOB: 08/27/1945 MRN: [id number]  Call from radiology - patient has LLL PE with infract  A This is massive PE with PESI score 5 and circulatory shock RN says patient on SQ heparin. Not on IV heparin. Only bleeding is mild ooze from various sites. DIC panel sent. Patient has had CPR  P Will call Dr Carlota RaspberryScatliffe to bedside to make assessment about low dose lytic v full dose lytics v IV heparin v supportive care     Intervention Category Major Interventions: Other:  [name] Dudley 07/18/2017, 4:35 AM

## 2017-07-18 NOTE — Progress Notes (Signed)
ANTICOAGULATION CONSULT NOTE - Initial Consult  Pharmacy Consult for Heparin Indication: pulmonary embolus  Allergies  Allergen Reactions  . Penicillins Nausea And Vomiting and Rash    Has patient had a PCN reaction causing immediate rash, facial/tongue/throat swelling, SOB or lightheadedness with hypotension: No Has patient had a PCN reaction causing severe rash involving mucus membranes or skin necrosis: No Has patient had a PCN reaction that required hospitalization: No Has patient had a PCN reaction occurring within the last 10 years: No If all of the above answers are "NO", then may proceed with Cephalosporin use.     Patient Measurements: Height: 5\' 7"  (170.2 cm) Weight: 181 lb 3.5 oz (82.2 kg) IBW/kg (Calculated) : 66.1 Heparin Dosing Weight: 82.2 kg  Vital Signs: Temp: 98.3 F (36.8 C) (06/21 1939) Temp Source: Oral (06/21 1939) BP: 109/59 (06/21 1800) Pulse Rate: 94 (06/21 1700)  Labs: Recent Labs    07/17/17 1948 07/18/17 0345 07/18/17 0922 07/18/17 1354 07/18/17 1529 07/18/17 1730  HGB 8.0* 8.2* 8.1*  --   --   --   HCT 25.8* 24.3* 23.8*  --   --   --   PLT 156 147*  146* 144*  --   --   --   APTT  --  65* 77* 91* 88* 82*  LABPROT  --  25.5*  24.9* 25.9*  --   --   --   INR  --  2.34  2.28 2.40  --   --   --   CREATININE 5.35* 5.92*  --   --  6.69*  --     Estimated Creatinine Clearance: 10.2 mL/min (A) (by C-G formula based on SCr of 6.69 mg/dL (H)).   Medical History: Past Medical History:  Diagnosis Date  . Diabetes mellitus without complication (HCC)   . Hypertension   . Neuropathy   . Renal disorder     Assessment: 72 year old male with PE on CT angio now s/p low dose tPA 50mg  around 11:30 AM. APTT down to 82 at 1730 PM and trending down nicely. Will start IV Heparin as anticipate now <80, 2 hours post that dose. No overt bleeding noted. Patient has ESRD - last HD on 6/20. H/H is low but stable. Platelets are down to 144 in setting of  recent tPA.   Goal of Therapy:  Heparin level 0.3 to 0.5 units/ml for 24 hours then increase to 0.3 to 0.7 units/ml.  Monitor platelets by anticoagulation protocol: Yes   Plan:  Start IV Heparin at 1100 units/hr (no bolus) at 08:30PM.  Heparin level in 8 hours.  Daily heparin level and CBC while on therapy.  Notify pharmacy if any bleeding noted.   Link SnufferJessica Carmelina Balducci, PharmD, BCPS, BCCCP Clinical Pharmacist Clinical phone 07/18/2017 until 10:30PM [phone]- [phone] After hours, please call [phone] 07/18/2017,7:42 PM

## 2017-07-18 NOTE — Progress Notes (Signed)
CSW spoke with pt's daughter, son, and granddaughter at bedside. Daughter expressed no concerns to CSW at this time and voiced that she only wants to be here with father until father is better and able to communicate with her. CSW will continue to follow for further needs at this time.    Claude MangesKierra S. Doye Montilla, MSW, LCSW-A Emergency Department Clinical Social Worker [phone]

## 2017-07-18 NOTE — Progress Notes (Signed)
eLink Physician-Brief Progress Note Patient Name: [name] LemonsSylvester Dudley DOB: 1946-01-16 MRN: [id number]   Date of Service  07/18/2017  HPI/Events of Note  RN says ooziing of blood from diff sources  eICU Interventions  Check dic panel Await cta result (done at this stage)     Intervention Category Major Interventions: Other:  Kalman ShanMurali Heydy Montilla 07/18/2017, 3:08 AM

## 2017-07-18 NOTE — Progress Notes (Signed)
eLink Physician-Brief Progress Note Patient Name: [name] LemonsSylvester Dudley DOB: Oct 05, 1945 MRN: [id number]   Date of Service  07/18/2017  HPI/Events of Note  Dw/. RN - still In reverse trendelenburg. High dose bic gtt, vasopression, levophed 15, dopamine 10  eICU Interventions  Not stable yet for CTA RN to reduce dopamine first        [name] 07/18/2017, 12:15 AM

## 2017-07-18 NOTE — Progress Notes (Signed)
[facility]  Afternoon rounds:  doing better, BP improved No bleeding Resume tube feeding Wean off vasopressors   Heber CarolinaBrent McQuaid, MD [facility] Pager: [phone] Cell: [phone] After 3pm or if no response, call [phone]

## 2017-07-18 NOTE — Progress Notes (Signed)
PULMONARY / CRITICAL CARE MEDICINE   Name: Kasper Mudrick MRN: [id number] DOB: [birth year]    ADMISSION DATE:  2017-07-18 CONSULTATION DATE:  18-Jul-2017  REFERRING MD:  Meridee Score, MD  CHIEF COMPLAINT:  Unresponsive, found down  HISTORY OF PRESENT ILLNESS:   72 y/o male with ESRD on HD presented on 6/15 after a wellness check found him down.  The initial presentation was concerning for a possible narcotic overdose.  He was treated for pneumonia and required vasopressors.  His mental status didn't improve so an MRI brain was ordered which showed nothing acute.  While receiving hemodialysis on 6/20 he had a cardiac arrest due to a PE.    SUBJECTIVE:   Had a cardiac arrest yesterday Minimal neurologic improvement Some improvement in hemodynamics but remains on vasopressors  VITAL SIGNS: BP (!) 110/48 (BP Location: Right Leg)   Pulse 84   Temp 98.5 F (36.9 C) (Oral)   Resp (!) 36   Ht 5\' 7"  (1.702 m)   Wt 82.2 kg (181 lb 3.5 oz)   SpO2 100%   BMI 28.38 kg/m   HEMODYNAMICS:    VENTILATOR SETTINGS: Vent Mode: PRVC FiO2 (%):  [40 %-100 %] 100 % Set Rate:  [18 bmp] 18 bmp Vt Set:  [620 mL] 620 mL PEEP:  [5 cmH20] 5 cmH20 Pressure Support:  [5 cmH20] 5 cmH20 Plateau Pressure:  [19 cmH20-28 cmH20] 19 cmH20  INTAKE / OUTPUT: I/O last 3 completed shifts: In: 4142.5 [I.V.:2217.4; Other:50; NG/GT:1575; IV Piggyback:300.1] Out: 3850 [Emesis/NG output:300; Stool:3550]  PHYSICAL EXAMINATION:  General:  In bed on vent HENT: NCAT ETT in place PULM: CTA B, vent supported breathing CV: Irreg irreg, no mgr GI: BS+, soft, nontender MSK: normal bulk and tone Neuro: will shrug shoulders (R>L) to painful stimuli    LABS:  BMET Recent Labs  Lab 07/17/17 0913 07/17/17 1948 07/18/17 0345  NA 140 142 139  K 3.5 4.3 3.6  CL 100* 98* 93*  CO2 22 13* 15*  BUN 80* 42* 54*  CREATININE 7.94* 5.35* 5.92*  GLUCOSE 333* 65 186*    Electrolytes Recent Labs  Lab 07/17/17 0913  07/17/17 1948 07/18/17 0345  CALCIUM 9.2 10.3 8.4*  MG 2.6* 3.0* 2.4  PHOS 7.2* 13.4* 8.8*    CBC Recent Labs  Lab 07/17/17 0913 07/17/17 1948 07/18/17 0345  WBC 15.9* 41.4* 26.3*  HGB 8.6* 8.0* 8.2*  HCT 25.5* 25.8* 24.3*  PLT 168 156 147*  146*    Coag's Recent Labs  Lab 07/18/17 1726 07/14/17 0628 07/16/17 0454 07/18/17 0345  APTT  --  28 51* 65*  INR 1.28  --   --  2.34  2.28    Sepsis Markers Recent Labs  Lab 07/14/17 0040 07/14/17 0452 07/14/17 0628 07/16/17 1232 07/17/17 0913 07/18/17 0345  LATICACIDVEN 5.4* 3.9* 2.1*  --   --   --   PROCALCITON  --   --  89.97 71.75 58.46 51.54    ABG Recent Labs  Lab 2017/07/18 1812 07/17/17 1930 07/17/17 2337  PHART 7.366 7.045* 7.385  PCO2ART 39.6 44.0 28.8*  PO2ART 152.0* 122* 185.0*    Liver Enzymes Recent Labs  Lab July 18, 2017 1726  07/17/17 0913 07/17/17 1948 07/18/17 0345  AST 45*  --   --   --   --   ALT 15*  --   --   --   --   ALKPHOS 85  --   --   --   --   BILITOT  0.7  --   --   --   --   ALBUMIN 2.7*   < > 1.7* 2.1* 1.8*   < > = values in this interval not displayed.    Cardiac Enzymes Recent Labs  Lab 07/13/17 1522  TROPONINI 0.30*    Glucose Recent Labs  Lab 07/17/17 1212 07/17/17 1546 07/17/17 2027 07/17/17 2320 07/18/17 0349 07/18/17 0745  GLUCAP 256* 175* 79 147* 192* 136*    Imaging Ct Angio Chest Pe W Or Wo Contrast  Result Date: 07/18/2017 CLINICAL DATA:  Pulmonary embolus suspected. High pretest probability. History of diabetes. EXAM: CT ANGIOGRAPHY CHEST WITH CONTRAST TECHNIQUE: Multidetector CT imaging of the chest was performed using the standard protocol during bolus administration of intravenous contrast. Multiplanar CT image reconstructions and MIPs were obtained to evaluate the vascular anatomy. CONTRAST:  ISOVUE-370 IOPAMIDOL (ISOVUE-370) INJECTION 76% COMPARISON:  None. FINDINGS: Cardiovascular: Good opacification of the central and segmental  pulmonary arteries. Filling defects in the left lower lobe pulmonary arteries consistent with acute pulmonary embolus. Normal caliber thoracic aorta without evidence of dissection. Great vessel origins are patent. Aortic and coronary artery calcifications. Normal heart size. No pericardial effusion. Mediastinum/Nodes: Endotracheal and enteric tubes are present. Endotracheal tube tip is above the carina. Enteric tube tip is in the stomach. Esophagus is decompressed. No significant lymphadenopathy. Thyroid gland is unremarkable. Lungs/Pleura: Consolidation or atelectasis in both lung bases. Decreased enhancement of the consolidated area in the left lung base with some cystic change likely indicates pulmonary infarct. No pleural effusions. No pneumothorax. Emphysematous changes in the upper lungs. Patchy areas of airspace disease throughout the lungs with bronchial opacification. This could be due to bronchopneumonia or aspiration. Upper Abdomen: No acute abnormality. Musculoskeletal: Degenerative changes in the spine. Diffuse bone sclerosis could indicate metabolic bone disease such as renal osteodystrophy or may be related to sickle cell disease. Multiple Schmorl's nodes are present. Lucent mildly expansile lesion in the spinous process of T1 possibly representing bone cyst. Nonaggressive appearance is suggested. Review of the MIP images confirms the above findings. IMPRESSION: 1. Positive examination for pulmonary embolus with filling defects in the left lower lobe pulmonary arteries. 2. Consolidation or atelectasis in both lung bases. Probable left lower lobe pulmonary infarct. 3. Patchy airspace disease in both lungs with bronchial opacification. This could be due to bronchopneumonia or aspiration. 4. Diffuse bone sclerosis may indicate metabolic bone disease, renal osteodystrophy, or sickle cell disease. Diffuse metastasis could also have this appearance if the patient has a history of primary neoplasm. Aortic  Atherosclerosis (ICD10-I70.0) and Emphysema (ICD10-J43.9). These results were called by telephone at the time of interpretation on 07/18/2017 at 2:51 am to Dr. Kalman Shan , who verbally acknowledged these results. Electronically Signed   By: Burman Nieves M.D.   On: 07/18/2017 03:14   Dg Chest Portable 1 View  Result Date: 07/18/2017 CLINICAL DATA:  Respiratory failure. EXAM: PORTABLE CHEST 1 VIEW COMPARISON:  CT 07/18/2017.  Chest x-ray 07/17/2017. FINDINGS: Endotracheal tube, NG tube, dual-lumen right IJ catheter in stable position. Heart size stable. Persistent bibasilar and left mid lung field atelectatic changes. Tiny left pleural effusion. No pneumothorax. Sclerotic bony changes best identified by prior CT. IMPRESSION: 1.  Lines and tubes in stable position.  No pneumothorax. 2. Persistent bibasilar and left mid lung field atelectatic changes. Tiny left pleural effusion. Heart size normal. Similar findings on prior exams. 3.  Sclerotic bony changes best identified by prior CT. Electronically Signed   By: Maisie Fus  Register  On: 07/18/2017 05:53   Dg Chest Port 1 View  Result Date: 07/17/2017 CLINICAL DATA:  Cardiac arrest. EXAM: PORTABLE CHEST 1 VIEW COMPARISON:  Chest radiograph July 16, 2017 at 0459 hours FINDINGS: Cardiomediastinal silhouette is normal, patient rotated to the RIGHT. Calcified aortic arch. Improved aeration LEFT lung base, bibasilar strandy densities. No pleural effusion. No pneumothorax. Endotracheal tube tip projects 2.5 cm above the carina. Tunneled dialysis catheter via RIGHT internal jugular venous approach distal tip projects in proximal RIGHT atrium. Nasogastric tube past GE junction, distal tip out of field of view. Soft tissue planes and included osseous structures are unchanged. IMPRESSION: Re-expanded LEFT lung base with bibasilar atelectasis. No apparent change in life support lines. Aortic Atherosclerosis (ICD10-I70.0). Electronically Signed   By: Awilda Metroourtnay   Bloomer M.D.   On: 07/17/2017 19:53     STUDIES:  6/15  CTH >> small vessel ischemic disease, no acute abnormality  6/16  EEG >> abnormal due to the presence of moderate diffuse background slowing with triphasic waves. This is non-specific in etiology and can be seen with hypoxic/ischemic injury, toxic/metabolic encephalopathies, neurodegenerative disorders, or medication effect.  6/19 MRI brain>>> 1. No acute intracranial abnormality. 2. Small remote ventral right pontine infarct. 3. Mild chronic small vessel ischemic disease. 6/20 CT angiogram>  Left lower lobe pulmonary embolism, cavitary lesion left lower lobe, possible infarct, atelectasis RLL  CULTURES: BCx2 6/15 >> Tracheal aspirate 6/15 >>abundant GNC, mod GPC>>> H Flu, Beta Lactamase Negative  ANTIBIOTICS: Vancomycin 6/15 >>6/18 Zosyn 6/16 >> 6/18 Ceftriaxone 6/18>>>  SIGNIFICANT EVENTS: 6/15  Admit with AMS, found down at home, ? narc contribution, intubated in ER  LINES/TUBES: ETT 6/15 >> R chest perm cath (pta)>>>  DISCUSSION: 72 year old male history of end-stage renal disease on hemodialysis with acute respiratory failure, AMS, aspiration PNA.   He has persistent encephalopathy which has been poorly understood.  On 6/20 he had a cardiac arrest due to a PE.     ASSESSMENT / PLAN:  PULMONARY A: Acute respiratory failure  PNA - H Flu  Pulmonary embolism P:   Full mechanical vent support VAP prevention Daily WUA/SBT  CARDIOVASCULAR A:  Irregular rhtyhm - ? type II mobitz. Suspect r/t metabolic derangements.  Improving.  H/o HTN -  Echo EF 55%,  Cardiogenic shock from massive pulmonary embolism; suspect embolism was from the perm cath P:  Tele Continue levophed and vasopressin TPA now > discussed risks and benefits with family and they are willing to proceed.  I explained that his risk of major bleeding is likely near 10% given his comorbid illnesses. Will reassess ability to exchange catheters after  administering TPA.  RENAL A:   ESRD with uremia  Hyperkalemia Relatively new fistula P:   Monitor BMET and UOP Replace electrolytes as needed Restart HD when able, hold off on further catheter placement until after TPA has been given  GASTROINTESTINAL A:   Reported vomiting  P:   Continue tube feeding Continue medical stress ulcer prophylaxis  HEMATOLOGIC A:   Acute pulmonary embolism P: Hold heparin for TPA administration Resume heparin afterwards  INFECTIOUS A:   Aspiration pneumonia  P:   Remains on ceftriaxone, plan 8 days  ENDOCRINE A:   Diabetes mellitus   P: CBG q4, SSI  NEUROLOGIC A:   Acute Metabolic Encephalopathy - unclear etiology -- suspected uremia/hepatic encephalopathy but no improvement with HD, lactulose. MRI neg.  Mental status not significantly improved 6/19 despite CRRT, HD, improved ammonia  MRI neg acute 6/19 P:  RASS goal 0 Remains off sedation x 72 hours May need neuro input continue lactulose Resume HD when able  FAMILY  - Updates: Updated family at length at bedside (son, daughter) on 6/21. I explained that his prognosis was poor prior to his cardiac arrest due to his multiple comorbid illnesses and persistent encephalopathy.  Now post cardiac arrest his prognosis is worse.  However he is not brain dead and at this point I don't believe has an incurable condition.  We will treat the massive PE with TPA and then will resume care plan outlined earlier in the week and plan neuro consult if no improvement.  - Inter-disciplinary family meet or Palliative Care meeting due by Day 7  My cc time 35 minutes  Heber [place], MD [facility] Pager: [phone] Cell: [phone] After 3pm or if no response, call [phone]

## 2017-07-18 NOTE — Significant Event (Signed)
.. ..    Name: [name] LemonsSylvester Avalos MRN: [id number] DOB: 11/27/1945    ADMISSION DATE:  07/16/2017  72 yr old male w/ PmHx of ESRD (LUE fistula and Right Permacath), IDDM, who was found down and believed to be secondary to narcotic admitted on 06/30/2017 Hyperkalemic started on CRRT converted to intermittent HD due to multiple clotting events involving right permacath despite use of TPA and heparin in the circuit.  Pt being managed for Acute metabolic encephalopathy with evidence of triphasic waves on EEG. Pt has had little improvement despite metabolic correction. MRI negative for any acute process.  Earlier this evening pt had a code blue following HD. During HD both arterial and venous access was established initially via fistula, per HD nurse she had difficulty with sufficient flow and  discontinued venous access from LUE fistula and established via Right sided permacath and gave the patient albumin for BP support. She noted that the pt became bradycardic and hypotensive prior to loss of pulses. Code lasted 16 minutes and pt received Atropine, Epi, Amio and bicarb and was started on Levophed with ROSC. Due to the high suspicion for air embolus vs PE pt was placed in Trendelenburg and rolled into left lateral decubitus.  Pt further stabilized with the addition of Vasopressin shock dose and Dopamine. ABG 7.045/44/122/11 showed AG Metabolic acidosis and pt received 2 pushes of bicarb and was started on a high dose bicarb ggt,  Repeat ABG 7.38/28/185/17.  CTA completed approx 3 AM and shows pulmonary embolus with filling defects in the left lower lobe pulmonary arteries On my most recent exam not on sedation, pt withdraws from painful stimuli, no corneal and can not elicit a gag, breathing over the vent.  Assessment:  Massive PE given the positive PE on CTA with criculatory shock s/p cardiac arrest and currently on vasopressors. Pt has had recent compressions, some bloody output from flexiseal and diffuse  bone sclerosis that may represent metaststatic disease seen on CT.  Plan: Discussed with inpatient pharmacy will start patient on Heparin ggt at 14mg /kg/hr for the treatment of PE. With no bolus.    I, Dr Newell CoralKristen Scatliffe have personally reviewed patient's available data, including medical history, events of note, physical examination and test results as part of my evaluation. I have discussed with other care providers such as pharmacist, RN and Elink. The patient is critically ill with multiple organ systems failure and requires high complexity decision making for assessment and support, frequent evaluation and titration of therapies, application of advanced monitoring technologies and extensive interpretation of multiple databases. Critical Care Time devoted to patient care services described in this note is an additional 40 Minutes. This time reflects time of care of this signee Dr Newell CoralKristen Scatliffe. This critical care time does not reflect procedure time, or teaching time or supervisory time but could involve care discussion time    Signed Dr Newell CoralKristen Scatliffe Pulmonary Critical Care Locums   07/18/2017, 4:51 AM

## 2017-07-18 NOTE — Progress Notes (Signed)
[facility] ROUNDING NOTE   Subjective:   72 year old male history of end-stage renal disease ( started Nov 2018) on hemodialysis via right PermCath, insulin-dependent diabetes mellitus who was found down on a wellness check. Police found him unresponsive with vomitus throughout his home and when EMS arrived he was noted to have pinpoint pupils.  There is also report of an empty pill bottle that actually belonged to his friend who normally gives him rides to dialysis, they stated that the name started with hydro-and they believed it to be a narcotic. In the emergency room the patient was found to be hyperkalemic and ws started on CRRT   The permcath has had poor function during CRRT and clotted multiple times 6/17 despite use of TPA and heparin in the circuit   It was decided to transition the patient to intermittent hemodialysis particularly as the patient is now hemodynamically stable and is no longer requiring pressors. EEG  findings indicates diffuse cerebral dysfunction that is non-specific in etiology and can be seen with hypoxic/ischemic injury, toxic/metabolic encephalopathies, neurodegenerative disorders, or medication effect.  Remains intubated. Attempts are being made to keep friends and family informed. Decisions about goals of care would be appropriate at this stage- it looks like we are heading to palliative care   Mental condition does not allow for intubation. Pupils are sluggish   During dialysis on 6/20 he developed an acute pulmonary embolism, PEA arrest and required CPR for 12 minutes. He had VFib and required dc cardioversion   He received TPA and is now on vasopressin, levophed and dopamine as pressor requirements       Objective:  Vital signs in last 24 hours:  Temp:  [98.5 F (36.9 C)-100.3 F (37.9 C)] 98.5 F (36.9 C) (06/21 0747) Pulse Rate:  [53-116] 89 (06/21 0900) Resp:  [16-38] 35 (06/21 0900) BP: (58-199)/(22-83) 127/42 (06/21 0900) SpO2:  [92  %-100 %] 94 % (06/21 0900) Arterial Line BP: (83-178)/(41-65) 119/43 (06/21 0800) FiO2 (%):  [40 %-100 %] 100 % (06/21 0400) Weight:  [181 lb 3.5 oz (82.2 kg)-182 lb 5.1 oz (82.7 kg)] 181 lb 3.5 oz (82.2 kg) (06/21 0400)  Weight change: 3.5 oz (0.1 kg) Filed Weights   07/17/17 0500 07/17/17 1430 07/18/17 0400  Weight: 182 lb 1.6 oz (82.6 kg) 182 lb 5.1 oz (82.7 kg) 181 lb 3.5 oz (82.2 kg)    Intake/Output: I/O last 3 completed shifts: In: 4142.5 [I.V.:2217.4; Other:50; NG/GT:1575; IV Piggyback:300.1] Out: 3850 [Emesis/NG output:300; Stool:3550]   Intake/Output this shift:  Total I/O In: 346.1 [I.V.:346.1] Out: 0   Unresponsive to voice  Withdraws to pain and has sluggish pupils  ETT  CVS- RRR   RS- CTA mechanical breath sounds on vent  ABD- BS present soft non-distended EXT- no edema LUE thrill  AVF    Basic Metabolic Panel: Recent Labs  Lab 07/16/17 0454  07/16/17 1232 07/16/17 1610 07/17/17 0913 07/17/17 1948 07/18/17 0345  NA 138  --   --  138 140 142 139  K 4.3  --   --  3.9 3.5 4.3 3.6  CL 98*  --   --  97* 100* 98* 93*  CO2 21*  --   --  22 22 13* 15*  GLUCOSE 164*  --   --  195* 333* 65 186*  BUN 43*  --   --  57* 80* 42* 54*  CREATININE 5.63*  --   --  6.63* 7.94* 5.35* 5.92*  CALCIUM 8.6*  --   --  9.2 9.2 10.3 8.4*  MG 2.1   < > 2.2 2.3 2.6* 3.0* 2.4  PHOS 6.6*   < > 7.1* 7.4*  7.4* 7.2* 13.4* 8.8*   < > = values in this interval not displayed.    Liver Function Tests: Recent Labs  Lab 07/02/2017 1726  07/16/17 0454 07/16/17 1610 07/17/17 0913 07/17/17 1948 07/18/17 0345  AST 45*  --   --   --   --   --   --   ALT 15*  --   --   --   --   --   --   ALKPHOS 85  --   --   --   --   --   --   BILITOT 0.7  --   --   --   --   --   --   PROT 8.8*  --   --   --   --   --   --   ALBUMIN 2.7*   < > 1.8* 1.8* 1.7* 2.1* 1.8*   < > = values in this interval not displayed.   Recent Labs  Lab 07/01/2017 2035  LIPASE 26   Recent Labs  Lab  07/15/17 1952  AMMONIA 23    CBC: Recent Labs  Lab 07/07/2017 1726  07/15/17 1157 07/16/17 0454 07/17/17 0913 07/17/17 1948 07/18/17 0345  WBC 3.7*   < > 15.0* 17.4* 15.9* 41.4* 26.3*  NEUTROABS 2.1  --   --   --   --   --   --   HGB 11.8*   < > 9.6* 9.2* 8.6* 8.0* 8.2*  HCT 36.7*   < > 28.2* 28.0* 25.5* 25.8* 24.3*  MCV 82.1   < > 77.7* 80.9 78.2 84.6 78.1  PLT 267   < > 168 158 168 156 147*  146*   < > = values in this interval not displayed.    Cardiac Enzymes: Recent Labs  Lab 07/23/2017 2150 07/13/17 1522  CKTOTAL 3,519*  --   TROPONINI  --  0.30*    BNP: Invalid input(s): POCBNP  CBG: Recent Labs  Lab 07/17/17 1546 07/17/17 2027 07/17/17 2320 07/18/17 0349 07/18/17 0745  GLUCAP 175* 79 147* 192* 136*    Microbiology: Results for orders placed or performed during the hospital encounter of 06/30/2017  Culture, respiratory (tracheal aspirate)     Status: None   Collection Time: 07/03/2017  7:50 PM  Result Value Ref Range Status   Specimen Description TRACHEAL ASPIRATE  Final   Special Requests NONE  Final   Gram Stain   Final    ABUNDANT WBC PRESENT, PREDOMINANTLY PMN ABUNDANT GRAM NEGATIVE COCCOBACILLI MODERATE GRAM POSITIVE COCCI    Culture   Final    ABUNDANT HAEMOPHILUS INFLUENZAE BETA LACTAMASE NEGATIVE Performed at [facility], [place] [address], [place], [place] [address]    Report Status 07/15/2017 FINAL  Final  Culture, blood (routine x 2)     Status: None   Collection Time: 07/23/2017  8:20 PM  Result Value Ref Range Status   Specimen Description BLOOD PORTA CATH  Final   Special Requests   Final    BOTTLES DRAWN AEROBIC AND ANAEROBIC Blood Culture adequate volume   Culture   Final    NO GROWTH 5 DAYS Performed at [facility], [place] [address] [place], [place] [address]    Report Status 07/17/2017 FINAL  Final  Culture, blood (routine x 2)  Status: None   Collection Time: 07/01/2017  8:35 PM  Result Value Ref Range Status    Specimen Description BLOOD RIGHT ANTECUBITAL  Final   Special Requests   Final    BOTTLES DRAWN AEROBIC AND ANAEROBIC Blood Culture adequate volume   Culture   Final    NO GROWTH 5 DAYS Performed at [facility], [address], [place], [place] [address]    Report Status 07/17/2017 FINAL  Final  MRSA PCR Screening     Status: None   Collection Time: 07/05/2017  9:14 PM  Result Value Ref Range Status   MRSA by PCR NEGATIVE NEGATIVE Final    Comment:        The GeneXpert MRSA Assay (FDA approved for NASAL specimens only), is one component of a comprehensive MRSA colonization surveillance program. It is not intended to diagnose MRSA infection nor to guide or monitor treatment for MRSA infections. Performed at [facility], [place] [address] [place], [place] [address]     Coagulation Studies: Recent Labs    07/18/17 0345  LABPROT 25.5*  24.9*  INR 2.34  2.28    Urinalysis: No results for input(s): COLORURINE, LABSPEC, PHURINE, GLUCOSEU, HGBUR, BILIRUBINUR, KETONESUR, PROTEINUR, UROBILINOGEN, NITRITE, LEUKOCYTESUR in the last 72 hours.  Invalid input(s): APPERANCEUR    Imaging: Ct Angio Chest Pe W Or Wo Contrast  Result Date: 07/18/2017 CLINICAL DATA:  Pulmonary embolus suspected. High pretest probability. History of diabetes. EXAM: CT ANGIOGRAPHY CHEST WITH CONTRAST TECHNIQUE: Multidetector CT imaging of the chest was performed using the standard protocol during bolus administration of intravenous contrast. Multiplanar CT image reconstructions and MIPs were obtained to evaluate the vascular anatomy. CONTRAST:  172m ISOVUE-370 IOPAMIDOL (ISOVUE-370) INJECTION 76% COMPARISON:  None. FINDINGS: Cardiovascular: Good opacification of the central and segmental pulmonary arteries. Filling defects in the left lower lobe pulmonary arteries consistent with acute pulmonary embolus. Normal caliber thoracic aorta without evidence of dissection. Great vessel origins are patent.  Aortic and coronary artery calcifications. Normal heart size. No pericardial effusion. Mediastinum/Nodes: Endotracheal and enteric tubes are present. Endotracheal tube tip is above the carina. Enteric tube tip is in the stomach. Esophagus is decompressed. No significant lymphadenopathy. Thyroid gland is unremarkable. Lungs/Pleura: Consolidation or atelectasis in both lung bases. Decreased enhancement of the consolidated area in the left lung base with some cystic change likely indicates pulmonary infarct. No pleural effusions. No pneumothorax. Emphysematous changes in the upper lungs. Patchy areas of airspace disease throughout the lungs with bronchial opacification. This could be due to bronchopneumonia or aspiration. Upper Abdomen: No acute abnormality. Musculoskeletal: Degenerative changes in the spine. Diffuse bone sclerosis could indicate metabolic bone disease such as renal osteodystrophy or may be related to sickle cell disease. Multiple Schmorl's nodes are present. Lucent mildly expansile lesion in the spinous process of T1 possibly representing bone cyst. Nonaggressive appearance is suggested. Review of the MIP images confirms the above findings. IMPRESSION: 1. Positive examination for pulmonary embolus with filling defects in the left lower lobe pulmonary arteries. 2. Consolidation or atelectasis in both lung bases. Probable left lower lobe pulmonary infarct. 3. Patchy airspace disease in both lungs with bronchial opacification. This could be due to bronchopneumonia or aspiration. 4. Diffuse bone sclerosis may indicate metabolic bone disease, renal osteodystrophy, or sickle cell disease. Diffuse metastasis could also have this appearance if the patient has a history of primary neoplasm. Aortic Atherosclerosis (ICD10-I70.0) and Emphysema (ICD10-J43.9). These results were called by telephone at the time of interpretation on  07/18/2017 at 2:51 am to Dr. Brand Males , who verbally acknowledged these  results. Electronically Signed   By: Lucienne Capers M.D.   On: 07/18/2017 03:14   Mr Brain Wo Contrast  Result Date: 07/16/2017 CLINICAL DATA:  Initial evaluation for acute altered mental status. EXAM: MRI HEAD WITHOUT CONTRAST TECHNIQUE: Multiplanar, multiecho pulse sequences of the brain and surrounding structures were obtained without intravenous contrast. COMPARISON:  None. FINDINGS: Brain: Generalized age-related cerebral atrophy. Mild chronic small vessel ischemic change present the periventricular white matter. Small remote lacunar infarct present within the right ventral pons. No abnormal foci of restricted diffusion to suggest acute or subacute ischemia. Gray-white matter differentiation maintained. No other areas of remote cortical infarction. Small amount of chronic hemosiderin staining at the left periatrial white matter. No evidence for acute intracranial hemorrhage. No mass lesion, midline shift or mass effect. No hydrocephalus. No extra-axial fluid collection. Pituitary gland within normal limits. Vascular: Major intracranial vascular flow voids maintained at the skull base. Skull and upper cervical spine: Craniocervical junction normal. No focal marrow replacing lesion. Scalp soft tissues unremarkable. Sinuses/Orbits: Globes and orbital soft tissues demonstrate no acute finding. Paranasal sinuses are clear. Small bilateral mastoid effusions, right slightly larger than left, of doubtful significance. Other: None. IMPRESSION: 1. No acute intracranial abnormality. 2. Small remote ventral right pontine infarct. 3. Mild chronic small vessel ischemic disease. Electronically Signed   By: Jeannine Boga M.D.   On: 07/16/2017 22:32   Dg Chest Portable 1 View  Result Date: 07/18/2017 CLINICAL DATA:  Respiratory failure. EXAM: PORTABLE CHEST 1 VIEW COMPARISON:  CT 07/18/2017.  Chest x-ray 07/17/2017. FINDINGS: Endotracheal tube, NG tube, dual-lumen right IJ catheter in stable position. Heart  size stable. Persistent bibasilar and left mid lung field atelectatic changes. Tiny left pleural effusion. No pneumothorax. Sclerotic bony changes best identified by prior CT. IMPRESSION: 1.  Lines and tubes in stable position.  No pneumothorax. 2. Persistent bibasilar and left mid lung field atelectatic changes. Tiny left pleural effusion. Heart size normal. Similar findings on prior exams. 3.  Sclerotic bony changes best identified by prior CT. Electronically Signed   By: Marcello Moores  Register   On: 07/18/2017 05:53   Dg Chest Port 1 View  Result Date: 07/17/2017 CLINICAL DATA:  Cardiac arrest. EXAM: PORTABLE CHEST 1 VIEW COMPARISON:  Chest radiograph July 16, 2017 at 0459 hours FINDINGS: Cardiomediastinal silhouette is normal, patient rotated to the RIGHT. Calcified aortic arch. Improved aeration LEFT lung base, bibasilar strandy densities. No pleural effusion. No pneumothorax. Endotracheal tube tip projects 2.5 cm above the carina. Tunneled dialysis catheter via RIGHT internal jugular venous approach distal tip projects in proximal RIGHT atrium. Nasogastric tube past GE junction, distal tip out of field of view. Soft tissue planes and included osseous structures are unchanged. IMPRESSION: Re-expanded LEFT lung base with bibasilar atelectasis. No apparent change in life support lines. Aortic Atherosclerosis (ICD10-I70.0). Electronically Signed   By: Elon Alas M.D.   On: 07/17/2017 19:53   Dg Abd Portable 1v  Result Date: 07/17/2017 CLINICAL DATA:  Abdominal distention EXAM: PORTABLE ABDOMEN - 1 VIEW COMPARISON:  None. FINDINGS: NG tube tip is in the mid stomach. Relative paucity of gas throughout the abdomen. No free air or organomegaly. Degenerative changes in the lumbar spine. IMPRESSION: NG tube tip in the mid stomach. Paucity of gas throughout the abdomen. Nonspecific bowel gas pattern. Electronically Signed   By: Rolm Baptise M.D.   On: 07/17/2017 08:28     Medications:   .  sodium chloride  10 mL/hr at 07/17/17 2000  . sodium chloride    . sodium chloride    . sodium chloride    . sodium chloride    . sodium chloride    . sodium chloride    . albumin human 25 g (07/17/17 1737)  . alteplase    . cefTRIAXone (ROCEPHIN)  IV Stopped (07/17/17 2304)  . DOPamine Stopped (07/18/17 0600)  . famotidine (PEPCID) IV Stopped (07/17/17 2343)  . feeding supplement (VITAL AF 1.2 CAL) Stopped (07/17/17 1845)  . norepinephrine (LEVOPHED) Adult infusion 10 mcg/min (07/18/17 0900)  .  sodium bicarbonate  infusion 1000 mL 125 mL/hr at 07/18/17 0900  . vasopressin (PITRESSIN) infusion - *FOR SHOCK* 0.03 Units/min (07/18/17 0200)   . chlorhexidine gluconate (MEDLINE KIT)  15 mL Mouth Rinse BID  . Chlorhexidine Gluconate Cloth  6 each Topical Q0600  . Chlorhexidine Gluconate Cloth  6 each Topical Q0600  . darbepoetin (ARANESP) injection - DIALYSIS  100 mcg Intravenous Q Thu-HD  . feeding supplement (PRO-STAT SUGAR FREE 64)  30 mL Per Tube Daily  . insulin aspart  0-20 Units Subcutaneous Q4H  . insulin glargine  15 Units Subcutaneous Daily  . lactulose  30 g Oral TID  . mouth rinse  15 mL Mouth Rinse 10 times per day   sodium chloride, sodium chloride, sodium chloride, sodium chloride, sodium chloride, Place/Maintain arterial line **AND** sodium chloride, acetaminophen (TYLENOL) oral liquid 160 mg/5 mL, albumin human, alteplase, docusate, fentaNYL (SUBLIMAZE) injection, heparin, lidocaine (PF), lidocaine-prilocaine, pentafluoroprop-tetrafluoroeth  Assessment/ Plan:   1. End stage renal disease    Did not complete dialysis yesterday due to cardiac arrest and massive pulmonary embolus 6/20 now on pressor medications and doubt that he could tolerate intermittent hemodialysis  2. Acute respiratory Failure   Treated with vanc and zosyn  Now stopped  White count continues to increase   Mental status making weaning off ventilator difficult 3. Catheter malfunction     Has a fistula  But if a decision  is made to continue dialysis then he will need CRRT and placement of a dialysis catheter   We will need to coordinate with Critical care and find out what the goals of care are.  4. Diabetes stable  5. Acute encephalopathy  EEG as above and moving towards establishing goals of care  6. Anemia  Decreased hemoglobin will start darbepoietin   Iron stores Tsats 61 %  ( 6/20) 7. Pulmonary Embolus and cardiac arrest  tpa administered   8. Hypotension    IV pressors   Dopamine  Vasopressin and Norepinephrine    LOS: 6 Jillisa Harris W _0 _1 :40 AM

## 2017-07-19 ENCOUNTER — Inpatient Hospital Stay (HOSPITAL_COMMUNITY): Payer: Medicare Other

## 2017-07-19 DIAGNOSIS — J9621 Acute and chronic respiratory failure with hypoxia: Secondary | ICD-10-CM

## 2017-07-19 DIAGNOSIS — G9341 Metabolic encephalopathy: Secondary | ICD-10-CM

## 2017-07-19 DIAGNOSIS — I2699 Other pulmonary embolism without acute cor pulmonale: Secondary | ICD-10-CM

## 2017-07-19 DIAGNOSIS — I469 Cardiac arrest, cause unspecified: Secondary | ICD-10-CM

## 2017-07-19 LAB — GLUCOSE, CAPILLARY
GLUCOSE-CAPILLARY: 175 mg/dL — AB (ref 65–99)
GLUCOSE-CAPILLARY: 149 mg/dL — AB (ref 65–99)
Glucose-Capillary: 183 mg/dL — ABNORMAL HIGH (ref 65–99)
Glucose-Capillary: 205 mg/dL — ABNORMAL HIGH (ref 65–99)
Glucose-Capillary: 126 mg/dL — ABNORMAL HIGH (ref 65–99)

## 2017-07-19 LAB — CBC WITH DIFFERENTIAL/PLATELET
Basophils Absolute: 0 10*3/uL (ref 0.0–0.1)
Basophils Relative: 0 %
EOS PCT: 2 %
Eosinophils Absolute: 0.4 10*3/uL (ref 0.0–0.7)
HEMATOCRIT: 23.6 % — AB (ref 39.0–52.0)
HEMOGLOBIN: 8.2 g/dL — AB (ref 13.0–17.0)
LYMPHS ABS: 1.5 10*3/uL (ref 0.7–4.0)
Lymphocytes Relative: 7 %
MCH: 26.6 pg (ref 26.0–34.0)
MCHC: 34.7 g/dL (ref 30.0–36.0)
MCV: 76.6 fL — ABNORMAL LOW (ref 78.0–100.0)
MONOS PCT: 2 %
Monocytes Absolute: 0.4 10*3/uL (ref 0.1–1.0)
Neutro Abs: 19.6 10*3/uL — ABNORMAL HIGH (ref 1.7–7.7)
Neutrophils Relative %: 89 %
Platelets: 140 10*3/uL — ABNORMAL LOW (ref 150–400)
RBC: 3.08 MIL/uL — AB (ref 4.22–5.81)
RDW: 16.1 % — ABNORMAL HIGH (ref 11.5–15.5)
WBC: 21.9 10*3/uL — AB (ref 4.0–10.5)

## 2017-07-19 LAB — RENAL FUNCTION PANEL
ALBUMIN: 1.6 g/dL — AB (ref 3.5–5.0)
ANION GAP: 34 — AB (ref 5–15)
Albumin: 1.6 g/dL — ABNORMAL LOW (ref 3.5–5.0)
Anion gap: 32 — ABNORMAL HIGH (ref 5–15)
BUN: 82 mg/dL — ABNORMAL HIGH (ref 6–20)
BUN: 92 mg/dL — ABNORMAL HIGH (ref 6–20)
CALCIUM: 8.5 mg/dL — AB (ref 8.9–10.3)
CO2: 25 mmol/L (ref 22–32)
CO2: 25 mmol/L (ref 22–32)
CREATININE: 7.74 mg/dL — AB (ref 0.61–1.24)
Calcium: 8.1 mg/dL — ABNORMAL LOW (ref 8.9–10.3)
Chloride: 84 mmol/L — ABNORMAL LOW (ref 101–111)
Chloride: 83 mmol/L — ABNORMAL LOW (ref 101–111)
Creatinine, Ser: 8.34 mg/dL — ABNORMAL HIGH (ref 0.61–1.24)
GFR calc non Af Amer: 6 mL/min — ABNORMAL LOW (ref >60)
GFR calc non Af Amer: 6 mL/min — ABNORMAL LOW (ref >60)
GFR, EST AFRICAN AMERICAN: 7 mL/min — AB (ref >60)
GFR, EST AFRICAN AMERICAN: 7 mL/min — AB (ref >60)
GLUCOSE: 216 mg/dL — AB (ref 65–99)
Glucose, Bld: 162 mg/dL — ABNORMAL HIGH (ref 65–99)
Phosphorus: 11.5 mg/dL — ABNORMAL HIGH (ref 2.5–4.6)
Phosphorus: 12.8 mg/dL — ABNORMAL HIGH (ref 2.5–4.6)
Potassium: 3.4 mmol/L — ABNORMAL LOW (ref 3.5–5.1)
Potassium: 3.7 mmol/L (ref 3.5–5.1)
SODIUM: 142 mmol/L (ref 135–145)
Sodium: 141 mmol/L (ref 135–145)

## 2017-07-19 LAB — MAGNESIUM: Magnesium: 2.3 mg/dL (ref 1.7–2.4)

## 2017-07-19 LAB — HEPARIN LEVEL (UNFRACTIONATED)
Heparin Unfractionated: 0.22 IU/mL — ABNORMAL LOW (ref 0.30–0.70)
Heparin Unfractionated: 0.28 IU/mL — ABNORMAL LOW (ref 0.30–0.70)

## 2017-07-19 NOTE — Progress Notes (Signed)
ANTICOAGULATION CONSULT NOTE  Pharmacy Consult for Heparin Indication: pulmonary embolus  Allergies  Allergen Reactions  . Penicillins Nausea And Vomiting and Rash    Has patient had a PCN reaction causing immediate rash, facial/tongue/throat swelling, SOB or lightheadedness with hypotension: No Has patient had a PCN reaction causing severe rash involving mucus membranes or skin necrosis: No Has patient had a PCN reaction that required hospitalization: No Has patient had a PCN reaction occurring within the last 10 years: No If all of the above answers are "NO", then may proceed with Cephalosporin use.     Patient Measurements: Height: 5\' 7"  (170.2 cm) Weight: 185 lb 10 oz (84.2 kg) IBW/kg (Calculated) : 66.1 Heparin Dosing Weight: 82.2 kg  Vital Signs: Temp: 98.2 F (36.8 C) (06/22 1155) Temp Source: Oral (06/22 1155) BP: 133/57 (06/22 1500) Pulse Rate: 90 (06/22 1500)  Labs: Recent Labs    07/18/17 0345 07/18/17 0922 07/18/17 1354 07/18/17 1529 07/18/17 1730 07/19/17 0512 07/19/17 0513 07/19/17 0514 07/19/17 1531  HGB 8.2* 8.1*  --   --   --   --  8.2*  --   --   HCT 24.3* 23.8*  --   --   --   --  23.6*  --   --   PLT 147*  146* 144*  --   --   --   --  140*  --   --   APTT 65* 77* 91* 88* 82*  --   --   --   --   LABPROT 25.5*  24.9* 25.9*  --   --   --   --   --   --   --   INR 2.34  2.28 2.40  --   --   --   --   --   --   --   HEPARINUNFRC  --   --   --   --   --  0.22*  --   --  0.28*  CREATININE 5.92*  --   --  6.69*  --   --   --  7.74*  --     Estimated Creatinine Clearance: 8.9 mL/min (A) (by C-G formula based on SCr of 7.74 mg/dL (H)).  Assessment: 72 year old male with PE s/p tPA. Pharmacy now consulted to dose heparin. Heparin level is slightly subtherapeutic at 0.28 despite a rate increase this morning. Heparin was off for about an hour and a half this afternoon for a line placement. Heparin was re-started at 13:25. CBC this AM was stable and no  signs of bleeding have been noted.    Goal of Therapy:  Heparin level 0.3 to 0.5 units/ml for 24 hours then increase to 0.3 to 0.7 units/ml 6/22 at 8 PM Monitor platelets by anticoagulation protocol: Yes   Plan:  Continue heparin at 1300 units/hr.  Level likely low due to off time this afternoon F/U heparin level with AM labs  Monitor for signs/symptoms of bleeding   Sharin MonsEmily Sinclair, PharmD, BCPS PGY2 Infectious Diseases Pharmacy Resident Phone: [phone] 07/19/2017 4:15 PM

## 2017-07-19 NOTE — Progress Notes (Signed)
ANTICOAGULATION CONSULT NOTE  Pharmacy Consult for Heparin Indication: pulmonary embolus  Allergies  Allergen Reactions  . Penicillins Nausea And Vomiting and Rash    Has patient had a PCN reaction causing immediate rash, facial/tongue/throat swelling, SOB or lightheadedness with hypotension: No Has patient had a PCN reaction causing severe rash involving mucus membranes or skin necrosis: No Has patient had a PCN reaction that required hospitalization: No Has patient had a PCN reaction occurring within the last 10 years: No If all of the above answers are "NO", then may proceed with Cephalosporin use.     Patient Measurements: Height: 5\' 7"  (170.2 cm) Weight: 185 lb 10 oz (84.2 kg) IBW/kg (Calculated) : 66.1 Heparin Dosing Weight: 82.2 kg  Vital Signs: Temp: 100.8 F (38.2 C) (06/22 0337) Temp Source: Oral (06/22 0337) BP: 122/54 (06/22 0000) Pulse Rate: 94 (06/22 0000)  Labs: Recent Labs    07/17/17 1948  07/18/17 0345 07/18/17 0922 07/18/17 1354 07/18/17 1529 07/18/17 1730 07/19/17 0512 07/19/17 0513  HGB 8.0*  --  8.2* 8.1*  --   --   --   --  8.2*  HCT 25.8*  --  24.3* 23.8*  --   --   --   --  23.6*  PLT 156  --  147*  146* 144*  --   --   --   --  140*  APTT  --    < > 65* 77* 91* 88* 82*  --   --   LABPROT  --   --  25.5*  24.9* 25.9*  --   --   --   --   --   INR  --   --  2.34  2.28 2.40  --   --   --   --   --   HEPARINUNFRC  --   --   --   --   --   --   --  0.22*  --   CREATININE 5.35*  --  5.92*  --   --  6.69*  --   --   --    < > = values in this interval not displayed.    Estimated Creatinine Clearance: 10.3 mL/min (A) (by C-G formula based on SCr of 6.69 mg/dL (H)).  Assessment: 72 year old male with PE s/p tPA for heparin  Goal of Therapy:  Heparin level 0.3 to 0.5 units/ml for 24 hours then increase to 0.3 to 0.7 units/ml.  Monitor platelets by anticoagulation protocol: Yes   Plan:  Increase Heparin  1300 units/hr Check heparin level  in 8 hours.   Geannie RisenGreg Vennie Waymire, PharmD, BCPS

## 2017-07-19 NOTE — Progress Notes (Signed)
PULMONARY / CRITICAL CARE MEDICINE   Name: [name] Dudley MRN: [id number] DOB: September 17, 1945    ADMISSION DATE:  06/29/2017 CONSULTATION DATE:  07/08/2017  REFERRING MD:  Meridee Score, MD  CHIEF COMPLAINT:  Unresponsive, found down  HISTORY OF PRESENT ILLNESS:   72 y/o male with ESRD on HD presented on 6/15 after a wellness check found him down.  The initial presentation was concerning for a possible narcotic overdose.  He was treated for pneumonia and required vasopressors.  His mental status didn't improve so an MRI brain was ordered which showed nothing acute.  While receiving hemodialysis on 6/20 he had a cardiac arrest due to a PE.    SUBJECTIVE:   Remains on pressor support, b/p labile  Unresponsive , not following commands    VITAL SIGNS: BP 124/63 (BP Location: Right Arm)   Pulse 95   Temp 98.6 F (37 C) (Oral)   Resp 19   Ht 5\' 7"  (1.702 m)   Wt 185 lb 10 oz (84.2 kg)   SpO2 100%   BMI 29.07 kg/m   HEMODYNAMICS:    VENTILATOR SETTINGS: Vent Mode: PRVC FiO2 (%):  [60 %-90 %] 60 % Set Rate:  [18 bmp] 18 bmp Vt Set:  [530 mL-620 mL] 530 mL PEEP:  [5 cmH20] 5 cmH20 Plateau Pressure:  [19 cmH20-30 cmH20] 19 cmH20  INTAKE / OUTPUT: I/O last 3 completed shifts: In: 6392.7 [I.V.:5396.4; NG/GT:720; IV Piggyback:276.3] Out: 4950 [Emesis/NG output:600; Other:950; Stool:3400]  PHYSICAL EXAMINATION:  General: Chronically ill-appearing male on vent HENT: NCAT, ET tube PULM: Coarse breath sounds on vent  CV: Irregular no mrg GI: Bowel sounds positive, soft nontender  MSK: Intact Neuro: Withdrawal to painful stimuli , unresponsive , no sedation    LABS:  BMET Recent Labs  Lab 07/18/17 0345 07/18/17 1529 07/19/17 0514  NA 139 141 141  K 3.6 3.9 3.4*  CL 93* 88* 84*  CO2 15* 19* 25  BUN 54* 68* 82*  CREATININE 5.92* 6.69* 7.74*  GLUCOSE 186* 242* 216*    Electrolytes Recent Labs  Lab 07/17/17 1948 07/18/17 0345 07/18/17 1529 07/19/17 0513  07/19/17 0514  CALCIUM 10.3 8.4* 8.3*  --  8.1*  MG 3.0* 2.4  --  2.3  --   PHOS 13.4* 8.8* 11.2*  --  11.5*    CBC Recent Labs  Lab 07/18/17 0345 07/18/17 0922 07/19/17 0513  WBC 26.3* 23.1* 21.9*  HGB 8.2* 8.1* 8.2*  HCT 24.3* 23.8* 23.6*  PLT 147*  146* 144* 140*    Coag's Recent Labs  Lab 06/28/2017 1726  07/18/17 0345 07/18/17 0922 07/18/17 1354 07/18/17 1529 07/18/17 1730  APTT  --    < > 65* 77* 91* 88* 82*  INR 1.28  --  2.34  2.28 2.40  --   --   --    < > = values in this interval not displayed.    Sepsis Markers Recent Labs  Lab 07/14/17 0040 07/14/17 0452 07/14/17 0628 07/16/17 1232 07/17/17 0913 07/18/17 0345  LATICACIDVEN 5.4* 3.9* 2.1*  --   --   --   PROCALCITON  --   --  89.97 71.75 58.46 51.54    ABG Recent Labs  Lab 07/17/17 1930 07/17/17 2337 07/18/17 2343  PHART 7.045* 7.385 7.516*  PCO2ART 44.0 28.8* 31.6*  PO2ART 122* 185.0* 99.0    Liver Enzymes Recent Labs  Lab 07/25/2017 1726  07/18/17 0345 07/18/17 1529 07/19/17 0514  AST 45*  --   --   --   --  ALT 15*  --   --   --   --   ALKPHOS 85  --   --   --   --   BILITOT 0.7  --   --   --   --   ALBUMIN 2.7*   < > 1.8* 1.7* 1.6*   < > = values in this interval not displayed.    Cardiac Enzymes Recent Labs  Lab 07/13/17 1522  TROPONINI 0.30*    Glucose Recent Labs  Lab 07/18/17 1117 07/18/17 1511 07/18/17 1938 07/18/17 2340 07/19/17 0337 07/19/17 0754  GLUCAP 199* 219* 168* 172* 183* 205*    Imaging No results found.   STUDIES:  6/15  CTH >> small vessel ischemic disease, no acute abnormality  6/16  EEG >> abnormal due to the presence of moderate diffuse background slowing with triphasic waves. This is non-specific in etiology and can be seen with hypoxic/ischemic injury, toxic/metabolic encephalopathies, neurodegenerative disorders, or medication effect.  6/19 MRI brain>>> 1. No acute intracranial abnormality. 2. Small remote ventral right pontine  infarct. 3. Mild chronic small vessel ischemic disease. 6/20 CT angiogram>  Left lower lobe pulmonary embolism, cavitary lesion left lower lobe, possible infarct, atelectasis RLL  CULTURES: BCx2 6/15 >>NEG  Tracheal aspirate 6/15 >>abundant GNC, mod GPC>>> H Flu, Beta Lactamase Negative  ANTIBIOTICS: Vancomycin 6/15 >>6/18 Zosyn 6/16 >> 6/18 Ceftriaxone 6/18>>>  SIGNIFICANT EVENTS: 6/15  Admit with AMS, found down at home, ? narc contribution, intubated in ER  LINES/TUBES: ETT 6/15 >> R chest perm cath (pta)>>>  DISCUSSION: 72 year old male history of end-stage renal disease on hemodialysis with acute respiratory failure, AMS, aspiration PNA.   He has persistent encephalopathy which has been poorly understood.  On 6/20 he had a cardiac arrest due to a PE.     ASSESSMENT / PLAN:  PULMONARY A: Acute respiratory failure  PNA - H Flu  Pulmonary embolism s/p TPA 6/21  P:   Full mechanical vent support VAP prevention Daily WUA/SBT Hep IV  CARDIOVASCULAR A:  Irregular rhtyhm - ? type II mobitz. Suspect r/t metabolic derangements.  Improving.  H/o HTN -  Echo EF 55%,  Cardiogenic shock from massive pulmonary embolism; suspect embolism was from the perm cath  P:  Tele Wean levophed and vasopressin as able    RENAL A:   ESRD with uremia  Hyperkalemia-resolved  Relatively new fistula P:   Monitor BMET and UOP Replace electrolytes as needed Restart HD when able, renal follows , will need CRRT if restarted  D/c; HCO3 drip   GASTROINTESTINAL A:   NPO  P:   Continue tube feeding Continue medical stress ulcer prophylaxis  HEMATOLOGIC A:   Acute pulmonary embolism s/p TPA 6/21  P: Continue on Hep IV   INFECTIOUS A:   Aspiration pneumonia  P:   Remains on ceftriaxone, plan 8 days  ENDOCRINE A:   Diabetes mellitus   P: CBG q4, SSI  NEUROLOGIC A:   Acute Metabolic Encephalopathy - unclear etiology -- suspected uremia/hepatic encephalopathy but no  improvement with HD, lactulose. MRI neg.  Mental status not significantly improved 6/19 despite CRRT, HD, improved ammonia  MRI neg acute 6/19 P:   RASS goal 0 Remains off sedation> 72 hours May need neuro input continue lactulose Resume HD when able  FAMILY  - Updates: Updated family at length at bedside (son, daughter) on 6/21. I explained that his prognosis was poor prior to his cardiac arrest due to his multiple comorbid illnesses and persistent  encephalopathy.  Now post cardiac arrest his prognosis is worse.  However he is not brain dead and at this point I don't believe has an incurable condition.  We will treat the massive PE with TPA and then will resume care plan outlined earlier in the week and plan neuro consult if no improvement.  - Inter-disciplinary family meet or Palliative Care meeting due by Day 7  Ashlyn Cabler NP-C  [facility]  [phone]   07/19/2017

## 2017-07-19 NOTE — Progress Notes (Addendum)
[facility] ROUNDING NOTE   Subjective:   72 year old male history of end-stage renal disease ( started Nov 2018) on hemodialysis via right PermCath, insulin-dependent diabetes mellitus who was found down on a wellness check. Police found him unresponsive with vomitus throughout his home and when EMS arrived he was noted to have pinpoint pupils.  There is also report of an empty pill bottle that actually belonged to his friend who normally gives him rides to dialysis, they stated that the name started with hydro-and they believed it to be a narcotic. In the emergency room the patient was found to be hyperkalemic and ws started on CRRT   The permcath has had poor function during CRRT and clotted multiple times 6/17 despite use of TPA and heparin in the circuit   It was decided to transition the patient to intermittent hemodialysis particularly as the patient is now hemodynamically stable and is no longer requiring pressors. EEG  findings indicates diffuse cerebral dysfunction that is non-specific in etiology and can be seen with hypoxic/ischemic injury, toxic/metabolic encephalopathies, neurodegenerative disorders, or medication effect.  Remains intubated. Attempts are being made to keep friends and family informed. Decisions about goals of care would be appropriate at this stage- it looks like we are heading to palliative care   Mental condition does not allow for intubation. Pupils are sluggish   During dialysis on 6/20 he developed an acute pulmonary embolism, PEA arrest and required CPR for 12 minutes. He had VFib and required dc cardioversion   He received TPA and is now on vasopressin, levophed and dopamine as pressor requirements    He continues on pressors although has had a decrease in requirement  A family meeting occurred 6/21 and at this time no decision has been made to withdraw care. He is not stable for intermittent dialysis and will restart CRRT     Discussed with Dr  L[place] Bells  The patient continues to display clinical signs and hypotension and shock  We will not initiate CRRT at this point Family have agreed to DNR status       Objective:  Vital signs in last 24 hours:  Temp:  [98.2 F (36.8 C)-100.8 F (38.2 C)] 98.6 F (37 C) (06/22 0751) Pulse Rate:  [79-101] 87 (06/22 1000) Resp:  [18-37] 21 (06/22 1000) BP: (87-152)/(40-65) 125/62 (06/22 1000) SpO2:  [84 %-100 %] 100 % (06/22 1000) Arterial Line BP: (85-200)/(31-50) 123/38 (06/22 1000) FiO2 (%):  [50 %-90 %] 50 % (06/22 0808) Weight:  [185 lb 10 oz (84.2 kg)] 185 lb 10 oz (84.2 kg) (06/22 0500)  Weight change: 3 lb 4.9 oz (1.5 kg) Filed Weights   07/17/17 1430 07/18/17 0400 07/19/17 0500  Weight: 182 lb 5.1 oz (82.7 kg) 181 lb 3.5 oz (82.2 kg) 185 lb 10 oz (84.2 kg)    Intake/Output: I/O last 3 completed shifts: In: 6392.7 [I.V.:5396.4; NG/GT:720; IV Piggyback:276.3] Out: 4950 [Emesis/NG output:600; Other:950; NWGNF:6213]   Intake/Output this shift:  Total I/O In: 544.7 [I.V.:304.7; NG/GT:240] Out: 0   Unresponsive to voice  Withdraws to pain and has sluggish pupils  ETT  CVS- RRR   RS- CTA mechanical breath sounds on vent  ABD- BS present soft non-distended EXT- no edema LUE thrill  AVF    Basic Metabolic Panel: Recent Labs  Lab 07/16/17 1610 07/17/17 0913 07/17/17 1948 07/18/17 0345 07/18/17 1529 07/19/17 0513 07/19/17 0514  NA 138 140 142 139 141  --  141  K 3.9 3.5 4.3 3.6 3.9  --  3.4*  CL 97* 100* 98* 93* 88*  --  84*  CO2 22 22 13* 15* 19*  --  25  GLUCOSE 195* 333* 65 186* 242*  --  216*  BUN 57* 80* 42* 54* 68*  --  82*  CREATININE 6.63* 7.94* 5.35* 5.92* 6.69*  --  7.74*  CALCIUM 9.2 9.2 10.3 8.4* 8.3*  --  8.1*  MG 2.3 2.6* 3.0* 2.4  --  2.3  --   PHOS 7.4*  7.4* 7.2* 13.4* 8.8* 11.2*  --  11.5*    Liver Function Tests: Recent Labs  Lab 07/08/2017 1726  07/17/17 0913 07/17/17 1948 07/18/17 0345 07/18/17 1529 07/19/17 0514  AST 45*  --   --    --   --   --   --   ALT 15*  --   --   --   --   --   --   ALKPHOS 85  --   --   --   --   --   --   BILITOT 0.7  --   --   --   --   --   --   PROT 8.8*  --   --   --   --   --   --   ALBUMIN 2.7*   < > 1.7* 2.1* 1.8* 1.7* 1.6*   < > = values in this interval not displayed.   Recent Labs  Lab 06/29/2017 2035  LIPASE 26   Recent Labs  Lab 07/15/17 1952  AMMONIA 23    CBC: Recent Labs  Lab 07/16/2017 1726  07/17/17 0913 07/17/17 1948 07/18/17 0345 07/18/17 0922 07/19/17 0513  WBC 3.7*   < > 15.9* 41.4* 26.3* 23.1* 21.9*  NEUTROABS 2.1  --   --   --   --   --  19.6*  HGB 11.8*   < > 8.6* 8.0* 8.2* 8.1* 8.2*  HCT 36.7*   < > 25.5* 25.8* 24.3* 23.8* 23.6*  MCV 82.1   < > 78.2 84.6 78.1 78.3 76.6*  PLT 267   < > 168 156 147*  146* 144* 140*   < > = values in this interval not displayed.    Cardiac Enzymes: Recent Labs  Lab 07/16/2017 2150 07/13/17 1522  CKTOTAL 3,519*  --   TROPONINI  --  0.30*    BNP: Invalid input(s): POCBNP  CBG: Recent Labs  Lab 07/18/17 1511 07/18/17 1938 07/18/17 2340 07/19/17 0337 07/19/17 0754  GLUCAP 219* 168* 172* 183* 205*    Microbiology: Results for orders placed or performed during the hospital encounter of 07/18/2017  Culture, respiratory (tracheal aspirate)     Status: None   Collection Time: 06/28/2017  7:50 PM  Result Value Ref Range Status   Specimen Description TRACHEAL ASPIRATE  Final   Special Requests NONE  Final   Gram Stain   Final    ABUNDANT WBC PRESENT, PREDOMINANTLY PMN ABUNDANT GRAM NEGATIVE COCCOBACILLI MODERATE GRAM POSITIVE COCCI    Culture   Final    ABUNDANT HAEMOPHILUS INFLUENZAE BETA LACTAMASE NEGATIVE Performed at [facility], [place] [address], [place], [place] [address]    Report Status 07/15/2017 FINAL  Final  Culture, blood (routine x 2)     Status: None   Collection Time: 07/06/2017  8:20 PM  Result Value Ref Range Status   Specimen Description BLOOD PORTA CATH  Final   Special Requests    Final    BOTTLES DRAWN  AEROBIC AND ANAEROBIC Blood Culture adequate volume   Culture   Final    NO GROWTH 5 DAYS Performed at [facility], [place] [address], [place], [place] [address]    Report Status 07/17/2017 FINAL  Final  Culture, blood (routine x 2)     Status: None   Collection Time: 07/13/2017  8:35 PM  Result Value Ref Range Status   Specimen Description BLOOD RIGHT ANTECUBITAL  Final   Special Requests   Final    BOTTLES DRAWN AEROBIC AND ANAEROBIC Blood Culture adequate volume   Culture   Final    NO GROWTH 5 DAYS Performed at [facility], [place] [address] [place], [place] [address]    Report Status 07/17/2017 FINAL  Final  MRSA PCR Screening     Status: None   Collection Time: 07/19/2017  9:14 PM  Result Value Ref Range Status   MRSA by PCR NEGATIVE NEGATIVE Final    Comment:        The GeneXpert MRSA Assay (FDA approved for NASAL specimens only), is one component of a comprehensive MRSA colonization surveillance program. It is not intended to diagnose MRSA infection nor to guide or monitor treatment for MRSA infections. Performed at [facility], [place] [address], [place], [place] [address]     Coagulation Studies: Recent Labs    07/18/17 0345 07/18/17 0922  LABPROT 25.5*  24.9* 25.9*  INR 2.34  2.28 2.40    Urinalysis: No results for input(s): COLORURINE, LABSPEC, PHURINE, GLUCOSEU, HGBUR, BILIRUBINUR, KETONESUR, PROTEINUR, UROBILINOGEN, NITRITE, LEUKOCYTESUR in the last 72 hours.  Invalid input(s): APPERANCEUR    Imaging: Ct Angio Chest Pe W Or Wo Contrast  Result Date: 07/18/2017 CLINICAL DATA:  Pulmonary embolus suspected. High pretest probability. History of diabetes. EXAM: CT ANGIOGRAPHY CHEST WITH CONTRAST TECHNIQUE: Multidetector CT imaging of the chest was performed using the standard protocol during bolus administration of intravenous contrast. Multiplanar CT image reconstructions and MIPs were obtained to evaluate the vascular  anatomy. CONTRAST:  166m ISOVUE-370 IOPAMIDOL (ISOVUE-370) INJECTION 76% COMPARISON:  None. FINDINGS: Cardiovascular: Good opacification of the central and segmental pulmonary arteries. Filling defects in the left lower lobe pulmonary arteries consistent with acute pulmonary embolus. Normal caliber thoracic aorta without evidence of dissection. Great vessel origins are patent. Aortic and coronary artery calcifications. Normal heart size. No pericardial effusion. Mediastinum/Nodes: Endotracheal and enteric tubes are present. Endotracheal tube tip is above the carina. Enteric tube tip is in the stomach. Esophagus is decompressed. No significant lymphadenopathy. Thyroid gland is unremarkable. Lungs/Pleura: Consolidation or atelectasis in both lung bases. Decreased enhancement of the consolidated area in the left lung base with some cystic change likely indicates pulmonary infarct. No pleural effusions. No pneumothorax. Emphysematous changes in the upper lungs. Patchy areas of airspace disease throughout the lungs with bronchial opacification. This could be due to bronchopneumonia or aspiration. Upper Abdomen: No acute abnormality. Musculoskeletal: Degenerative changes in the spine. Diffuse bone sclerosis could indicate metabolic bone disease such as renal osteodystrophy or may be related to sickle cell disease. Multiple Schmorl's nodes are present. Lucent mildly expansile lesion in the spinous process of T1 possibly representing bone cyst. Nonaggressive appearance is suggested. Review of the MIP images confirms the above findings. IMPRESSION: 1. Positive examination for pulmonary embolus with filling defects in the left lower lobe pulmonary arteries. 2. Consolidation or atelectasis in both lung bases. Probable left lower lobe pulmonary infarct. 3. Patchy airspace disease in both lungs with bronchial opacification. This could  be due to bronchopneumonia or aspiration. 4. Diffuse bone sclerosis may indicate metabolic  bone disease, renal osteodystrophy, or sickle cell disease. Diffuse metastasis could also have this appearance if the patient has a history of primary neoplasm. Aortic Atherosclerosis (ICD10-I70.0) and Emphysema (ICD10-J43.9). These results were called by telephone at the time of interpretation on 07/18/2017 at 2:51 am to Dr. Brand Males , who verbally acknowledged these results. Electronically Signed   By: Lucienne Capers M.D.   On: 07/18/2017 03:14   Dg Chest Port 1 View  Result Date: 07/19/2017 CLINICAL DATA:  Acute respiratory failure and hypoxemia. EXAM: PORTABLE CHEST 1 VIEW COMPARISON:  Chest x-ray from yesterday. FINDINGS: Unchanged endotracheal tube, enteric tube, and tunneled right internal jugular dialysis catheter. Stable cardiomediastinal silhouette. Unchanged small left pleural effusion and left greater than right basilar atelectasis. No pneumothorax. No acute osseous abnormality. IMPRESSION: 1. Unchanged small left pleural effusion and bibasilar atelectasis. Electronically Signed   By: Titus Dubin M.D.   On: 07/19/2017 09:01   Dg Chest Portable 1 View  Result Date: 07/18/2017 CLINICAL DATA:  Respiratory failure. EXAM: PORTABLE CHEST 1 VIEW COMPARISON:  CT 07/18/2017.  Chest x-ray 07/17/2017. FINDINGS: Endotracheal tube, NG tube, dual-lumen right IJ catheter in stable position. Heart size stable. Persistent bibasilar and left mid lung field atelectatic changes. Tiny left pleural effusion. No pneumothorax. Sclerotic bony changes best identified by prior CT. IMPRESSION: 1.  Lines and tubes in stable position.  No pneumothorax. 2. Persistent bibasilar and left mid lung field atelectatic changes. Tiny left pleural effusion. Heart size normal. Similar findings on prior exams. 3.  Sclerotic bony changes best identified by prior CT. Electronically Signed   By: Marcello Moores  Register   On: 07/18/2017 05:53   Dg Chest Port 1 View  Result Date: 07/17/2017 CLINICAL DATA:  Cardiac arrest. EXAM:  PORTABLE CHEST 1 VIEW COMPARISON:  Chest radiograph July 16, 2017 at 0459 hours FINDINGS: Cardiomediastinal silhouette is normal, patient rotated to the RIGHT. Calcified aortic arch. Improved aeration LEFT lung base, bibasilar strandy densities. No pleural effusion. No pneumothorax. Endotracheal tube tip projects 2.5 cm above the carina. Tunneled dialysis catheter via RIGHT internal jugular venous approach distal tip projects in proximal RIGHT atrium. Nasogastric tube past GE junction, distal tip out of field of view. Soft tissue planes and included osseous structures are unchanged. IMPRESSION: Re-expanded LEFT lung base with bibasilar atelectasis. No apparent change in life support lines. Aortic Atherosclerosis (ICD10-I70.0). Electronically Signed   By: Elon Alas M.D.   On: 07/17/2017 19:53     Medications:   . sodium chloride 10 mL/hr at 07/17/17 2000  . sodium chloride    . sodium chloride    . sodium chloride    . sodium chloride    . sodium chloride    . albumin human 25 g (07/17/17 1737)  . cefTRIAXone (ROCEPHIN)  IV 1 g (07/19/17 0000)  . famotidine (PEPCID) IV 20 mg (07/18/17 2309)  . feeding supplement (VITAL AF 1.2 CAL) 1,000 mL (07/18/17 1730)  . heparin 1,300 Units/hr (07/19/17 1000)  . norepinephrine (LEVOPHED) Adult infusion 12 mcg/min (07/19/17 1000)  . vasopressin (PITRESSIN) infusion - *FOR SHOCK* 0.03 Units/min (07/18/17 1841)   . chlorhexidine gluconate (MEDLINE KIT)  15 mL Mouth Rinse BID  . Chlorhexidine Gluconate Cloth  6 each Topical Q0600  . Chlorhexidine Gluconate Cloth  6 each Topical Q0600  . darbepoetin (ARANESP) injection - DIALYSIS  100 mcg Intravenous Q Thu-HD  . feeding supplement (PRO-STAT SUGAR FREE 64)  30 mL Per Tube Daily  . insulin aspart  0-20 Units Subcutaneous Q4H  . insulin glargine  15 Units Subcutaneous Daily  . lactulose  30 g Oral TID  . mouth rinse  15 mL Mouth Rinse 10 times per day   sodium chloride, sodium chloride, sodium  chloride, sodium chloride, sodium chloride, Place/Maintain arterial line **AND** sodium chloride, acetaminophen (TYLENOL) oral liquid 160 mg/5 mL, albumin human, alteplase, docusate, fentaNYL (SUBLIMAZE) injection, heparin, lidocaine (PF), lidocaine-prilocaine, pentafluoroprop-tetrafluoroeth  Assessment/ Plan:   1. End stage renal disease    Did not complete dialysis yesterday due to cardiac arrest and massive pulmonary embolus 6/20 now on pressor medications and doubt that he could tolerate intermittent hemodialysis  Will start CRRT 6/22   Will need temporary catheter 2. Acute respiratory Failure   Treated with vanc and zosyn  Now stopped  White count continues to increase   Mental status making weaning off ventilator difficult 3. Catheter malfunction      he will need CRRT and placement of a dialysis catheter   Goals of care are full support at this time   4. Diabetes stable  5. Acute encephalopathy  EEG as above and moving towards establishing goals of care  6. Anemia  Decreased hemoglobin will start darbepoietin   Iron stores Ts[place] 61 %  ( 6/20) 7. Pulmonary Embolus and cardiac arrest  tpa administered   8. Hypotension    IV pressors   Dopamine  Vasopressin and Norepinephrine   The dose of vasopressin has been tapered and stopped     LOS: 7 Kortni Hasten W '@TODAY''@10'$ :55 AM

## 2017-07-19 NOTE — Progress Notes (Signed)
BRIEF NEUROLOGY NOTE:  Neurology was initially asked to consult for HIE. Chart, imaging and labs all reviewed. Pt was exam was done and found to be unresponsive in coma, GCS 3T. Upon d/w family they tell me they have now decided to transition to comfort care and would not like any further neurologic exams. He is DNR/DNI at this time primary team is preparing to terminally extubate. No charge.

## 2017-07-20 ENCOUNTER — Inpatient Hospital Stay (HOSPITAL_COMMUNITY): Payer: Medicare Other

## 2017-07-20 DIAGNOSIS — Z7189 Other specified counseling: Secondary | ICD-10-CM

## 2017-07-20 DIAGNOSIS — Z515 Encounter for palliative care: Secondary | ICD-10-CM

## 2017-07-20 DIAGNOSIS — G9340 Encephalopathy, unspecified: Secondary | ICD-10-CM

## 2017-07-20 LAB — BASIC METABOLIC PANEL
ANION GAP: 36 — AB (ref 5–15)
BUN: 105 mg/dL — ABNORMAL HIGH (ref 6–20)
CALCIUM: 8.6 mg/dL — AB (ref 8.9–10.3)
CO2: 22 mmol/L (ref 22–32)
Chloride: 87 mmol/L — ABNORMAL LOW (ref 101–111)
Creatinine, Ser: 9.83 mg/dL — ABNORMAL HIGH (ref 0.61–1.24)
GFR, EST AFRICAN AMERICAN: 5 mL/min — AB (ref >60)
GFR, EST NON AFRICAN AMERICAN: 5 mL/min — AB (ref >60)
Glucose, Bld: 134 mg/dL — ABNORMAL HIGH (ref 65–99)
POTASSIUM: 3.8 mmol/L (ref 3.5–5.1)
Sodium: 145 mmol/L (ref 135–145)

## 2017-07-20 LAB — RENAL FUNCTION PANEL
ALBUMIN: 1.6 g/dL — AB (ref 3.5–5.0)
Anion gap: 33 — ABNORMAL HIGH (ref 5–15)
BUN: 119 mg/dL — AB (ref 6–20)
CO2: 24 mmol/L (ref 22–32)
CREATININE: 10.45 mg/dL — AB (ref 0.61–1.24)
Calcium: 8.6 mg/dL — ABNORMAL LOW (ref 8.9–10.3)
Chloride: 86 mmol/L — ABNORMAL LOW (ref 101–111)
GFR, EST AFRICAN AMERICAN: 5 mL/min — AB (ref >60)
GFR, EST NON AFRICAN AMERICAN: 4 mL/min — AB (ref >60)
Glucose, Bld: 181 mg/dL — ABNORMAL HIGH (ref 65–99)
PHOSPHORUS: 15.1 mg/dL — AB (ref 2.5–4.6)
POTASSIUM: 3.7 mmol/L (ref 3.5–5.1)
Sodium: 143 mmol/L (ref 135–145)

## 2017-07-20 LAB — GLUCOSE, CAPILLARY
GLUCOSE-CAPILLARY: 121 mg/dL — AB (ref 65–99)
GLUCOSE-CAPILLARY: 138 mg/dL — AB (ref 65–99)
GLUCOSE-CAPILLARY: 139 mg/dL — AB (ref 65–99)
GLUCOSE-CAPILLARY: 141 mg/dL — AB (ref 65–99)
Glucose-Capillary: 134 mg/dL — ABNORMAL HIGH (ref 65–99)
Glucose-Capillary: 143 mg/dL — ABNORMAL HIGH (ref 65–99)
Glucose-Capillary: 147 mg/dL — ABNORMAL HIGH (ref 65–99)

## 2017-07-20 LAB — CBC
HEMATOCRIT: 24.3 % — AB (ref 39.0–52.0)
Hemoglobin: 8 g/dL — ABNORMAL LOW (ref 13.0–17.0)
MCH: 25.9 pg — ABNORMAL LOW (ref 26.0–34.0)
MCHC: 32.9 g/dL (ref 30.0–36.0)
MCV: 78.6 fL (ref 78.0–100.0)
Platelets: 128 10*3/uL — ABNORMAL LOW (ref 150–400)
RBC: 3.09 MIL/uL — ABNORMAL LOW (ref 4.22–5.81)
RDW: 17.1 % — AB (ref 11.5–15.5)
WBC: 25.9 10*3/uL — ABNORMAL HIGH (ref 4.0–10.5)

## 2017-07-20 LAB — MAGNESIUM: MAGNESIUM: 2.7 mg/dL — AB (ref 1.7–2.4)

## 2017-07-20 LAB — PHOSPHORUS: Phosphorus: 3.4 mg/dL (ref 2.5–4.6)

## 2017-07-20 LAB — HEPARIN LEVEL (UNFRACTIONATED): HEPARIN UNFRACTIONATED: 0.37 [IU]/mL (ref 0.30–0.70)

## 2017-07-20 NOTE — Consult Note (Signed)
Consultation Note Date: 07/20/2017   Patient Name: [name] Dudley  DOB: 1945-06-19  MRN: [id number]  Age / Sex: 72 y.o., male  PCP: [facility] Referring Physician: Juanito Doom, MD  Reason for Consultation: Establishing goals of care  HPI/Patient Profile:   Unable to dialyze due to pressures. During dialysis on 6/20 he developed an acute pulmonary embolism, PEA arrest and required CPR for 12 minutes. He had VFib and required dc cardioversion   He received TPA and is now on vasopressin, levophed and dopamine as pressor requirements.  He remains intubated.    Clinical Assessment and Goals of Care: Met with daughter. She states she had a relationship years ago with her father, and just began another relationship with him in the last few months. He has 2 children, her and her brother. He lives alone.  She states she also has a younger brother she just found out she has recently. Her younger brother lives with her in [place]. She states her father has sisters, but neither are able to come to [facility].   She states she knew he had been using heroine and other drugs. She discusses herself and her personal history in great detail.  She states she understands the situation, and is waiting for her mother, his ex--wife to come tonight. She states when he heard her voice on the phone, a tear rolled down his cheek. She is praying for a miracle. We discussed his renal failure and inability to dialyze, as well as his ventilator dependence. She is aware we are at end of life and at a point of making decisions.   She states tomorrow she will have POA papers for herself.     SUMMARY OF RECOMMENDATIONS    Family meeting tomorrow at 9:30.   Code Status/Advance Care Planning:  DNR    Symptom Management:   Per primary team.   Palliative Prophylaxis:   Eye Care and Oral Care  Prognosis:   Very poor.    Discharge Planning: Anticipated Hospital Death      Primary Diagnoses: Present on Admission: . Metabolic encephalopathy   I have reviewed the medical record, interviewed the patient and family, and examined the patient. The following aspects are pertinent.  Past Medical History:  Diagnosis Date  . Diabetes mellitus without complication ([place])   . Hypertension   . Neuropathy   . Renal disorder    Social History   Socioeconomic History  . Marital status: Single    Spouse name: Not on file  . Number of children: Not on file  . Years of education: Not on file  . Highest education level: Not on file  Occupational History  . Not on file  Social Needs  . Financial resource strain: Not on file  . Food insecurity:    Worry: Not on file    Inability: Not on file  . Transportation needs:    Medical: Not on file    Non-medical: Not on file  Tobacco Use  . Smoking status: Former Smoker  Types: Cigarettes  . Smokeless tobacco: Never Used  . Tobacco comment: stopped smoking Mar 2019  Substance and Sexual Activity  . Alcohol use: No    Frequency: Never  . Drug use: No  . Sexual activity: Not on file  Lifestyle  . Physical activity:    Days per week: Not on file    Minutes per session: Not on file  . Stress: Not on file  Relationships  . Social connections:    Talks on phone: Not on file    Gets together: Not on file    Attends religious service: Not on file    Active member of club or organization: Not on file    Attends meetings of clubs or organizations: Not on file    Relationship status: Not on file  Other Topics Concern  . Not on file  Social History Narrative  . Not on file   Family History  Problem Relation Age of Onset  . Cancer Mother        Patient does not know which type of cancer  . Hypertension Father    Scheduled Meds: . chlorhexidine gluconate (MEDLINE KIT)  15 mL Mouth Rinse BID  . Chlorhexidine Gluconate Cloth  6 each Topical Q0600  .  Chlorhexidine Gluconate Cloth  6 each Topical Q0600  . darbepoetin (ARANESP) injection - DIALYSIS  100 mcg Intravenous Q Thu-HD  . feeding supplement (PRO-STAT SUGAR FREE 64)  30 mL Per Tube Daily  . insulin aspart  0-20 Units Subcutaneous Q4H  . insulin glargine  15 Units Subcutaneous Daily  . mouth rinse  15 mL Mouth Rinse 10 times per day   Continuous Infusions: . sodium chloride 10 mL/hr at 07/17/17 2000  . sodium chloride    . sodium chloride    . sodium chloride    . sodium chloride    . sodium chloride    . albumin human 25 g (07/17/17 1737)  . cefTRIAXone (ROCEPHIN)  IV 1 g (07/19/17 2202)  . famotidine (PEPCID) IV 20 mg (07/19/17 2321)  . feeding supplement (VITAL AF 1.2 CAL) 1,000 mL (07/19/17 1202)  . heparin 1,300 Units/hr (07/20/17 1600)  . norepinephrine (LEVOPHED) Adult infusion 6 mcg/min (07/20/17 1631)  . vasopressin (PITRESSIN) infusion - *FOR SHOCK* 0.03 Units/min (07/19/17 1927)   PRN Meds:.sodium chloride, sodium chloride, sodium chloride, sodium chloride, sodium chloride, Place/Maintain arterial line **AND** sodium chloride, acetaminophen (TYLENOL) oral liquid 160 mg/5 mL, albumin human, alteplase, docusate, fentaNYL (SUBLIMAZE) injection, heparin, lidocaine (PF), lidocaine-prilocaine, pentafluoroprop-tetrafluoroeth Medications Prior to Admission:  Prior to Admission medications   Medication Sig Start Date End Date Taking? Authorizing Provider  amLODipine (NORVASC) 10 MG tablet Take 10 mg daily by mouth.   Yes [provider]  baclofen (LIORESAL) 10 MG tablet Take 10 mg by mouth 3 (three) times daily.   Yes [provider]  buPROPion (WELLBUTRIN SR) 100 MG 12 hr tablet Take 100 mg by mouth every morning.   Yes [provider]  Cholecalciferol (VITAMIN D-3) 1000 units CAPS Take 1,000 Units daily by mouth.   Yes [provider]  cloNIDine (CATAPRES) 0.1 MG tablet Take 0.1 mg 2 (two) times daily by mouth.   Yes [provider]  ferric citrate (AURYXIA) 1 GM 210 MG(Fe) tablet Take 210-420 mg by mouth See admin instructions. 437m three times daily with meals and 2147monce daily with a snack   Yes [provider]  ferrous sulfate 325 (65 FE) MG tablet Take 325 mg  3 (three) times daily with meals by mouth.   Yes [provider]  folic acid (FOLVITE) 1 MG tablet Take 1 mg by mouth daily.   Yes [provider]  gabapentin (NEURONTIN) 400 MG capsule Take 400 mg at bedtime by mouth.   Yes [provider]  guaiFENesin-codeine (ROBITUSSIN AC) 100-10 MG/5ML syrup Take 10 mLs by mouth every 4 (four) hours as needed for cough.    Yes [provider]  hydrALAZINE (APRESOLINE) 50 MG tablet Take 50 mg by mouth 3 (three) times daily.    Yes [provider]  insulin glargine (LANTUS) 100 UNIT/ML injection Inject 4 Units at bedtime into the skin.    Yes [provider]  mirtazapine (REMERON) 15 MG tablet Take 15 mg by mouth at bedtime.   Yes [provider]  sildenafil (VIAGRA) 100 MG tablet Take 100 mg daily as needed by mouth for erectile dysfunction.   Yes [provider]  sodium bicarbonate 650 MG tablet Take 650 mg by mouth 2 (two) times daily.    Yes [provider]  terazosin (HYTRIN) 5 MG capsule Take 5 mg at bedtime by mouth.   Yes [provider]   Allergies  Allergen Reactions  . Penicillins Nausea And Vomiting and Rash    Has patient had a PCN reaction causing immediate rash, facial/tongue/throat swelling, SOB or lightheadedness with hypotension: No Has patient had a PCN reaction causing severe rash involving mucus membranes or skin necrosis: No Has patient had a PCN reaction that required hospitalization: No Has patient had a PCN reaction occurring within the last 10 years: No If all of the above answers are "NO", then may proceed with Cephalosporin use.    Review of Systems  Physical Exam  Vital Signs: BP (!)  193/80   Pulse 93   Temp 97.6 F (36.4 C) (Oral)   Resp 18   Ht _0  (1.702 m)   Wt 83.6 kg (184 lb 4.9 oz)   SpO2 95%   BMI 28.87 kg/m  Pain Scale: CPOT       SpO2: SpO2: 95 % O2 Device:SpO2: 95 % O2 Flow Rate: .   IO: Intake/output summary:   Intake/Output Summary (Last 24 hours) at 07/20/2017 1646 Last data filed at 07/20/2017 1600 Gross per 24 hour  Intake 1481.04 ml  Output 0 ml  Net 1481.04 ml    LBM: Last BM Date: 07/17/17 Baseline Weight: Weight: 84.5 kg (186 lb 4.6 oz) Most recent weight: Weight: 83.6 kg (184 lb 4.9 oz)     Palliative Assessment/Data: 10%     Time In: 3:10 Time Out: 4:40 Time Total: 90 min Greater than 50%  of this time was spent counseling and coordinating care related to the above assessment and plan.  Signed by: Asencion Gowda, NP   Please contact Palliative Medicine Team phone at [phone] for questions and concerns.  For individual provider: See Shea Evans

## 2017-07-20 NOTE — Progress Notes (Signed)
[facility] ROUNDING NOTE   Subjective:   72 year old male history of end-stage renal disease ( started Nov 2018) on hemodialysis via right PermCath, insulin-dependent diabetes mellitus who was found down on a wellness check. Police found him unresponsive with vomitus throughout his home and when EMS arrived he was noted to have pinpoint pupils.  There is also report of an empty pill bottle that actually belonged to his friend who normally gives him rides to dialysis, they stated that the name started with hydro-and they believed it to be a narcotic. In the emergency room the patient was found to be hyperkalemic and ws started on CRRT   The permcath has had poor function during CRRT and clotted multiple times 6/17 despite use of TPA and heparin in the circuit   It was decided to transition the patient to intermittent hemodialysis particularly as the patient is now hemodynamically stable and is no longer requiring pressors. EEG  findings indicates diffuse cerebral dysfunction that is non-specific in etiology and can be seen with hypoxic/ischemic injury, toxic/metabolic encephalopathies, neurodegenerative disorders, or medication effect.  Remains intubated. Attempts are being made to keep friends and family informed. Decisions about goals of care would be appropriate at this stage- it looks like we are heading to palliative care   Mental condition does not allow for intubation. Pupils are sluggish   During dialysis on 6/20 he developed an acute pulmonary embolism, PEA arrest and required CPR for 12 minutes. He had VFib and required dc cardioversion   He received TPA and is now on vasopressin, levophed and dopamine as pressor requirements    He continues on three pressors  A family meeting occurred 6/21 and at this time no decision has been made to withdraw care. He is not stable for intermittent dialysis     Discussed with Dr Lake Bells  The patient continues to display clinical signs and  hypotension and shock  We will not initiate CRRT at this point Family have agreed to DNR status  This morning he continues intubated and 3 pressors with a MAP 84 , oxygen requirements 45% and sats 92 no evidence of hyperkalemia/ metabolic acidosis BUN/Cr  105 / 9.83  on labs  Continues on IV heparin       Objective:  Vital signs in last 24 hours:  Temp:  [98.2 F (36.8 C)-102.3 F (39.1 C)] 100.3 F (37.9 C) (06/23 0800) Pulse Rate:  [69-93] 91 (06/23 1100) Resp:  [12-31] 18 (06/23 1100) BP: (103-143)/(40-81) 133/67 (06/23 1100) SpO2:  [85 %-100 %] 85 % (06/23 1100) Arterial Line BP: (78-178)/(25-51) 137/39 (06/23 0800) FiO2 (%):  [45 %-50 %] 45 % (06/23 1109) Weight:  [184 lb 4.9 oz (83.6 kg)] 184 lb 4.9 oz (83.6 kg) (06/23 0500)  Weight change: -1 lb 5.2 oz (-0.6 kg) Filed Weights   07/18/17 0400 07/19/17 0500 07/20/17 0500  Weight: 181 lb 3.5 oz (82.2 kg) 185 lb 10 oz (84.2 kg) 184 lb 4.9 oz (83.6 kg)    Intake/Output: I/O last 3 completed shifts: In: 4790.5 [I.V.:2954.2; NG/GT:1560; IV Piggyback:276.3] Out: 1000 [Stool:1000]   Intake/Output this shift:  Total I/O In: 171.8 [I.V.:171.8] Out: 0   Unresponsive to voice  Withdraws to pain and has sluggish pupils  ETT  CVS- RRR   RS- CTA mechanical breath sounds on vent  ABD- BS present soft non-distended EXT- no edema LUE thrill  AVF    Basic Metabolic Panel: Recent Labs  Lab 07/17/17 0913 07/17/17 1948 07/18/17 0345 07/18/17  1529 07/19/17 0513 07/19/17 0514 07/19/17 1531 07/20/17 0420  NA 140 142 139 141  --  141 142 145  K 3.5 4.3 3.6 3.9  --  3.4* 3.7 3.8  CL 100* 98* 93* 88*  --  84* 83* 87*  CO2 22 13* 15* 19*  --  '25 25 22  '$ GLUCOSE 333* 65 186* 242*  --  216* 162* 134*  BUN 80* 42* 54* 68*  --  82* 92* 105*  CREATININE 7.94* 5.35* 5.92* 6.69*  --  7.74* 8.34* 9.83*  CALCIUM 9.2 10.3 8.4* 8.3*  --  8.1* 8.5* 8.6*  MG 2.6* 3.0* 2.4  --  2.3  --   --  2.7*  PHOS 7.2* 13.4* 8.8* 11.2*  --  11.5*  12.8* 3.4    Liver Function Tests: Recent Labs  Lab 07/17/17 1948 07/18/17 0345 07/18/17 1529 07/19/17 0514 07/19/17 1531  ALBUMIN 2.1* 1.8* 1.7* 1.6* 1.6*   No results for input(s): LIPASE, AMYLASE in the last 168 hours. Recent Labs  Lab 07/15/17 1952  AMMONIA 23    CBC: Recent Labs  Lab 07/17/17 1948 07/18/17 0345 07/18/17 0922 07/19/17 0513 07/20/17 0420  WBC 41.4* 26.3* 23.1* 21.9* 25.9*  NEUTROABS  --   --   --  19.6*  --   HGB 8.0* 8.2* 8.1* 8.2* 8.0*  HCT 25.8* 24.3* 23.8* 23.6* 24.3*  MCV 84.6 78.1 78.3 76.6* 78.6  PLT 156 147*  146* 144* 140* 128*    Cardiac Enzymes: Recent Labs  Lab 07/13/17 1522  TROPONINI 0.30*    BNP: Invalid input(s): POCBNP  CBG: Recent Labs  Lab 07/19/17 1629 07/19/17 1949 07/20/17 0006 07/20/17 0353 07/20/17 0802  GLUCAP 149* 126* 141* 121* 139*    Microbiology: Results for orders placed or performed during the hospital encounter of 07/11/2017  Culture, respiratory (tracheal aspirate)     Status: None   Collection Time: 07/16/2017  7:50 PM  Result Value Ref Range Status   Specimen Description TRACHEAL ASPIRATE  Final   Special Requests NONE  Final   Gram Stain   Final    ABUNDANT WBC PRESENT, PREDOMINANTLY PMN ABUNDANT GRAM NEGATIVE COCCOBACILLI MODERATE GRAM POSITIVE COCCI    Culture   Final    ABUNDANT HAEMOPHILUS INFLUENZAE BETA LACTAMASE NEGATIVE Performed at [facility], [place] [address] [place], [place] [address]    Report Status 07/15/2017 FINAL  Final  Culture, blood (routine x 2)     Status: None   Collection Time: 07/10/2017  8:20 PM  Result Value Ref Range Status   Specimen Description BLOOD PORTA CATH  Final   Special Requests   Final    BOTTLES DRAWN AEROBIC AND ANAEROBIC Blood Culture adequate volume   Culture   Final    NO GROWTH 5 DAYS Performed at [facility], [place] [address], [place], [place] [address]    Report Status 07/17/2017 FINAL  Final  Culture, blood (routine x 2)      Status: None   Collection Time: 07/06/2017  8:35 PM  Result Value Ref Range Status   Specimen Description BLOOD RIGHT ANTECUBITAL  Final   Special Requests   Final    BOTTLES DRAWN AEROBIC AND ANAEROBIC Blood Culture adequate volume   Culture   Final    NO GROWTH 5 DAYS Performed at [facility], [place] [address] [place], [place] [address]    Report Status 07/17/2017 FINAL  Final  MRSA PCR Screening  Status: None   Collection Time: 07/17/2017  9:14 PM  Result Value Ref Range Status   MRSA by PCR NEGATIVE NEGATIVE Final    Comment:        The GeneXpert MRSA Assay (FDA approved for NASAL specimens only), is one component of a comprehensive MRSA colonization surveillance program. It is not intended to diagnose MRSA infection nor to guide or monitor treatment for MRSA infections. Performed at [facility], [place] [address], [place], [place] [address]     Coagulation Studies: Recent Labs    07/18/17 0345 07/18/17 0922  LABPROT 25.5*  24.9* 25.9*  INR 2.34  2.28 2.40    Urinalysis: No results for input(s): COLORURINE, LABSPEC, PHURINE, GLUCOSEU, HGBUR, BILIRUBINUR, KETONESUR, PROTEINUR, UROBILINOGEN, NITRITE, LEUKOCYTESUR in the last 72 hours.  Invalid input(s): APPERANCEUR    Imaging: Dg Chest Port 1 View  Result Date: 07/20/2017 CLINICAL DATA:  Respiratory failure. EXAM: PORTABLE CHEST 1 VIEW COMPARISON:  07/19/2017 and older exams. FINDINGS: Basilar lung opacities are without significant change from the most recent prior exam. Suspect this residual lung base opacity to all be atelectasis. No new lung abnormalities. No convincing pleural effusion. No pneumothorax. Endotracheal tube, nasogastric tube and right internal jugular tunneled central venous line are stable. IMPRESSION: 1. Mild persistent lung base opacities most likely atelectasis. 2. No new lung abnormalities. 3. Support apparatus is stable. Electronically Signed   By: Lajean Manes M.D.   On:  07/20/2017 09:01   Dg Chest Port 1 View  Result Date: 07/19/2017 CLINICAL DATA:  Acute respiratory failure and hypoxemia. EXAM: PORTABLE CHEST 1 VIEW COMPARISON:  Chest x-ray from yesterday. FINDINGS: Unchanged endotracheal tube, enteric tube, and tunneled right internal jugular dialysis catheter. Stable cardiomediastinal silhouette. Unchanged small left pleural effusion and left greater than right basilar atelectasis. No pneumothorax. No acute osseous abnormality. IMPRESSION: 1. Unchanged small left pleural effusion and bibasilar atelectasis. Electronically Signed   By: Titus Dubin M.D.   On: 07/19/2017 09:01     Medications:   . sodium chloride 10 mL/hr at 07/17/17 2000  . sodium chloride    . sodium chloride    . sodium chloride    . sodium chloride    . sodium chloride    . albumin human 25 g (07/17/17 1737)  . cefTRIAXone (ROCEPHIN)  IV 1 g (07/19/17 2202)  . famotidine (PEPCID) IV 20 mg (07/19/17 2321)  . feeding supplement (VITAL AF 1.2 CAL) 1,000 mL (07/19/17 1202)  . heparin 1,300 Units/hr (07/20/17 1100)  . norepinephrine (LEVOPHED) Adult infusion 20 mcg/min (07/20/17 1100)  . vasopressin (PITRESSIN) infusion - *FOR SHOCK* 0.03 Units/min (07/19/17 1927)   . chlorhexidine gluconate (MEDLINE KIT)  15 mL Mouth Rinse BID  . Chlorhexidine Gluconate Cloth  6 each Topical Q0600  . Chlorhexidine Gluconate Cloth  6 each Topical Q0600  . darbepoetin (ARANESP) injection - DIALYSIS  100 mcg Intravenous Q Thu-HD  . feeding supplement (PRO-STAT SUGAR FREE 64)  30 mL Per Tube Daily  . insulin aspart  0-20 Units Subcutaneous Q4H  . insulin glargine  15 Units Subcutaneous Daily  . mouth rinse  15 mL Mouth Rinse 10 times per day   sodium chloride, sodium chloride, sodium chloride, sodium chloride, sodium chloride, Place/Maintain arterial line **AND** sodium chloride, acetaminophen (TYLENOL) oral liquid 160 mg/5 mL, albumin human, alteplase, docusate, fentaNYL (SUBLIMAZE) injection,  heparin, lidocaine (PF), lidocaine-prilocaine, pentafluoroprop-tetrafluoroeth  Assessment/ Plan:   1. End stage renal disease    Did not complete dialysis yesterday  due to cardiac arrest and massive pulmonary embolus 6/20 now on pressor medications and doubt that he could tolerate intermittent hemodialysis  No escalation of care decided by Dr Lake Bells  2. Acute respiratory Failure   Treated with vanc and zosyn /  He was also given ceftriaxone  Both  stopped  White count continues to increase   Mental status making weaning off ventilator difficult 3. Catheter malfunction     No plans for CRRT 4. Diabetes stable  5. Acute encephalopathy  EEG as above and moving towards establishing goals of care  6. Anemia  Decreased hemoglobin will started darbepoietin  (  6/20)   Iron stores Tsats 61 %  ( 6/20) 7. Pulmonary Embolus and cardiac arrest  tpa administered  / heparin IV  8. Hypotension    IV pressors   Dopamine  Vasopressin and Norepinephrine     Discussed with Dr Lake Bells - he is does not think that nephrology services will be needed and is agreeable to allowing [place] to sign off.  Thank you for allowing [place] to participate in the care of this complex patient     LOS: 8 Reana [place]o W '@TODAY''@11'$ :40 AM

## 2017-07-20 NOTE — Progress Notes (Signed)
ANTICOAGULATION CONSULT NOTE  Pharmacy Consult for Heparin Indication: pulmonary embolus  Allergies  Allergen Reactions  . Penicillins Nausea And Vomiting and Rash    Has patient had a PCN reaction causing immediate rash, facial/tongue/throat swelling, SOB or lightheadedness with hypotension: No Has patient had a PCN reaction causing severe rash involving mucus membranes or skin necrosis: No Has patient had a PCN reaction that required hospitalization: No Has patient had a PCN reaction occurring within the last 10 years: No If all of the above answers are "NO", then may proceed with Cephalosporin use.     Patient Measurements: Height: 5\' 7"  (170.2 cm) Weight: 184 lb 4.9 oz (83.6 kg) IBW/kg (Calculated) : 66.1 Heparin Dosing Weight: 82.2 kg  Vital Signs: Temp: 100.3 F (37.9 C) (06/23 0800) Temp Source: Oral (06/23 0800) BP: 119/68 (06/23 1000) Pulse Rate: 93 (06/23 0800)  Labs: Recent Labs    07/18/17 0345 07/18/17 0922 07/18/17 1354 07/18/17 1529 07/18/17 1730 07/19/17 0512 07/19/17 0513 07/19/17 0514 07/19/17 1531 07/20/17 0420  HGB 8.2* 8.1*  --   --   --   --  8.2*  --   --  8.0*  HCT 24.3* 23.8*  --   --   --   --  23.6*  --   --  24.3*  PLT 147*  146* 144*  --   --   --   --  140*  --   --  128*  APTT 65* 77* 91* 88* 82*  --   --   --   --   --   LABPROT 25.5*  24.9* 25.9*  --   --   --   --   --   --   --   --   INR 2.34  2.28 2.40  --   --   --   --   --   --   --   --   HEPARINUNFRC  --   --   --   --   --  0.22*  --   --  0.28* 0.37  CREATININE 5.92*  --   --  6.69*  --   --   --  7.74* 8.34* 9.83*    Estimated Creatinine Clearance: 7 mL/min (A) (by C-G formula based on SCr of 9.83 mg/dL (H)).  Assessment: 72 year old male with PE s/p tPA. Pharmacy now consulted to dose heparin. Heparin level this morning is therapeutic at 0.37. CBC and platelets down slightly. No signs of bleeding noted.      Goal of Therapy:  Heparin level goal 0.3 to 0.7   Monitor platelets by anticoagulation protocol: Yes   Plan:  Continue heparin at 1300 units/hr.  Daily heparin level/CBC Monitor for signs/symptoms of bleeding   Sharin MonsEmily Ardelle Haliburton, PharmD, BCPS PGY2 Infectious Diseases Pharmacy Resident Phone: [phone] 07/20/2017 10:48 AM

## 2017-07-20 NOTE — Progress Notes (Addendum)
Called [facility], informed nurse Jody pt with emesis appearance of tan colored tube feeding noted while tube feeds on hold during bath. No change in ogt marking, placement checked with air bolus. Tube feeding stopped and ogt placed to intermittent suction, 600cc tube feeding drainage noted. Will leave ogt to intermittent suction.

## 2017-07-20 NOTE — Progress Notes (Signed)
PULMONARY / CRITICAL CARE MEDICINE   Name: [name] Dudley MRN: [id number] DOB: [birth year]    ADMISSION DATE:  07/15/2017 CONSULTATION DATE:  06/30/2017  REFERRING MD:  Meridee Score, MD  CHIEF COMPLAINT:  Unresponsive, found down  HISTORY OF PRESENT ILLNESS:   72 y/o male with ESRD on HD presented on 6/15 after a wellness check found him down.  The initial presentation was concerning for a possible narcotic overdose.  He was treated for pneumonia and required vasopressors.  His mental status didn't improve so an MRI brain was ordered which showed nothing acute.  While receiving hemodialysis on 6/20 he had a cardiac arrest due to a PE.    SUBJECTIVE:   Pt with progressive worsening yesterday with increased pressor demands and continued unresponsiveness . Family meeting with PCCM , pt DNR , no escalation of care  Remains unresponsive , on high pressor support.     VITAL SIGNS: BP 119/68   Pulse 93   Temp 100.3 F (37.9 C) (Oral)   Resp 17   Ht 5\' 7"  (1.702 m)   Wt 184 lb 4.9 oz (83.6 kg)   SpO2 100%   BMI 28.87 kg/m   HEMODYNAMICS:    VENTILATOR SETTINGS: Vent Mode: PRVC FiO2 (%):  [45 %-50 %] 45 % Set Rate:  [16 bmp-18 bmp] 18 bmp Vt Set:  [530 mL] 530 mL PEEP:  [5 cmH20] 5 cmH20 Pressure Support:  [12 cmH20] 12 cmH20 Plateau Pressure:  [15 cmH20-20 cmH20] 17 cmH20  INTAKE / OUTPUT: I/O last 3 completed shifts: In: 4790.5 [I.V.:2954.2; NG/GT:1560; IV Piggyback:276.3] Out: 1000 [Stool:1000]  PHYSICAL EXAMINATION:  General: Chronically ill-appearing male on vent  HENT: NCAT, ET tube  PULM: Coarse breath sounds on the vent CV: Irregular with no murmur rub or gallop GI: Bowel sounds hypoactive  MSK: Intact Neuro: Unresponsive on no sedation    LABS:  BMET Recent Labs  Lab 07/19/17 0514 07/19/17 1531 07/20/17 0420  NA 141 142 145  K 3.4* 3.7 3.8  CL 84* 83* 87*  CO2 25 25 22   BUN 82* 92* 105*  CREATININE 7.74* 8.34* 9.83*  GLUCOSE 216* 162* 134*     Electrolytes Recent Labs  Lab 07/18/17 0345  07/19/17 0513 07/19/17 0514 07/19/17 1531 07/20/17 0420  CALCIUM 8.4*   < >  --  8.1* 8.5* 8.6*  MG 2.4  --  2.3  --   --  2.7*  PHOS 8.8*   < >  --  11.5* 12.8* 3.4   < > = values in this interval not displayed.    CBC Recent Labs  Lab 07/18/17 0922 07/19/17 0513 07/20/17 0420  WBC 23.1* 21.9* 25.9*  HGB 8.1* 8.2* 8.0*  HCT 23.8* 23.6* 24.3*  PLT 144* 140* 128*    Coag's Recent Labs  Lab 07/18/17 0345 07/18/17 0922 07/18/17 1354 07/18/17 1529 07/18/17 1730  APTT 65* 77* 91* 88* 82*  INR 2.34  2.28 2.40  --   --   --     Sepsis Markers Recent Labs  Lab 07/14/17 0040 07/14/17 0452  07/14/17 0628 07/16/17 1232 07/17/17 0913 07/18/17 0345  LATICACIDVEN 5.4* 3.9*  --  2.1*  --   --   --   PROCALCITON  --   --    < > 89.97 71.75 58.46 51.54   < > = values in this interval not displayed.    ABG Recent Labs  Lab 07/17/17 1930 07/17/17 2337 07/18/17 2343  PHART 7.045* 7.385 7.516*  PCO2ART 44.0 28.8* 31.6*  PO2ART 122* 185.0* 99.0    Liver Enzymes Recent Labs  Lab 07/18/17 1529 07/19/17 0514 07/19/17 1531  ALBUMIN 1.7* 1.6* 1.6*    Cardiac Enzymes Recent Labs  Lab 07/13/17 1522  TROPONINI 0.30*    Glucose Recent Labs  Lab 07/19/17 1157 07/19/17 1629 07/19/17 1949 07/20/17 0006 07/20/17 0353 07/20/17 0802  GLUCAP 175* 149* 126* 141* 121* 139*    Imaging Dg Chest Port 1 View  Result Date: 07/20/2017 CLINICAL DATA:  Respiratory failure. EXAM: PORTABLE CHEST 1 VIEW COMPARISON:  07/19/2017 and older exams. FINDINGS: Basilar lung opacities are without significant change from the most recent prior exam. Suspect this residual lung base opacity to all be atelectasis. No new lung abnormalities. No convincing pleural effusion. No pneumothorax. Endotracheal tube, nasogastric tube and right internal jugular tunneled central venous line are stable. IMPRESSION: 1. Mild persistent lung base  opacities most likely atelectasis. 2. No new lung abnormalities. 3. Support apparatus is stable. Electronically Signed   By: Amie Portland M.D.   On: 07/20/2017 09:01     STUDIES:  6/15  CTH >> small vessel ischemic disease, no acute abnormality  6/16  EEG >> abnormal due to the presence of moderate diffuse background slowing with triphasic waves. This is non-specific in etiology and can be seen with hypoxic/ischemic injury, toxic/metabolic encephalopathies, neurodegenerative disorders, or medication effect.  6/19 MRI brain>>> 1. No acute intracranial abnormality. 2. Small remote ventral right pontine infarct. 3. Mild chronic small vessel ischemic disease. 6/20 CT angiogram>  Left lower lobe pulmonary embolism, cavitary lesion left lower lobe, possible infarct, atelectasis RLL  CULTURES: BCx2 6/15 >>NEG  Tracheal aspirate 6/15 >>abundant GNC, mod GPC>>> H Flu, Beta Lactamase Negative  ANTIBIOTICS: Vancomycin 6/15 >>6/18 Zosyn 6/16 >> 6/18 Ceftriaxone 6/18>>>  SIGNIFICANT EVENTS: 6/15  Admit with AMS, found down at home, ? narc contribution, intubated in ER  LINES/TUBES: ETT 6/15 >> R chest perm cath (pta)>>>  DISCUSSION: 72 year old male history of end-stage renal disease on hemodialysis with acute respiratory failure, AMS, aspiration PNA.   He has persistent encephalopathy which has been poorly understood.  On 6/20 he had a cardiac arrest due to a PE.  Underwent TPA on 6/21 .  Unresponsive with increased pressor demands, too unstable for replacement HD cath and CRRT . DNR/no escalation on 6/22   ASSESSMENT / PLAN:  PULMONARY A: Acute respiratory failure  PNA - H Flu  Pulmonary embolism s/p TPA 6/21  P:   Full mechanical vent support VAP prevention Daily WUA/SBT Hep IV  CARDIOVASCULAR A:  Irregular rhtyhm - ? type II mobitz. Suspect r/t metabolic derangements.  Improving.  H/o HTN -  Echo EF 55%,  Cardiogenic shock from massive pulmonary embolism; suspect embolism  was from the perm cath  P:  Tele Wean levophed and vasopressin as able    RENAL A:   ESRD with uremia  Hyperkalemia-resolved  Relatively new fistula P:   Monitor BMET and UOP Replace electrolytes as needed Too unstable for HD cath and CRRT. No escalation of care, DNR    GASTROINTESTINAL A:   NPO -vomitted 6/23  P:   TF on hold  Continue medical stress ulcer prophylaxis  HEMATOLOGIC A:   Acute pulmonary embolism s/p TPA 6/21  P: Continue on Hep IV   INFECTIOUS A:   Aspiration pneumonia  P:   Remains on ceftriaxone, plan 8 days  ENDOCRINE A:   Diabetes mellitus    P: CBG q4, SSI  NEUROLOGIC  A:   Acute Metabolic Encephalopathy - unclear etiology -- suspected uremia/hepatic encephalopathy but no improvement with HD, lactulose. MRI neg.  Mental status not significantly improved 6/19 despite CRRT, HD, improved ammonia  MRI neg acute 6/19 Remained unresponsive off sedation for several days  P:   RASS goal 0 Remains off sedation> 72 hours No escalation of care   FAMILY  - Updates: Updated family at length at bedside (son, daughter) on 6/21. I explained that his prognosis was poor prior to his cardiac arrest due to his multiple comorbid illnesses and persistent encephalopathy.  Now post cardiac arrest his prognosis is worse.  However he is not brain dead and at this point I don't believe has an incurable condition.  We will treat the massive PE with TPA and then will resume care plan outlined earlier in the week and plan neuro consult if no improvement. -family meeting 6/22 , DNR , no escalation of care  - Inter-disciplinary family meet or Palliative Care meeting due by Day 7  Keyshia Orwick NP-C  [facility]  [phone]   07/20/2017

## 2017-07-21 LAB — MAGNESIUM: MAGNESIUM: 2.6 mg/dL — AB (ref 1.7–2.4)

## 2017-07-21 LAB — RENAL FUNCTION PANEL
ALBUMIN: 1.6 g/dL — AB (ref 3.5–5.0)
ALBUMIN: 1.5 g/dL — AB (ref 3.5–5.0)
Anion gap: 34 — ABNORMAL HIGH (ref 5–15)
Anion gap: 35 — ABNORMAL HIGH (ref 5–15)
BUN: 133 mg/dL — ABNORMAL HIGH (ref 6–20)
BUN: 151 mg/dL — AB (ref 6–20)
CHLORIDE: 89 mmol/L — AB (ref 101–111)
CHLORIDE: 87 mmol/L — AB (ref 101–111)
CO2: 23 mmol/L (ref 22–32)
CO2: 24 mmol/L (ref 22–32)
CREATININE: 11.25 mg/dL — AB (ref 0.61–1.24)
CREATININE: 12.32 mg/dL — AB (ref 0.61–1.24)
Calcium: 8.9 mg/dL (ref 8.9–10.3)
Calcium: 9 mg/dL (ref 8.9–10.3)
GFR calc Af Amer: 4 mL/min — ABNORMAL LOW (ref >60)
GFR, EST AFRICAN AMERICAN: 5 mL/min — AB (ref >60)
GFR, EST NON AFRICAN AMERICAN: 4 mL/min — AB (ref >60)
GFR, EST NON AFRICAN AMERICAN: 4 mL/min — AB (ref >60)
Glucose, Bld: 132 mg/dL — ABNORMAL HIGH (ref 65–99)
Glucose, Bld: 141 mg/dL — ABNORMAL HIGH (ref 65–99)
PHOSPHORUS: 14.9 mg/dL — AB (ref 2.5–4.6)
POTASSIUM: 3.6 mmol/L (ref 3.5–5.1)
POTASSIUM: 3.6 mmol/L (ref 3.5–5.1)
Phosphorus: 14.9 mg/dL — ABNORMAL HIGH (ref 2.5–4.6)
Sodium: 146 mmol/L — ABNORMAL HIGH (ref 135–145)
Sodium: 146 mmol/L — ABNORMAL HIGH (ref 135–145)

## 2017-07-21 LAB — GLUCOSE, CAPILLARY
GLUCOSE-CAPILLARY: 100 mg/dL — AB (ref 65–99)
GLUCOSE-CAPILLARY: 94 mg/dL (ref 65–99)
GLUCOSE-CAPILLARY: 138 mg/dL — AB (ref 65–99)
Glucose-Capillary: 104 mg/dL — ABNORMAL HIGH (ref 65–99)
Glucose-Capillary: 131 mg/dL — ABNORMAL HIGH (ref 65–99)
Glucose-Capillary: 79 mg/dL (ref 65–99)

## 2017-07-21 LAB — HEPARIN LEVEL (UNFRACTIONATED): HEPARIN UNFRACTIONATED: 0.38 [IU]/mL (ref 0.30–0.70)

## 2017-07-21 NOTE — Progress Notes (Signed)
PULMONARY / CRITICAL CARE MEDICINE   Name: [name] Dudley MRN: [id number] DOB: 1945/07/01    ADMISSION DATE:  Aug 07, 2017 CONSULTATION DATE:  2017/08/07  REFERRING MD:  Meridee Score, MD  CHIEF COMPLAINT:  Unresponsive, found down  HISTORY OF PRESENT ILLNESS:   72 y/o male with ESRD on HD presented on 6/15 after a wellness check found him down.  The initial presentation was concerning for a possible narcotic overdose.  He was treated for pneumonia and required vasopressors.  His mental status didn't improve so an MRI brain was ordered which showed nothing acute.  While receiving hemodialysis on 6/20 he had a cardiac arrest due to a PE.    SUBJECTIVE:   Remains unresponsive on pressor support.  Levophed increased to 10 mcg/min to allow family to arrive Family meeting held this morning with PMT 6/24 for possible transition to comfort care however are awaiting for one more family member to arrive and ask for another day.  Family acknowledge that patient may not survive despite current therapies, currently no escalation of care.   VITAL SIGNS: BP (!) 122/55   Pulse 89   Temp 99.7 F (37.6 C) (Oral)   Resp 19   Ht 5\' 7"  (1.702 m)   Wt 184 lb 11.9 oz (83.8 kg)   SpO2 98%   BMI 28.94 kg/m   HEMODYNAMICS:    VENTILATOR SETTINGS: Vent Mode: PRVC FiO2 (%):  [45 %] 45 % Set Rate:  [18 bmp] 18 bmp Vt Set:  [530 mL-550 mL] 550 mL PEEP:  [5 cmH20] 5 cmH20 Plateau Pressure:  [16 cmH20-17 cmH20] 17 cmH20  INTAKE / OUTPUT: I/O last 3 completed shifts: In: 3431.8 [I.V.:1331.8; NG/GT:1800; IV Piggyback:300] Out: 0   PHYSICAL EXAMINATION: General:  Critically ill appearing elderly male in NAD HEENT: pupils 2/ nonreactive, ETT/ OGT Neuro: minimal withdrawal to pain in LLL and +cough with suctioning otherwise unresponsive CV:  rrr, no m/r/g PULM: even/non-labored- occasionally breaths over, lungs coarse, thick tan secretions GI: soft, hypoBS  Extremities: warm/moist, no edema  Skin: no  rashes   LABS:  BMET Recent Labs  Lab 07/20/17 0420 07/20/17 1615 07/21/17 0212  NA 145 143 146*  K 3.8 3.7 3.6  CL 87* 86* 89*  CO2 22 24 23   BUN 105* 119* 133*  CREATININE 9.83* 10.45* 11.25*  GLUCOSE 134* 181* 132*    Electrolytes Recent Labs  Lab 07/19/17 0513  07/20/17 0420 07/20/17 1615 07/21/17 0212  CALCIUM  --    < > 8.6* 8.6* 8.9  MG 2.3  --  2.7*  --  2.6*  PHOS  --    < > 3.4 15.1* 14.9*   < > = values in this interval not displayed.    CBC Recent Labs  Lab 07/18/17 0922 07/19/17 0513 07/20/17 0420  WBC 23.1* 21.9* 25.9*  HGB 8.1* 8.2* 8.0*  HCT 23.8* 23.6* 24.3*  PLT 144* 140* 128*    Coag's Recent Labs  Lab 07/18/17 0345 07/18/17 0922 07/18/17 1354 07/18/17 1529 07/18/17 1730  APTT 65* 77* 91* 88* 82*  INR 2.34  2.28 2.40  --   --   --     Sepsis Markers Recent Labs  Lab 07/16/17 1232 07/17/17 0913 07/18/17 0345  PROCALCITON 71.75 58.46 51.54    ABG Recent Labs  Lab 07/17/17 1930 07/17/17 2337 07/18/17 2343  PHART 7.045* 7.385 7.516*  PCO2ART 44.0 28.8* 31.6*  PO2ART 122* 185.0* 99.0    Liver Enzymes Recent Labs  Lab 07/19/17 1531  07/20/17 1615 07/21/17 0212  ALBUMIN 1.6* 1.6* 1.6*    Cardiac Enzymes No results for input(s): TROPONINI, PROBNP in the last 168 hours.  Glucose Recent Labs  Lab 07/20/17 1139 07/20/17 1623 07/20/17 1959 07/20/17 2344 07/21/17 0340 07/21/17 0749  GLUCAP 147* 138* 134* 143* 100* 79    Imaging No results found.   STUDIES:  6/15  CTH >> small vessel ischemic disease, no acute abnormality  6/16  EEG >> abnormal due to the presence of moderate diffuse background slowing with triphasic waves. This is non-specific in etiology and can be seen with hypoxic/ischemic injury, toxic/metabolic encephalopathies, neurodegenerative disorders, or medication effect.  6/19 MRI brain>>> 1. No acute intracranial abnormality. 2. Small remote ventral right pontine infarct. 3. Mild chronic  small vessel ischemic disease. 6/20 CT angiogram>  Left lower lobe pulmonary embolism, cavitary lesion left lower lobe, possible infarct, atelectasis RLL  CULTURES: BCx2 6/15 >>NEG  Tracheal aspirate 6/15 >>abundant GNC, mod GPC>>> H Flu, Beta Lactamase Negative  ANTIBIOTICS: Vancomycin 6/15 >>6/18 Zosyn 6/16 >> 6/18 Ceftriaxone 6/18>>>  SIGNIFICANT EVENTS: 6/15  Admit with AMS, found down at home, ? narc contribution, intubated in ER 6/21 Cardiac arrest   LINES/TUBES: ETT 6/15 >> R chest perm cath (pta)>>>  DISCUSSION: 72 year old male history of end-stage renal disease on hemodialysis with acute respiratory failure, AMS, aspiration PNA.   He has persistent encephalopathy which has been poorly understood.  On 6/20 he had a cardiac arrest due to a PE.  Underwent TPA on 6/21 .  Unresponsive with increased pressor demands, too unstable for replacement HD cath and CRRT . DNR/no escalation on 6/22.  Plans to transition to comfort care once all of family arrive.  ASSESSMENT / PLAN:  PULMONARY A: Acute respiratory failure  PNA - H Flu  Pulmonary embolism s/p TPA 6/21  P:   Full mechanical vent support VAP prevention Heparin IV per pharmacy   CARDIOVASCULAR A:  Irregular rhtyhm - ? type II mobitz. Suspect r/t metabolic derangements.  Improving.  H/o HTN -  - ongoing cardiogenic shock from PE - Echo EF 55%,  P:  Tele continue levophed at 10 mcg/min and vasopressin as able, no titrating Continue aline  RENAL A:   ESRD with uremia  Hyperkalemia-resolved  Relatively new fistula P:   Monitor BMET and UOP Replace electrolytes as needed Too unstable for HD cath and CRRT. No escalation of care, DNR   GASTROINTESTINAL A:   NPO -vomitted 6/23  P:   TF on hold  Continue medical stress ulcer prophylaxis  HEMATOLOGIC A:   Acute pulmonary embolism s/p TPA 6/21  P: Continue on Hep IV  Stable H/H   INFECTIOUS A:   Aspiration pneumonia  P:   Remains on ceftriaxone,  plan 8 days (stop date 6/25)  ENDOCRINE A:   Diabetes mellitus    P: CBG q4, SSI  NEUROLOGIC A:   Acute Metabolic Encephalopathy - unclear etiology -- suspected uremia/hepatic encephalopathy but no improvement with HD, lactulose. MRI neg.  Mental status not significantly improved 6/19 despite CRRT, HD, improved ammonia  MRI neg acute 6/19 Remained unresponsive off sedation for several days  P:   RASS goal 0 No sedation since 6/22 No escalation of care   FAMILY  - Updates:  PMT following, appreciate assistance - family meeting 6/22 , DNR , no escalation of care  - Will likely transition to full comfort care 6/25, however, it is possible that death can occur on current therapies, family is aware  Posey Boyer, AGACNP-BC [facility] Pgr: [phone] or if no answer [phone] 07/21/2017, 11:29 AM

## 2017-07-21 NOTE — Progress Notes (Signed)
Daily Progress Note   Patient Name: [name] Dudley       Date: 07/21/2017 DOB: 04-22-1945  Age: 72 y.o. MRN#: [id number] Attending Physician: Juanito Doom, MD Primary Care Physician: [facility] Admit Date: 07/17/2017  Reason for Consultation/Follow-up: Establishing goals of care  Subjective: Patient is resting in bed on ventilator. Ex-wife, daughter, and son at bedside. Ex-wife updated. She states they met when she was 70 years old and got married when she was 72 years old.  They are waiting for one more family member to come tomorrow, his son that they just found out about recently. He is coming in from up [place]. We discussed the tenuous nature of his condition. We discussed that we can try to continue care through tomorrow, but despite our efforts, he may pass. DNR in place, and no plans for attempts at resuscitation if his heart stops. Spoke with CCM and RN about plans.    Length of Stay: 9  Current Medications: Scheduled Meds:  . chlorhexidine gluconate (MEDLINE KIT)  15 mL Mouth Rinse BID  . Chlorhexidine Gluconate Cloth  6 each Topical Q0600  . Chlorhexidine Gluconate Cloth  6 each Topical Q0600  . darbepoetin (ARANESP) injection - DIALYSIS  100 mcg Intravenous Q Thu-HD  . feeding supplement (PRO-STAT SUGAR FREE 64)  30 mL Per Tube Daily  . insulin aspart  0-20 Units Subcutaneous Q4H  . insulin glargine  15 Units Subcutaneous Daily  . mouth rinse  15 mL Mouth Rinse 10 times per day    Continuous Infusions: . sodium chloride 10 mL/hr at 07/17/17 2000  . sodium chloride    . sodium chloride    . sodium chloride    . sodium chloride    . sodium chloride    . albumin human 25 g (07/17/17 1737)  . famotidine (PEPCID) IV 20 mg (07/20/17 2242)  . feeding supplement  (VITAL AF 1.2 CAL) 60 mL/hr at 07/21/17 0200  . heparin 1,300 Units/hr (07/21/17 0816)  . norepinephrine (LEVOPHED) Adult infusion 4 mcg/min (07/21/17 0800)  . vasopressin (PITRESSIN) infusion - *FOR SHOCK* 0.03 Units/min (07/20/17 1957)    PRN Meds: sodium chloride, sodium chloride, sodium chloride, sodium chloride, sodium chloride, Place/Maintain arterial line **AND** sodium chloride, acetaminophen (TYLENOL) oral liquid 160 mg/5 mL, albumin human, alteplase, docusate, fentaNYL (SUBLIMAZE) injection,  heparin, lidocaine (PF), lidocaine-prilocaine, pentafluoroprop-tetrafluoroeth  Physical Exam  Pulmonary/Chest:  Ventilator.             Vital Signs: BP (!) 58/48   Pulse 91   Temp 99.7 F (37.6 C) (Oral)   Resp 18   Ht '5\' 7"'$  (1.702 m)   Wt 83.8 kg (184 lb 11.9 oz)   SpO2 100%   BMI 28.94 kg/m  SpO2: SpO2: 100 % O2 Device: O2 Device: Ventilator O2 Flow Rate:    Intake/output summary:   Intake/Output Summary (Last 24 hours) at 07/21/2017 0955 Last data filed at 07/21/2017 0900 Gross per 24 hour  Intake 2433.73 ml  Output 0 ml  Net 2433.73 ml   LBM: Last BM Date: 07/17/17 Baseline Weight: Weight: 84.5 kg (186 lb 4.6 oz) Most recent weight: Weight: 83.8 kg (184 lb 11.9 oz)       Palliative Assessment/Data: 10%      Patient Active Problem List   Diagnosis Date Noted  . Pulmonary embolus ([place])   . Cardiac arrest ([place])   . Acute on chronic respiratory failure ([place])   . Metabolic encephalopathy 42/59/5638  . BPH (benign prostatic hyperplasia) 12/14/2016  . Type II diabetes mellitus with renal manifestations ([place]) 12/14/2016  . Anemia due to end stage renal disease ([place]) 12/13/2016  . Hypocalcemia 12/13/2016  . Hypertension   . ESRD (end stage renal disease) [facility])     Palliative Care Assessment & Plan   Patient Profile: Unable to dialyze due to pressures. During dialysis on 6/20 he developed an acute pulmonary embolism, PEA arrest and required CPR for 12 minutes. He  had VFib and required dc cardioversion He received TPA and is now on vasopressin, levophed and dopamine as pressor requirements.  He remains intubated.    Assessment/Recommendations/Plan:  Please continue care at this time. Will reevaluate tomorrow at 9:30. Waiting for 1 additional family member (son) to come from out of state, who will arrive late tonight.    Code Status:    Code Status Orders  (From admission, onward)        Start     Ordered   07/19/17 1325  Do not attempt resuscitation (DNR)  Continuous    Question Answer Comment  Maintain current active treatments Yes   Do not initiate new interventions Yes      07/19/17 1325    Code Status History    Date Active Date Inactive Code Status Order ID Comments User Context   07/24/2017 1950 07/19/2017 1325 Full Code [id number]  Kloefkorn, [place], MD Inpatient   06/13/2017 0831 06/13/2017 1229 Full Code [id number]  Elam Dutch, MD Inpatient   12/13/2016 2252 12/21/2016 1146 Full Code [id number]  Ivor Costa, MD ED       Prognosis:   Hours - Days  Discharge Planning:  Anticipated Hospital Death  Care plan was discussed with CCM and RN.   Thank you for allowing the Palliative Medicine Team to assist in the care of this patient.   Time In: 9:45 Time Out: 10:30 Total Time 45 min Prolonged Time Billed  no      Greater than 50%  of this time was spent counseling and coordinating care related to the above assessment and plan.  Asencion Gowda, NP  Please contact Palliative Medicine Team phone at [phone] for questions and concerns.

## 2017-07-21 NOTE — Progress Notes (Signed)
CSW continues to follow for further needs. CSW aware that pt remains on vent at 45 percent at this time.   Claude MangesKierra S. Cliffard Hair, MSW, LCSW-A Emergency Department Clinical Social Worker [phone]

## 2017-07-21 NOTE — Progress Notes (Signed)
ANTICOAGULATION CONSULT NOTE   Pharmacy Consult for Heparin Indication: pulmonary embolus  Allergies  Allergen Reactions  . Penicillins Nausea And Vomiting and Rash    Has patient had a PCN reaction causing immediate rash, facial/tongue/throat swelling, SOB or lightheadedness with hypotension: No Has patient had a PCN reaction causing severe rash involving mucus membranes or skin necrosis: No Has patient had a PCN reaction that required hospitalization: No Has patient had a PCN reaction occurring within the last 10 years: No If all of the above answers are "NO", then may proceed with Cephalosporin use.     Patient Measurements: Height: 5\' 7"  (170.2 cm) Weight: 184 lb 11.9 oz (83.8 kg) IBW/kg (Calculated) : 66.1 Heparin Dosing Weight: 82.2 kg  Vital Signs: Temp: 99.7 F (37.6 C) (06/24 0756) Temp Source: Oral (06/24 0756) BP: 122/55 (06/24 1000) Pulse Rate: 89 (06/24 1021)  Labs: Recent Labs    07/18/17 1354 07/18/17 1529 07/18/17 1730  07/19/17 0513  07/19/17 1531 07/20/17 0420 07/20/17 1615 07/21/17 0212  HGB  --   --   --   --  8.2*  --   --  8.0*  --   --   HCT  --   --   --   --  23.6*  --   --  24.3*  --   --   PLT  --   --   --   --  140*  --   --  128*  --   --   APTT 91* 88* 82*  --   --   --   --   --   --   --   HEPARINUNFRC  --   --   --    < >  --   --  0.28* 0.37  --  0.38  CREATININE  --  6.69*  --   --   --    < > 8.34* 9.83* 10.45* 11.25*   < > = values in this interval not displayed.     Assessment: 72 year old male with PE s/p tPA. Pharmacy now consulted to dose heparin. Heparin level this morning is therapeutic . H/h and plts down sightly. No signs of bleeding noted.      Goal of Therapy:  Heparin level goal 0.3 to 0.7  Monitor platelets by anticoagulation protocol: Yes   Plan:  Continue heparin at 1300 units/hr Daily heparin level/CBC Monitor for signs/symptoms of bleeding   Baldemar FridayMasters, Ceri Mayer M  07/21/2017 11:43 AM

## 2017-07-22 LAB — RENAL FUNCTION PANEL
Albumin: 1.5 g/dL — ABNORMAL LOW (ref 3.5–5.0)
Anion gap: 33 — ABNORMAL HIGH (ref 5–15)
BUN: 163 mg/dL — AB (ref 8–23)
CHLORIDE: 88 mmol/L — AB (ref 98–111)
CO2: 25 mmol/L (ref 22–32)
CREATININE: 13.09 mg/dL — AB (ref 0.61–1.24)
Calcium: 9 mg/dL (ref 8.9–10.3)
GFR calc non Af Amer: 3 mL/min — ABNORMAL LOW (ref >60)
GFR, EST AFRICAN AMERICAN: 4 mL/min — AB (ref >60)
Glucose, Bld: 107 mg/dL — ABNORMAL HIGH (ref 70–99)
Phosphorus: 13.9 mg/dL — ABNORMAL HIGH (ref 2.5–4.6)
Potassium: 3.3 mmol/L — ABNORMAL LOW (ref 3.5–5.1)
Sodium: 146 mmol/L — ABNORMAL HIGH (ref 135–145)

## 2017-07-22 LAB — MAGNESIUM: Magnesium: 2.6 mg/dL — ABNORMAL HIGH (ref 1.7–2.4)

## 2017-07-22 LAB — HEPARIN LEVEL (UNFRACTIONATED): Heparin Unfractionated: 0.56 IU/mL (ref 0.30–0.70)

## 2017-07-22 LAB — GLUCOSE, CAPILLARY
Glucose-Capillary: 100 mg/dL — ABNORMAL HIGH (ref 70–99)
Glucose-Capillary: 99 mg/dL (ref 70–99)
Glucose-Capillary: 111 mg/dL — ABNORMAL HIGH (ref 70–99)

## 2017-07-22 MED ORDER — INSULIN ASPART 100 UNIT/ML ~~LOC~~ SOLN: 0.0000 [IU] | SUBCUTANEOUS | Status: DC

## 2017-07-22 MED ORDER — HYDROMORPHONE BOLUS VIA INFUSION
1.0000 mg | INTRAVENOUS | Status: DC | PRN
  Filled 2017-07-22: qty 1

## 2017-07-22 MED ORDER — ACETAMINOPHEN 650 MG RE SUPP: 650.0000 mg | Freq: Four times a day (QID) | RECTAL | Status: DC | PRN

## 2017-07-22 MED ORDER — HALOPERIDOL 0.5 MG PO TABS: 0.5000 mg | ORAL_TABLET | ORAL | Status: DC | PRN

## 2017-07-22 MED ORDER — SODIUM CHLORIDE 0.9 % IV SOLN: 250.0000 mL | INTRAVENOUS | Status: DC | PRN

## 2017-07-22 MED ORDER — POLYVINYL ALCOHOL 1.4 % OP SOLN
1.0000 [drp] | Freq: Four times a day (QID) | OPHTHALMIC | Status: DC | PRN
  Filled 2017-07-22: qty 15

## 2017-07-22 MED ORDER — GLYCOPYRROLATE 0.2 MG/ML IJ SOLN
0.2000 mg | INTRAMUSCULAR | Status: DC | PRN
  Administered 2017-07-22: 0.2 mg via INTRAVENOUS
  Filled 2017-07-22: qty 1

## 2017-07-22 MED ORDER — ONDANSETRON HCL 4 MG/2ML IJ SOLN: 4.0000 mg | Freq: Four times a day (QID) | INTRAMUSCULAR | Status: DC | PRN

## 2017-07-22 MED ORDER — BIOTENE DRY MOUTH MT LIQD: 15.0000 mL | OROMUCOSAL | Status: DC | PRN

## 2017-07-22 MED ORDER — GLYCOPYRROLATE 1 MG PO TABS: 1.0000 mg | ORAL_TABLET | ORAL | Status: DC | PRN

## 2017-07-22 MED ORDER — SODIUM CHLORIDE 0.9% FLUSH: 3.0000 mL | Freq: Two times a day (BID) | INTRAVENOUS | Status: DC

## 2017-07-22 MED ORDER — LORAZEPAM 2 MG/ML IJ SOLN: 1.0000 mg | INTRAMUSCULAR | Status: DC | PRN

## 2017-07-22 MED ORDER — HALOPERIDOL LACTATE 5 MG/ML IJ SOLN: 0.5000 mg | INTRAMUSCULAR | Status: DC | PRN

## 2017-07-22 MED ORDER — LORAZEPAM 2 MG/ML PO CONC: 1.0000 mg | ORAL | Status: DC | PRN

## 2017-07-22 MED ORDER — SODIUM CHLORIDE 0.9 % IV SOLN
1.0000 mg/h | INTRAVENOUS | Status: DC
  Administered 2017-07-22: 1 mg/h via INTRAVENOUS
  Filled 2017-07-22 (×2): qty 5

## 2017-07-22 MED ORDER — LORAZEPAM 2 MG/ML IJ SOLN
1.0000 mg | INTRAMUSCULAR | Status: DC | PRN
  Administered 2017-07-22: 1 mg via INTRAVENOUS
  Filled 2017-07-22: qty 1

## 2017-07-22 MED ORDER — LORAZEPAM 1 MG PO TABS: 1.0000 mg | ORAL_TABLET | ORAL | Status: DC | PRN

## 2017-07-22 MED ORDER — SODIUM CHLORIDE 0.9% FLUSH: 3.0000 mL | INTRAVENOUS | Status: DC | PRN

## 2017-07-22 MED ORDER — GLYCOPYRROLATE 0.2 MG/ML IJ SOLN: 0.2000 mg | INTRAMUSCULAR | Status: DC | PRN

## 2017-07-22 MED ORDER — CHLORHEXIDINE GLUCONATE CLOTH 2 % EX PADS: 6.0000 | MEDICATED_PAD | Freq: Every day | CUTANEOUS | Status: DC

## 2017-07-22 MED ORDER — ONDANSETRON 4 MG PO TBDP
4.0000 mg | ORAL_TABLET | Freq: Four times a day (QID) | ORAL | Status: DC | PRN
  Filled 2017-07-22: qty 1

## 2017-07-22 MED ORDER — ACETAMINOPHEN 325 MG PO TABS: 650.0000 mg | ORAL_TABLET | Freq: Four times a day (QID) | ORAL | Status: DC | PRN

## 2017-07-22 MED ORDER — INSULIN GLARGINE 100 UNIT/ML ~~LOC~~ SOLN: 10.0000 [IU] | Freq: Every day | SUBCUTANEOUS | Status: DC

## 2017-07-22 MED ORDER — HALOPERIDOL LACTATE 2 MG/ML PO CONC: 0.5000 mg | ORAL | Status: DC | PRN

## 2017-07-23 ENCOUNTER — Telehealth: Payer: Self-pay

## 2017-07-23 NOTE — Telephone Encounter (Signed)
On 07/23/17 I [facility] a [facility] The d/c is for burial.  The patient is a patient of Doctor Agarwala.  [facility]  On 07/25/17 I received the d/c back from Doctor Agarwala.  I got the d/c ready and called the funeral home to let them know the d/c is ready for pickup.

## 2017-07-28 MED FILL — Medication: Qty: 1 | Status: AC

## 2017-07-28 NOTE — Progress Notes (Signed)
RT extubated pt with RN per withdrawal protocol. Pt tolerated well.

## 2017-07-28 NOTE — Progress Notes (Addendum)
Daily Progress Note   Patient Name: [name] Dudley       Date: 08/19/17 DOB: 08-19-1945  Age: 72 y.o. MRN#: [id number] Attending Physician: Kipp Brood, MD Primary Care Physician: [facility] Admit Date: 07/16/2017  Reason for Consultation/Follow-up: Establishing goals of care  Subjective: Patient is resting in bed on ventilator. No distress noted. Family did not arrive for 9:30 meeting. Called to speak with daughter Jeannie Done. She states her brother (patient's son) did not leave until this morning, and is on the way here. She states he and Mr. Acebo grandson should arrive tonight. She states the family will be here tomorrow morning at 9:30, and she does not want care deescalated until they are able to see him.  She states God is in control, and she would like to try to maintain for the other family members but understands he may not survive until that time. She states she has POA papers which she will bring tomorrow.   3:20: Called back to bedside. Arrived to find all 3 children (3 children are next of kin) and exwife present. Son arrived today that the family was waiting for. Ms. Keatts has financial POA papers, not medical POA, however all next of kin are present.  Other family members present. Chaplain at bedside. Family would like to withdraw care. Family pastor present. ICU medications discontinued. Dilaudid initiated for pain/SOB. Ativan initiated for anxiety.   MOST form completed for DNR, comfort care, no abx, no IV fluid, no feeding tube.    Patient expired at 6:07pm with family at bedside.    Length of Stay: 10  Current Medications: Scheduled Meds:  . chlorhexidine gluconate (MEDLINE KIT)  15 mL Mouth Rinse BID  . Chlorhexidine Gluconate Cloth  6 each Topical Q0600  .  darbepoetin (ARANESP) injection - DIALYSIS  100 mcg Intravenous Q Thu-HD  . feeding supplement (PRO-STAT SUGAR FREE 64)  30 mL Per Tube Daily  . insulin aspart  0-20 Units Subcutaneous Q4H  . insulin glargine  15 Units Subcutaneous Daily  . mouth rinse  15 mL Mouth Rinse 10 times per day    Continuous Infusions: . sodium chloride 250 mL (08-19-2017 0327)  . sodium chloride    . sodium chloride    . sodium chloride    . sodium chloride    .  sodium chloride    . albumin human 25 g (07/17/17 1737)  . famotidine (PEPCID) IV Stopped (07/21/17 2131)  . feeding supplement (VITAL AF 1.2 CAL) 60 mL/hr at 07/21/17 0200  . norepinephrine (LEVOPHED) Adult infusion 10 mcg/min (08/17/17 0325)  . vasopressin (PITRESSIN) infusion - *FOR SHOCK* 0.03 Units/min (07/21/17 2104)    PRN Meds: sodium chloride, sodium chloride, sodium chloride, sodium chloride, sodium chloride, Place/Maintain arterial line **AND** sodium chloride, acetaminophen (TYLENOL) oral liquid 160 mg/5 mL, albumin human, alteplase, docusate, fentaNYL (SUBLIMAZE) injection, heparin, lidocaine (PF), lidocaine-prilocaine, pentafluoroprop-tetrafluoroeth  Physical Exam  Constitutional: No distress.  Pulmonary/Chest:  Ventilator.             Vital Signs: BP (!) 91/53   Pulse 94   Temp 99.7 F (37.6 C) (Oral)   Resp 18   Ht '5\' 7"'$  (1.702 m)   Wt 82.4 kg (181 lb 10.5 oz)   SpO2 100%   BMI 28.45 kg/m  SpO2: SpO2: 100 % O2 Device: O2 Device: Ventilator O2 Flow Rate:    Intake/output summary:   Intake/Output Summary (Last 24 hours) at 17-Aug-2017 1023 Last data filed at 08-17-17 0800 Gross per 24 hour  Intake 892.84 ml  Output 401 ml  Net 491.84 ml   LBM: Last BM Date: 08/17/17 Baseline Weight: Weight: 84.5 kg (186 lb 4.6 oz) Most recent weight: Weight: 82.4 kg (181 lb 10.5 oz)       Palliative Assessment/Data: 10%    Flowsheet Rows     Most Recent Value  Intake Tab  Referral Department  Hospitalist  Unit at Time of  Referral  Med/Surg Unit  Palliative Care Primary Diagnosis  Other (Comment) [Metabolic encephalopathy]  Date Notified  07/19/17  Palliative Care Type  New Palliative care  Reason for referral  End of Life Care Assistance  Date of Admission  07/20/2017  Date first seen by Palliative Care  07/20/17  # of days Palliative referral response time  1 Day(s)  # of days IP prior to Palliative referral  7  Clinical Assessment  Psychosocial & Spiritual Assessment  Palliative Care Outcomes      Patient Active Problem List   Diagnosis Date Noted  . Pulmonary embolus ([place])   . Cardiac arrest ([place])   . Acute on chronic respiratory failure ([place])   . Metabolic encephalopathy 03/00/9233  . BPH (benign prostatic hyperplasia) 12/14/2016  . Type II diabetes mellitus with renal manifestations ([place]) 12/14/2016  . Anemia due to end stage renal disease ([place]) 12/13/2016  . Hypocalcemia 12/13/2016  . Hypertension   . ESRD (end stage renal disease) [facility])     Palliative Care Assessment & Plan   Patient Profile: Unable to dialyze due to pressures. During dialysis on 6/20 he developed an acute pulmonary embolism, PEA arrest and required CPR for 12 minutes. He had VFib and required dc cardioversion He received TPA and is now on vasopressin, levophed and dopamine as pressor requirements.  He remains intubated.    Assessment/Recommendations/Plan:  Please continue care at this time. Will reevaluate tomorrow at 9:30. Waiting for 1 additional family member (son) to come from [place] of state, who should arrive late tonight.   Addendum: Withdrawal of care today.    Code Status:    Code Status Orders  (From admission, onward)        Start     Ordered   07/19/17 1325  Do not attempt resuscitation (DNR)  Continuous    Question Answer Comment  Maintain current active treatments  Yes   Do not initiate new interventions Yes      07/19/17 1325    Code Status History    Date Active Date Inactive Code Status  Order ID Comments User Context   07/04/2017 1950 07/19/2017 1325 Full Code [id number]  Kloefkorn, [place], MD Inpatient   06/13/2017 0831 06/13/2017 1229 Full Code [id number]  Elam Dutch, MD Inpatient   12/13/2016 2252 12/21/2016 1146 Full Code [id number]  Ivor Costa, MD ED       Prognosis:   Hours - Days  Discharge Planning:  Anticipated Hospital Death  Care plan was discussed with CCM.   Thank you for allowing the Palliative Medicine Team to assist in the care of this patient.   Total Time 15 min 3:20- 6:16 Prolonged Time Billed  yes      Greater than 50%  of this time was spent counseling and coordinating care related to the above assessment and plan.  Asencion Gowda, NP  Please contact Palliative Medicine Team phone at [phone] for questions and concerns.

## 2017-07-28 NOTE — Progress Notes (Signed)
PULMONARY / CRITICAL CARE MEDICINE   Name: [name] Dudley MRN: [id number] DOB: 01-25-1946    ADMISSION DATE:  2017/07/26 CONSULTATION DATE:  07/26/2017  REFERRING MD:  Meridee Score, MD  CHIEF COMPLAINT:  Unresponsive, found down  HISTORY OF PRESENT ILLNESS:   72 y/o male with ESRD on HD presented on 6/15 after a wellness check found him down.  The initial presentation was concerning for a possible narcotic overdose.  He was treated for pneumonia and required vasopressors.  His mental status didn't improve so an MRI brain was ordered which showed nothing acute.  While receiving hemodialysis on 6/20 he had a cardiac arrest due to a PE.    SUBJECTIVE:   Remains on levophed and vasopressin Hgb stable on AM labs Per RN- 2 large bloody/brown bowel movements with clots this am.  Heparin stopped.  Family meeting with PMT scheduled for 0930, family has not arrived yet.   VITAL SIGNS: BP (!) 91/53   Pulse 94   Temp 99.7 F (37.6 C) (Oral)   Resp 18   Ht 5\' 7"  (1.702 m)   Wt 181 lb 10.5 oz (82.4 kg)   SpO2 100%   BMI 28.45 kg/m   HEMODYNAMICS:    VENTILATOR SETTINGS: Vent Mode: PRVC FiO2 (%):  [40 %-45 %] 40 % Set Rate:  [18 bmp] 18 bmp Vt Set:  [530 mL] 530 mL PEEP:  [5 cmH20] 5 cmH20 Plateau Pressure:  [17 cmH20-18 cmH20] 18 cmH20  INTAKE / OUTPUT: I/O last 3 completed shifts: In: 2970.9 [I.V.:1270.9; NG/GT:1500; IV Piggyback:200] Out: 401 [Emesis/NG output:400; Stool:1]  PHYSICAL EXAMINATION: General:  Critically ill male on MV in NAD HEENT: MM pink/moist, pupils 2/sluggish, ETT/ OGT- brownish output Neuro: Minimally withdrawals to pain on LLE and + cough otherwise unresponsive  CV:  rrr, no m/r/g, AVF LUE, R IJ TDC  PULM: even/non-labored on MV, lungs bilaterally rhonchi, tan secretions GI: soft, no BS appreciated, small amount of hematochezia on bed Extremities: warm/dry, no edema  Skin: no rashes   LABS:  BMET Recent Labs  Lab 07/21/17 0212 07/21/17 1356  07/24/2017 0504  NA 146* 146* 146*  K 3.6 3.6 3.3*  CL 89* 87* 88*  CO2 23 24 25   BUN 133* 151* 163*  CREATININE 11.25* 12.32* 13.09*  GLUCOSE 132* 141* 107*    Electrolytes Recent Labs  Lab 07/20/17 0420  07/21/17 0212 07/21/17 1356 07/11/2017 0504  CALCIUM 8.6*   < > 8.9 9.0 9.0  MG 2.7*  --  2.6*  --  2.6*  PHOS 3.4   < > 14.9* 14.9* 13.9*   < > = values in this interval not displayed.    CBC Recent Labs  Lab 07/18/17 0922 07/19/17 0513 07/20/17 0420  WBC 23.1* 21.9* 25.9*  HGB 8.1* 8.2* 8.0*  HCT 23.8* 23.6* 24.3*  PLT 144* 140* 128*    Coag's Recent Labs  Lab 07/18/17 0345 07/18/17 0922 07/18/17 1354 07/18/17 1529 07/18/17 1730  APTT 65* 77* 91* 88* 82*  INR 2.34  2.28 2.40  --   --   --     Sepsis Markers Recent Labs  Lab 07/16/17 1232 07/17/17 0913 07/18/17 0345  PROCALCITON 71.75 58.46 51.54    ABG Recent Labs  Lab 07/17/17 1930 07/17/17 2337 07/18/17 2343  PHART 7.045* 7.385 7.516*  PCO2ART 44.0 28.8* 31.6*  PO2ART 122* 185.0* 99.0    Liver Enzymes Recent Labs  Lab 07/21/17 0212 07/21/17 1356 07/19/2017 0504  ALBUMIN 1.6* 1.5* 1.5*  Cardiac Enzymes No results for input(s): TROPONINI, PROBNP in the last 168 hours.  Glucose Recent Labs  Lab 07/21/17 1139 07/21/17 1555 07/21/17 1919 07/21/17 2330 07/26/2017 0315 07/15/2017 0742  GLUCAP 94 138* 104* 131* 100* 99    Imaging No results found.   STUDIES:  6/15  CTH >> small vessel ischemic disease, no acute abnormality  6/16  EEG >> abnormal due to the presence of moderate diffuse background slowing with triphasic waves. This is non-specific in etiology and can be seen with hypoxic/ischemic injury, toxic/metabolic encephalopathies, neurodegenerative disorders, or medication effect.  6/19 MRI brain>>> 1. No acute intracranial abnormality. 2. Small remote ventral right pontine infarct. 3. Mild chronic small vessel ischemic disease. 6/20 CT angiogram>  Left lower lobe  pulmonary embolism, cavitary lesion left lower lobe, possible infarct, atelectasis RLL  CULTURES: BCx2 6/15 >>NEG  Tracheal aspirate 6/15 >>abundant GNC, mod GPC>>> H Flu, Beta Lactamase Negative  ANTIBIOTICS: Vancomycin 6/15 >>6/18 Zosyn 6/16 >> 6/18 Ceftriaxone 6/18>>> 6/24  SIGNIFICANT EVENTS: 6/15  Admit with AMS, found down at home, ? narc contribution, intubated in ER 6/21 Cardiac arrest   LINES/TUBES: ETT 6/15 >> R chest perm cath (pta)>>>  DISCUSSION: 72 year old male history of end-stage renal disease on hemodialysis with acute respiratory failure, AMS, aspiration PNA.   He has persistent encephalopathy which has been poorly understood.  On 6/20 he had a cardiac arrest due to a PE.  Underwent TPA on 6/21 .  Unresponsive with increased pressor demands, too unstable for replacement HD cath and CRRT . DNR/no escalation on 6/22.  Plans to transition to comfort care once all of family arrive.  ASSESSMENT / PLAN:  PULMONARY A: Acute respiratory failure  PNA - H Flu  Pulmonary embolism s/p TPA 6/21  P:   Full mechanical vent support VAP prevention Holding heparin 2/2 hematochezia 6/25  CARDIOVASCULAR A:  Irregular rhtyhm - ? type II mobitz. Suspect r/t metabolic derangements.  Improving.  H/o HTN -  - ongoing cardiogenic shock from PE - Echo EF 55%,  P:  Tele continue levophed at 10 mcg/min and vasopressin as able, no titrating DNR, no escalation of care  RENAL A:   ESRD with uremia  Hyperkalemia-resolved  Relatively new fistula P:   Monitor BMET and UOP Replace electrolytes as needed Too unstable for HD cath and CRRT. No escalation of care, DNR   GASTROINTESTINAL A:   NPO -vomitted 6/23  Hematochezia - 6/25 x 2 large BM P:   pepcid for SUP NPO  HEMATOLOGIC A:   Acute pulmonary embolism s/p TPA 6/21  P: Holding heparin secondary to rectal bleeding Trend CBC Transfuse for Hgb < 7  INFECTIOUS A:   Aspiration pneumonia  P:   Completed  ceftriaxone 6/24  Monitor off abx for now; given prognosis and awaiting transition to comfort care   ENDOCRINE A:   Diabetes mellitus    P: CBG q4 Decreasing glucose while NPO Decrease lantus to 10 units daily Decrease SSI to sensitive   NEUROLOGIC A:   Acute Metabolic Encephalopathy - unclear etiology -- suspected uremia/hepatic encephalopathy but no improvement with HD, lactulose. MRI neg.  Mental status not significantly improved 6/19 despite CRRT, HD, improved ammonia  MRI neg acute 6/19 P:   RASS goal 0 No sedation since 6/22, remains unresponsive  No escalation of care   FAMILY  - Updates:  PMT following, appreciate assistance - family meeting 6/22 , DNR , no escalation of care  - Awaiting a son, who apparently no  family knew about to arrive from [place], expected to have arrived 6/24 but now will be tonight, possibly 6/26 am.  Family is aware that patient could pass on current therapies.     Posey Boyer, AGACNP-BC [facility] Pgr: [phone] or if no answer [phone] 07/03/2017, 9:49 AM

## 2017-07-28 NOTE — Death Summary Note (Signed)
DEATH SUMMARY   Patient Details  Name: [name] Dudley MRN: [id number] DOB: 06/16/1945  Admission/Discharge Information   Admit Date:  August 05, 2017  Date of Death: Date of Death: 2017/08/15  Time of Death: Time of Death: Jun 28, 1804  Length of Stay: 07/01/2022  Referring Physician: [facility], [facility]   Reason(s) for Hospitalization  Obtundaation.  Diagnoses  Preliminary cause of death:  Secondary Diagnoses (including complications and co-morbidities):  Active Problems:   Metabolic encephalopathy   Cardiac arrest (HCC)   Acute on chronic respiratory failure [facility])   Pulmonary embolus [facility])   Brief Hospital Course (including significant findings, care, treatment, and services provided and events leading to death)  [name] Dudley is a 72 y.o. year old male with ESRD on IHD.  Presented with confusion lethargy. Treated iniitally for pneumonia.  Subsequent cardiac arrest with poor neurological recovery from massive clot. Given limited therapeutic options and desire of family to make patient more comfortable- care was withdrawn.    Pertinent Labs and Studies  Significant Diagnostic Studies Ct Head Wo Contrast  Result Date: Aug 05, 2017 CLINICAL DATA:  Altered level of consciousness unexplained. EXAM: CT HEAD WITHOUT CONTRAST TECHNIQUE: Contiguous axial images were obtained from the base of the skull through the vertex without intravenous contrast. COMPARISON:  None. FINDINGS: Brain: Mild age related involutional changes of the brain with chronic appearing small vessel ischemic disease of periventricular white matter. No large vascular territory infarct, hemorrhage or midline shift. No effacement of the basal cisterns or fourth ventricle. No intra-axial mass nor extra-axial fluid collections. Vascular: No hyperdense vessel or unexpected calcification. Skull: Negative for fracture or focal osseous lesions. Sinuses/Orbits: No acute finding. Other: Partially included endotracheal and gastric tubes are noted.  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 No acute intracranial abnormality. Electronically Signed   By: Tollie Eth M.D.   On: 08-05-2017 19:07   Ct Angio Chest Pe W Or Wo Contrast  Result Date: 07/18/2017 CLINICAL DATA:  Pulmonary embolus suspected. High pretest probability. History of diabetes. EXAM: CT ANGIOGRAPHY CHEST WITH CONTRAST TECHNIQUE: Multidetector CT imaging of the chest was performed using the standard protocol during bolus administration of intravenous contrast. Multiplanar CT image reconstructions and MIPs were obtained to evaluate the vascular anatomy. CONTRAST:  ISOVUE-370 IOPAMIDOL (ISOVUE-370) INJECTION 76% COMPARISON:  None. FINDINGS: Cardiovascular: Good opacification of the central and segmental pulmonary arteries. Filling defects in the left lower lobe pulmonary arteries consistent with acute pulmonary embolus. Normal caliber thoracic aorta without evidence of dissection. Great vessel origins are patent. Aortic and coronary artery calcifications. Normal heart size. No pericardial effusion. Mediastinum/Nodes: Endotracheal and enteric tubes are present. Endotracheal tube tip is above the carina. Enteric tube tip is in the stomach. Esophagus is decompressed. No significant lymphadenopathy. Thyroid gland is unremarkable. Lungs/Pleura: Consolidation or atelectasis in both lung bases. Decreased enhancement of the consolidated area in the left lung base with some cystic change likely indicates pulmonary infarct. No pleural effusions. No pneumothorax. Emphysematous changes in the upper lungs. Patchy areas of airspace disease throughout the lungs with bronchial opacification. This could be due to bronchopneumonia or aspiration. Upper Abdomen: No acute abnormality. Musculoskeletal: Degenerative changes in the spine. Diffuse bone sclerosis could indicate metabolic bone disease such as renal osteodystrophy or may be related to sickle cell disease. Multiple Schmorl's nodes are  present. Lucent mildly expansile lesion in the spinous process of T1 possibly representing bone cyst. Nonaggressive appearance is suggested. Review of the MIP images confirms the above findings. IMPRESSION: 1. Positive examination for pulmonary embolus with  filling defects in the left lower lobe pulmonary arteries. 2. Consolidation or atelectasis in both lung bases. Probable left lower lobe pulmonary infarct. 3. Patchy airspace disease in both lungs with bronchial opacification. This could be due to bronchopneumonia or aspiration. 4. Diffuse bone sclerosis may indicate metabolic bone disease, renal osteodystrophy, or sickle cell disease. Diffuse metastasis could also have this appearance if the patient has a history of primary neoplasm. Aortic Atherosclerosis (ICD10-I70.0) and Emphysema (ICD10-J43.9). These results were called by telephone at the time of interpretation on 07/18/2017 at 2:51 am to Dr. Kalman Shan , who verbally acknowledged these results. Electronically Signed   By: Burman Nieves M.D.   On: 07/18/2017 03:14   Mr Brain Wo Contrast  Result Date: 07/16/2017 CLINICAL DATA:  Initial evaluation for acute altered mental status. EXAM: MRI HEAD WITHOUT CONTRAST TECHNIQUE: Multiplanar, multiecho pulse sequences of the brain and surrounding structures were obtained without intravenous contrast. COMPARISON:  None. FINDINGS: Brain: Generalized age-related cerebral atrophy. Mild chronic small vessel ischemic change present the periventricular white matter. Small remote lacunar infarct present within the right ventral pons. No abnormal foci of restricted diffusion to suggest acute or subacute ischemia. Gray-white matter differentiation maintained. No other areas of remote cortical infarction. Small amount of chronic hemosiderin staining at the left periatrial white matter. No evidence for acute intracranial hemorrhage. No mass lesion, midline shift or mass effect. No hydrocephalus. No extra-axial fluid  collection. Pituitary gland within normal limits. Vascular: Major intracranial vascular flow voids maintained at the skull base. Skull and upper cervical spine: Craniocervical junction normal. No focal marrow replacing lesion. Scalp soft tissues unremarkable. Sinuses/Orbits: Globes and orbital soft tissues demonstrate no acute finding. Paranasal sinuses are clear. Small bilateral mastoid effusions, right slightly larger than left, of doubtful significance. Other: None. IMPRESSION: 1. No acute intracranial abnormality. 2. Small remote ventral right pontine infarct. 3. Mild chronic small vessel ischemic disease. Electronically Signed   By: Rise Mu M.D.   On: 07/16/2017 22:32   Dg Chest Port 1 View  Result Date: 07/20/2017 CLINICAL DATA:  Respiratory failure. EXAM: PORTABLE CHEST 1 VIEW COMPARISON:  07/19/2017 and older exams. FINDINGS: Basilar lung opacities are without significant change from the most recent prior exam. Suspect this residual lung base opacity to all be atelectasis. No new lung abnormalities. No convincing pleural effusion. No pneumothorax. Endotracheal tube, nasogastric tube and right internal jugular tunneled central venous line are stable. IMPRESSION: 1. Mild persistent lung base opacities most likely atelectasis. 2. No new lung abnormalities. 3. Support apparatus is stable. Electronically Signed   By: Amie Portland M.D.   On: 07/20/2017 09:01   Dg Chest Port 1 View  Result Date: 07/19/2017 CLINICAL DATA:  Acute respiratory failure and hypoxemia. EXAM: PORTABLE CHEST 1 VIEW COMPARISON:  Chest x-ray from yesterday. FINDINGS: Unchanged endotracheal tube, enteric tube, and tunneled right internal jugular dialysis catheter. Stable cardiomediastinal silhouette. Unchanged small left pleural effusion and left greater than right basilar atelectasis. No pneumothorax. No acute osseous abnormality. IMPRESSION: 1. Unchanged small left pleural effusion and bibasilar atelectasis.  Electronically Signed   By: Obie Dredge M.D.   On: 07/19/2017 09:01   Dg Chest Portable 1 View  Result Date: 07/18/2017 CLINICAL DATA:  Respiratory failure. EXAM: PORTABLE CHEST 1 VIEW COMPARISON:  CT 07/18/2017.  Chest x-ray 07/17/2017. FINDINGS: Endotracheal tube, NG tube, dual-lumen right IJ catheter in stable position. Heart size stable. Persistent bibasilar and left mid lung field atelectatic changes. Tiny left pleural effusion. No pneumothorax. Sclerotic bony changes  best identified by prior CT. IMPRESSION: 1.  Lines and tubes in stable position.  No pneumothorax. 2. Persistent bibasilar and left mid lung field atelectatic changes. Tiny left pleural effusion. Heart size normal. Similar findings on prior exams. 3.  Sclerotic bony changes best identified by prior CT. Electronically Signed   By: Maisie Fushomas  Register   On: 07/18/2017 05:53   Dg Chest Port 1 View  Result Date: 07/17/2017 CLINICAL DATA:  Cardiac arrest. EXAM: PORTABLE CHEST 1 VIEW COMPARISON:  Chest radiograph July 16, 2017 at 0459 hours FINDINGS: Cardiomediastinal silhouette is normal, patient rotated to the RIGHT. Calcified aortic arch. Improved aeration LEFT lung base, bibasilar strandy densities. No pleural effusion. No pneumothorax. Endotracheal tube tip projects 2.5 cm above the carina. Tunneled dialysis catheter via RIGHT internal jugular venous approach distal tip projects in proximal RIGHT atrium. Nasogastric tube past GE junction, distal tip out of field of view. Soft tissue planes and included osseous structures are unchanged. IMPRESSION: Re-expanded LEFT lung base with bibasilar atelectasis. No apparent change in life support lines. Aortic Atherosclerosis (ICD10-I70.0). Electronically Signed   By: Awilda Metroourtnay  Bloomer M.D.   On: 07/17/2017 19:53   Dg Chest Port 1 View  Result Date: 07/16/2017 CLINICAL DATA:  Hypoxia EXAM: PORTABLE CHEST 1 VIEW COMPARISON:  July 15, 2017 FINDINGS: Endotracheal tube tip is 3.5 cm above the carina.  Central catheter tip is at the cavoatrial junction. Nasogastric tube tip and side port are below the diaphragm with side port seen in the proximal stomach. No pneumothorax. There is consolidation in the left lower lobe with small left pleural effusion. There is slight atelectasis in the left mid lung. Lungs elsewhere are clear. Heart is upper normal in size with pulmonary vascularity normal. No adenopathy. No evident bone lesions. IMPRESSION: Tube and catheter positions as described without pneumothorax. Consolidation left lower lobe, likely pneumonia, with small left pleural effusion. Mild left midlung atelectasis. Right lung clear. Stable cardiac silhouette. There is aortic atherosclerosis. Aortic Atherosclerosis (ICD10-I70.0). Electronically Signed   By: Bretta BangWilliam  Woodruff III M.D.   On: 07/16/2017 07:34   Dg Chest Portable 1 View  Result Date: 07/15/2017 CLINICAL DATA:  Respiratory failure.  Shortness of breath. EXAM: PORTABLE CHEST 1 VIEW COMPARISON:  06/15/17. FINDINGS: Endotracheal tube, NG tube, right IJ sheath in stable position. Heart size stable. Progression of left base atelectasis/infiltrate. Small left pleural effusion. New onset right base subsegmental atelectasis. No pneumothorax. IMPRESSION: 1.  Lines and tubes in stable position. 2. Progression of left base atelectasis/infiltrate. Small left pleural effusion. New right base subsegmental atelectasis. Electronically Signed   By: Maisie Fushomas  Register   On: 07/15/2017 06:27   Dg Chest Port 1 View  Result Date: 03-09-2017 CLINICAL DATA:  Intubation.  Respiratory arrest. EXAM: PORTABLE CHEST 1 VIEW COMPARISON:  12/05/2016 FINDINGS: Endotracheal tube terminates 3.4 cm above carina.Nasogastric tube extends beyond the inferior aspect of the film. Right-sided dialysis catheter tip at high right atrium. Midline trachea. Normal heart size for level of inspiration. Atherosclerosis in the transverse aorta. No right and no definite left pleural effusion.  Improved mild pulmonary venous congestion, as evidenced by perihilar interstitial prominence. Mild left base airspace disease is similar. IMPRESSION: Mild pulmonary venous congestion, improved since the prior. Left base airspace disease is unchanged and favored to represent atelectasis. Appropriate position of endotracheal tube. Aortic Atherosclerosis (ICD10-I70.0). Electronically Signed   By: Jeronimo GreavesKyle  Talbot M.D.   On: 06/15/17 17:38   Dg Abd Portable 1v  Result Date: 07/17/2017 CLINICAL DATA:  Abdominal distention  EXAM: PORTABLE ABDOMEN - 1 VIEW COMPARISON:  None. FINDINGS: NG tube tip is in the mid stomach. Relative paucity of gas throughout the abdomen. No free air or organomegaly. Degenerative changes in the lumbar spine. IMPRESSION: NG tube tip in the mid stomach. Paucity of gas throughout the abdomen. Nonspecific bowel gas pattern. Electronically Signed   By: Charlett Nose M.D.   On: 07/17/2017 08:28   US Abdomen Limited Ruq  Result Date: 07/16/2017 CLINICAL DATA:  Acute encephalopathy. Query cirrhosis. History of renal insufficiency on dialysis. Hypertension. EXAM: ULTRASOUND ABDOMEN LIMITED RIGHT UPPER QUADRANT COMPARISON:  Ultrasound kidneys 12/14/2016 FINDINGS: Gallbladder: Cholelithiasis with 7 mm stone in the gallbladder. No gallbladder wall thickening or edema. Common bile duct: Diameter: 5.5 mm, normal Liver: Heterogeneous liver parenchymal echotexture pattern is most likely associated with fatty infiltration. No focal lesions identified. Portal vein is patent on color Doppler imaging with normal direction of blood flow towards the liver. Visualized right kidney demonstrates increased echotexture consistent with medical renal disease, similar to prior study. IMPRESSION: 1. Cholelithiasis without evidence of cholecystitis. 2. Heterogeneous liver parenchymal echotexture likely associated with geographic fatty infiltration. Electronically Signed   By: Burman Nieves M.D.   On: 07/16/2017 01:28     Microbiology No results found for this or any previous visit (from the past 240 hour(s)).  Lab Basic Metabolic Panel: Recent Labs  Lab 07/18/17 0345  07/19/17 0513  07/20/17 0420 07/20/17 1615 07/21/17 0212 07/21/17 1356 06/29/2017 0504  NA 139   < >  --    < > 145 143 146* 146* 146*  K 3.6   < >  --    < > 3.8 3.7 3.6 3.6 3.3*  CL 93*   < >  --    < > 87* 86* 89* 87* 88*  CO2 15*   < >  --    < > 22 24 23 24 25   GLUCOSE 186*   < >  --    < > 134* 181* 132* 141* 107*  BUN 54*   < >  --    < > 105* 119* 133* 151* 163*  CREATININE 5.92*   < >  --    < > 9.83* 10.45* 11.25* 12.32* 13.09*  CALCIUM 8.4*   < >  --    < > 8.6* 8.6* 8.9 9.0 9.0  MG 2.4  --  2.3  --  2.7*  --  2.6*  --  2.6*  PHOS 8.8*   < >  --    < > 3.4 15.1* 14.9* 14.9* 13.9*   < > = values in this interval not displayed.   Liver Function Tests: Recent Labs  Lab 07/19/17 1531 07/20/17 1615 07/21/17 0212 07/21/17 1356 07/26/2017 0504  ALBUMIN 1.6* 1.6* 1.6* 1.5* 1.5*   No results for input(s): LIPASE, AMYLASE in the last 168 hours. No results for input(s): AMMONIA in the last 168 hours. CBC: Recent Labs  Lab 07/17/17 1948 07/18/17 0345 07/18/17 0922 07/19/17 0513 07/20/17 0420  WBC 41.4* 26.3* 23.1* 21.9* 25.9*  NEUTROABS  --   --   --  19.6*  --   HGB 8.0* 8.2* 8.1* 8.2* 8.0*  HCT 25.8* 24.3* 23.8* 23.6* 24.3*  MCV 84.6 78.1 78.3 76.6* 78.6  PLT 156 147*  146* 144* 140* 128*   Cardiac Enzymes: No results for input(s): CKTOTAL, CKMB, CKMBINDEX, TROPONINI in the last 168 hours. Sepsis Labs: Recent Labs  Lab 07/17/17 0913  07/18/17 0345 07/18/17 1610 07/19/17  1610 07/20/17 0420  PROCALCITON 58.46  --  51.54  --   --   --   WBC 15.9*   < > 26.3* 23.1* 21.9* 25.9*   < > = values in this interval not displayed.    Procedures/Operations  Mechanical ventilation.   Hydia Copelin 07/23/2017, 2:43 PM

## 2017-07-28 NOTE — Progress Notes (Signed)
   07/15/2017 0900  Clinical Encounter Type  Visited With Patient  Visit Type Follow-up  Referral From Chaplain  Consult/Referral To Chaplain  Spiritual Encounters  Spiritual Needs Emotional  Stress Factors  Patient Stress Factors Exhausted  Family Stress Factors Exhausted    Pt was laying in his bed still intubated and seemingly on full life support. No family members present and pt's charge nurse talked about family's discussions on for withdrawal. Chaplain provided compassionate presence.  Myan Suit a Water quality scientistMusiko-Holley, E. I. du PontChaplain

## 2017-07-28 NOTE — Progress Notes (Signed)
eLink Physician-Brief Progress Note Patient Name: Lavella LemonsSylvester Helbing DOB: [birth year] MRN: [id number]   Date of Service  07/10/2017  HPI/Events of Note  Notified of K+ = 3.3.  Creatinine = 13.09.  eICU Interventions  Will not replace given renal failure with Creatinine = 13.09.     Intervention Category Major Interventions: Electrolyte abnormality - evaluation and management  Sommer,Steven Eugene 07/06/2017, 6:25 AM

## 2017-07-28 NOTE — Progress Notes (Signed)
80 mls IV Dilaudid wasted and witnessed with Quest DiagnosticsSheleigh RN

## 2017-07-28 NOTE — Progress Notes (Signed)
RN requested chaplain's presence following family's decision to withdraw life-support.  Chaplain provided emotional and spiritual support to the large gathering of family and friends.  A cousin who is also a Optician, dispensingminister arrived and is providing spiritual care. Family is showing signs of appropriate anticipatory grief. The family is [place] and their faith is a significant support for them.  Chaplain also provided refreshments as needed to various family members.  Please call at any time if further spiritual care is requested or needed.      Belia HemanKristina N Shalese Strahan, Chaplain       07/23/2017 1527  Clinical Encounter Type  Visited With Family  Visit Type Initial;Psychological support;Spiritual support;Patient actively dying  Referral From Nurse  Consult/Referral To Chaplain  Stress Factors  Family Stress Factors Major life changes

## 2017-07-28 NOTE — Progress Notes (Addendum)
Patient's death pronounced by myself and Sheleigh RN no respirations or heart sounds asultated. Patient's family at bedside .Time of death [id number]. Selmer DominionPaula Simpson NP informed of patient's death

## 2017-07-28 DEATH — deceased

## 2018-12-01 IMAGING — DX DG CHEST 1V PORT
1 series · 1 of 1 positions shown · non-contrast
Comparison: 12/13/2016

CLINICAL DATA: Postop dialysis catheter placement

EXAM:
PORTABLE CHEST 1 VIEW

[chest]
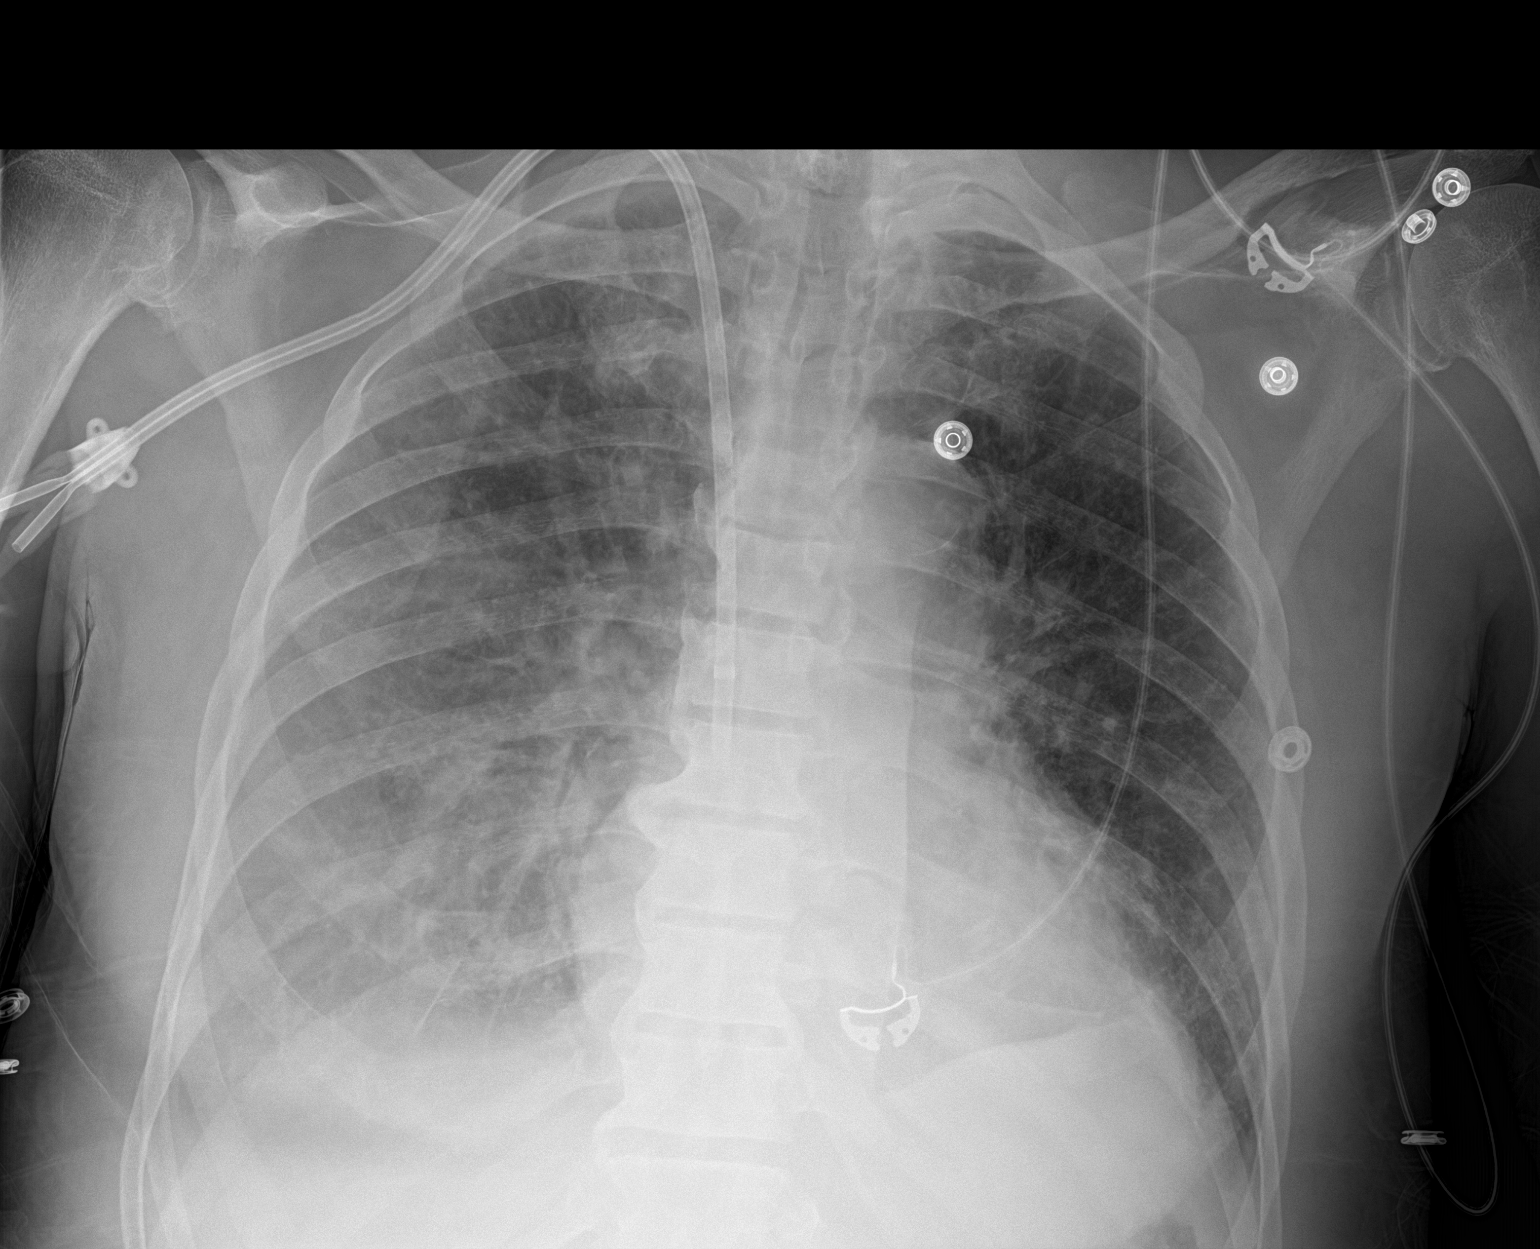

[1 of 1 positions shown; findings below may reference images not displayed]

FINDINGS: Right side internal jugular dialysis catheter has been placed with
the tip at the cavoatrial junction. Layering bilateral effusions.
Vascular congestion and mild interstitial/ alveolar process in the
lungs bilaterally, right greater than left, likely asymmetric edema.
Heart is borderline in size. No pneumothorax.
IMPRESSION: Right scratched at right dialysis catheter tip at the cavoatrial
junction. No pneumothorax.

Suspect mild asymmetric edema.

Small layering bilateral effusions.

## 2019-07-01 IMAGING — DX DG CHEST 1V PORT
1 series · 1 of 1 positions shown · non-contrast
Comparison: 07/12/2017.

CLINICAL DATA: Respiratory failure.  Shortness of breath.

EXAM:
PORTABLE CHEST 1 VIEW

[chest]
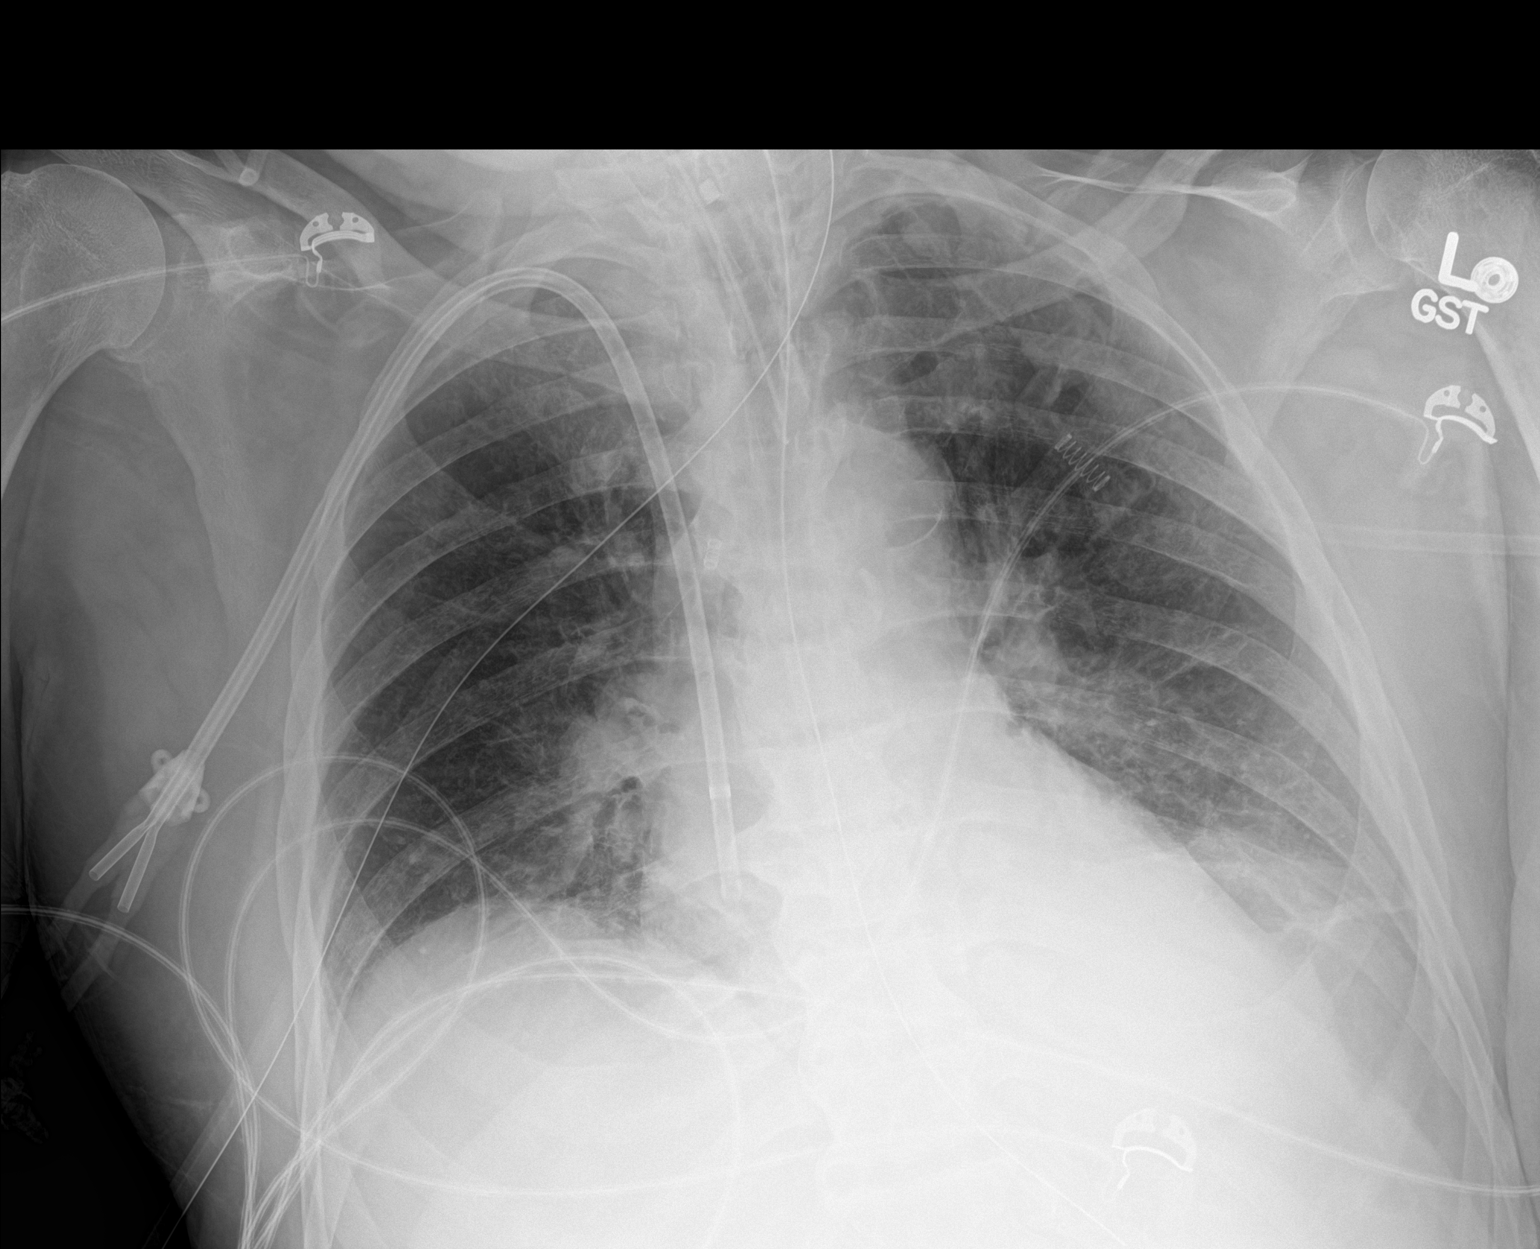

[1 of 1 positions shown; findings below may reference images not displayed]

FINDINGS: Endotracheal tube, NG tube, right IJ sheath in stable position.
Heart size stable. Progression of left base atelectasis/infiltrate.
Small left pleural effusion. New onset right base subsegmental
atelectasis. No pneumothorax.
IMPRESSION: 1.  Lines and tubes in stable position.

2. Progression of left base atelectasis/infiltrate. Small left
pleural effusion. New right base subsegmental atelectasis.

## 2019-07-02 IMAGING — DX DG CHEST 1V PORT
1 series · 1 of 1 positions shown · non-contrast
Comparison: July 15, 2017

CLINICAL DATA: Hypoxia

EXAM:
PORTABLE CHEST 1 VIEW

[chest ap]
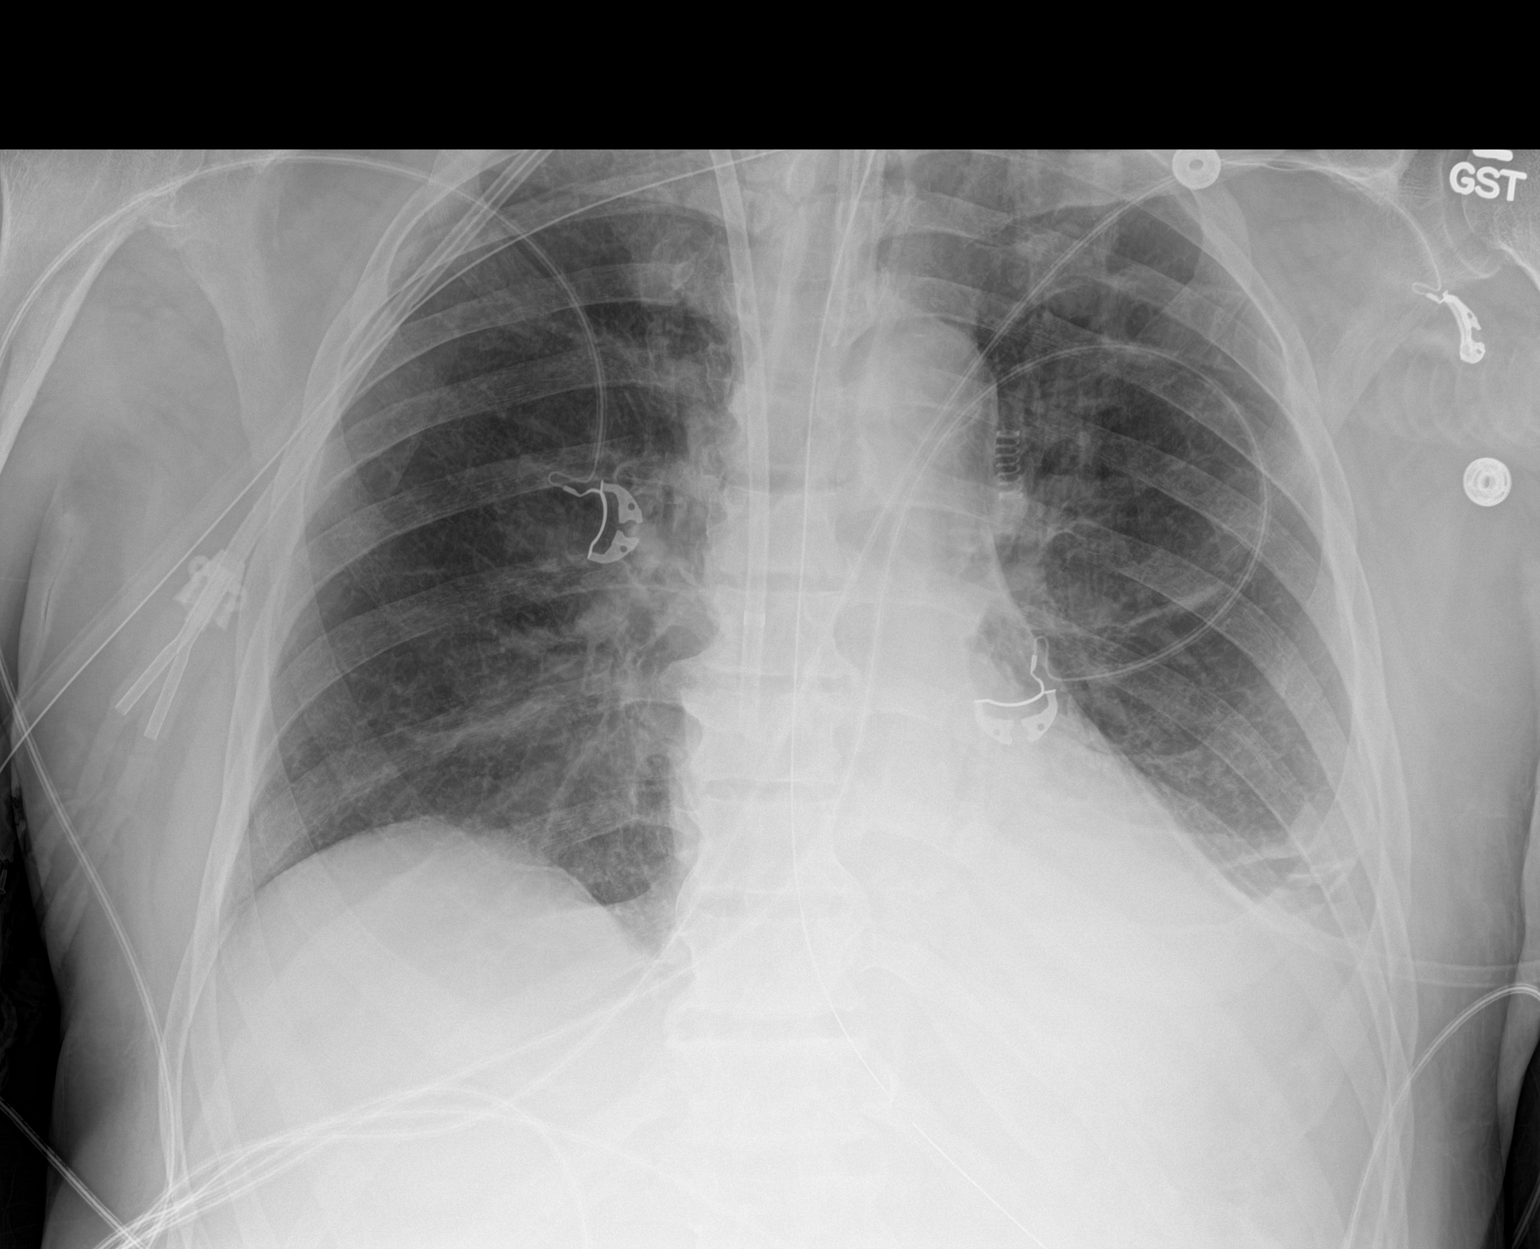

[1 of 1 positions shown; findings below may reference images not displayed]

FINDINGS: Endotracheal tube tip is 3.5 cm above the carina. Central catheter
tip is at the cavoatrial junction. Nasogastric tube tip and side
port are below the diaphragm with side port seen in the proximal
stomach. No pneumothorax. There is consolidation in the left lower
lobe with small left pleural effusion. There is slight atelectasis
in the left mid lung. Lungs elsewhere are clear. Heart is upper
normal in size with pulmonary vascularity normal. No adenopathy. No
evident bone lesions.
IMPRESSION: Tube and catheter positions as described without pneumothorax.
Consolidation left lower lobe, likely pneumonia, with small left
pleural effusion. Mild left midlung atelectasis. Right lung clear.
Stable cardiac silhouette. There is aortic atherosclerosis.

Aortic Atherosclerosis ([id number]).

## 2019-07-03 IMAGING — DX DG ABD PORTABLE 1V
1 series · 1 of 1 positions shown · non-contrast
Comparison: None.

CLINICAL DATA: Abdominal distention

EXAM:
PORTABLE ABDOMEN - 1 VIEW

[abdomen]
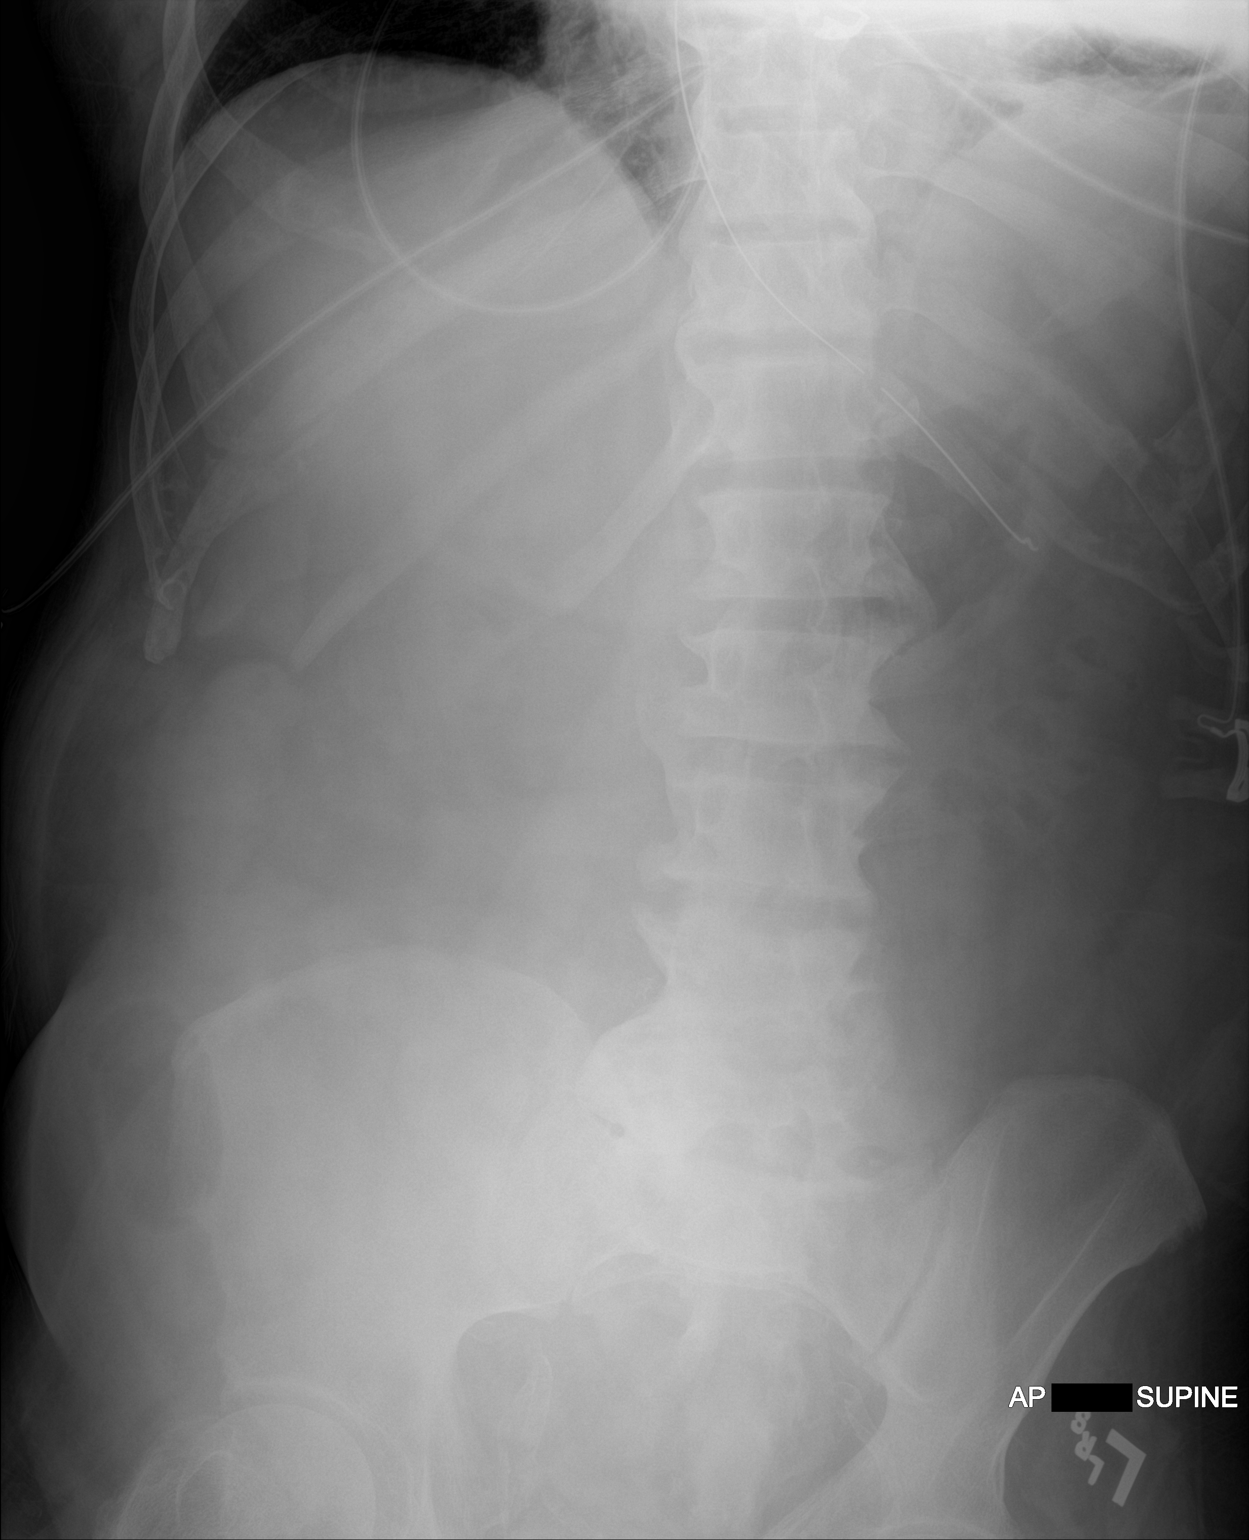

[1 of 1 positions shown; findings below may reference images not displayed]

FINDINGS: NG tube tip is in the mid stomach. Relative paucity of gas
throughout the abdomen. No free air or organomegaly. Degenerative
changes in the lumbar spine.
IMPRESSION: NG tube tip in the mid stomach.

Paucity of gas throughout the abdomen. Nonspecific bowel gas
pattern.

## 2019-09-20 IMAGING — US US RENAL
1 series · 14 of 25 positions shown · non-contrast
Comparison: None.

CLINICAL DATA: Acute onset of renal insufficiency.

EXAM:
RENAL / URINARY TRACT ULTRASOUND COMPLETE

[Series 1: us renal · 0.22mm/px · 14 of 32 slices shown]
[im 1/32]
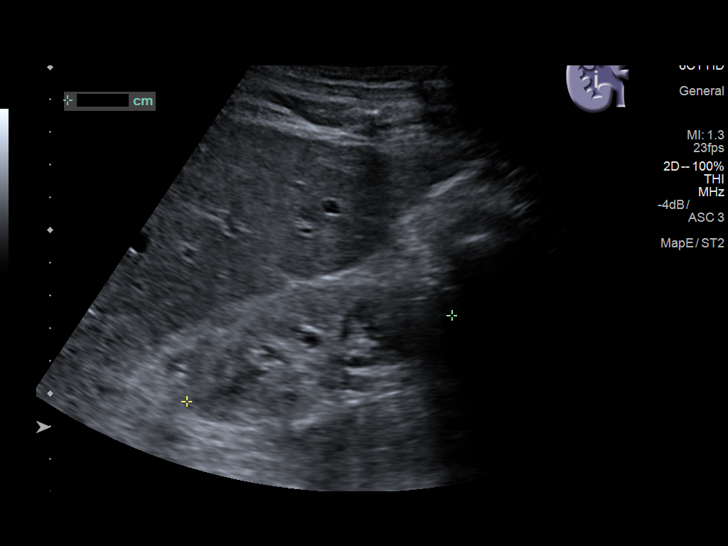
[im 3/32]
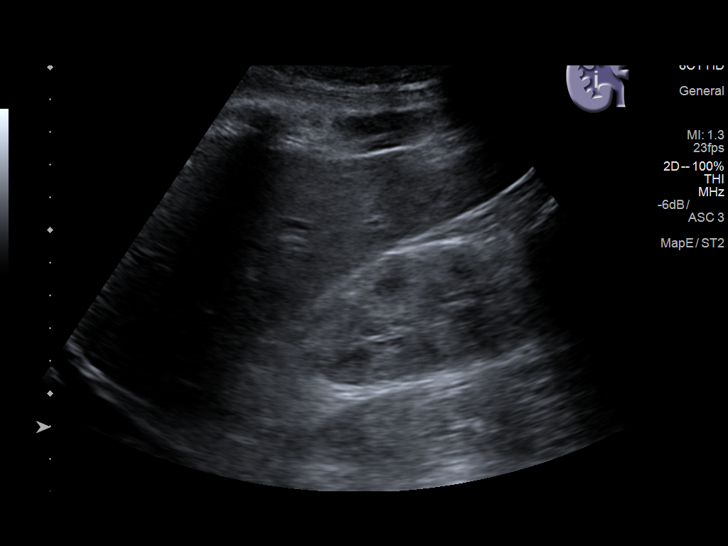
[im 6/32]
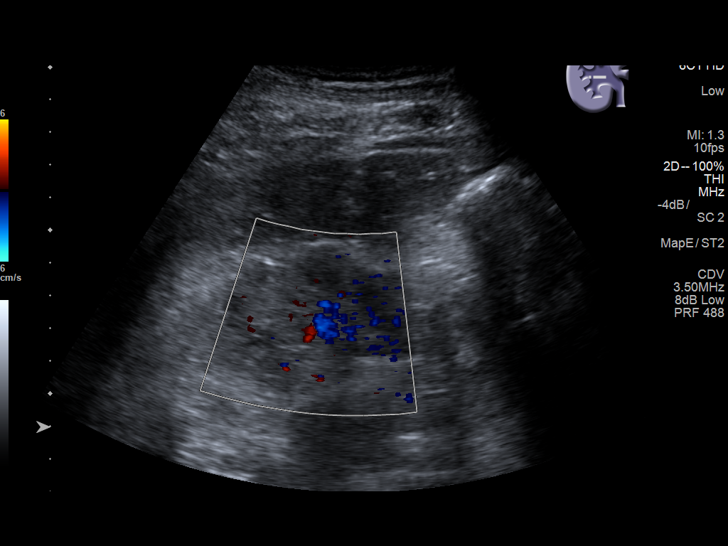
[im 8/32]
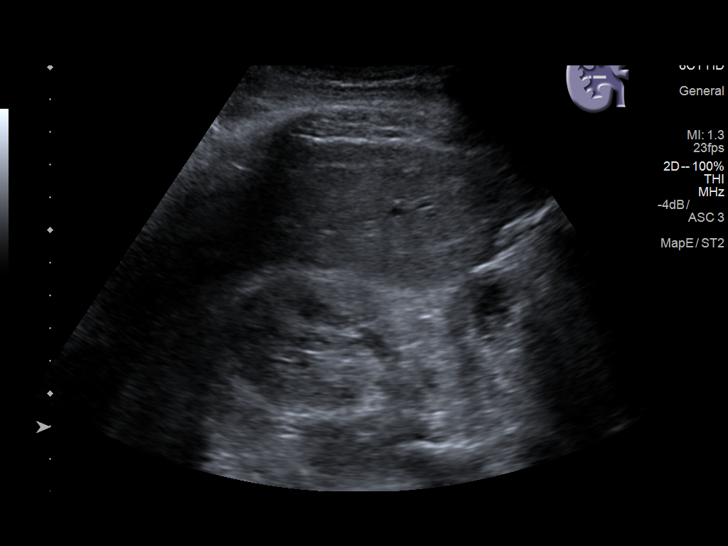
[im 11/32]
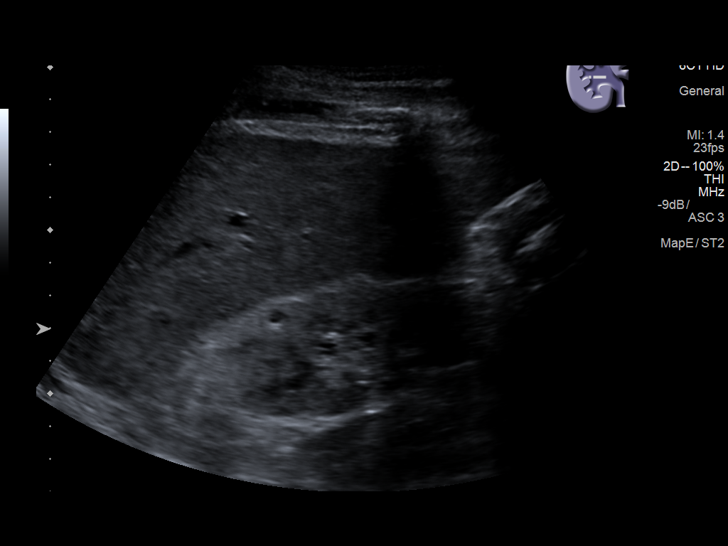
[im 12/32]
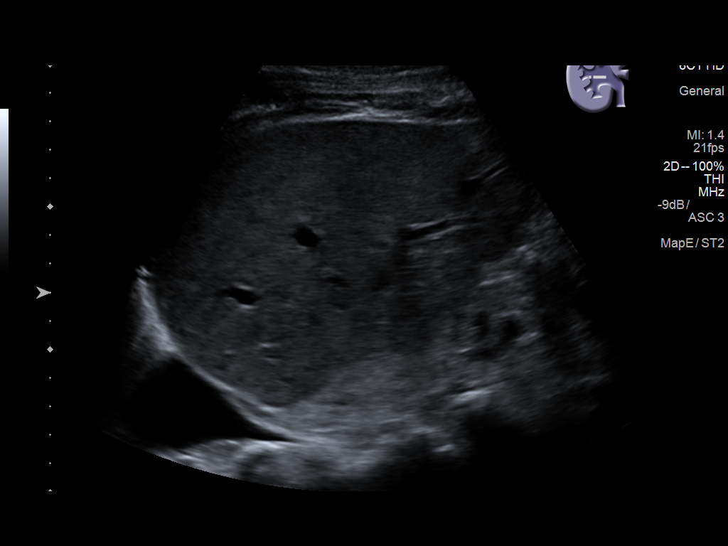
[im 15/32]
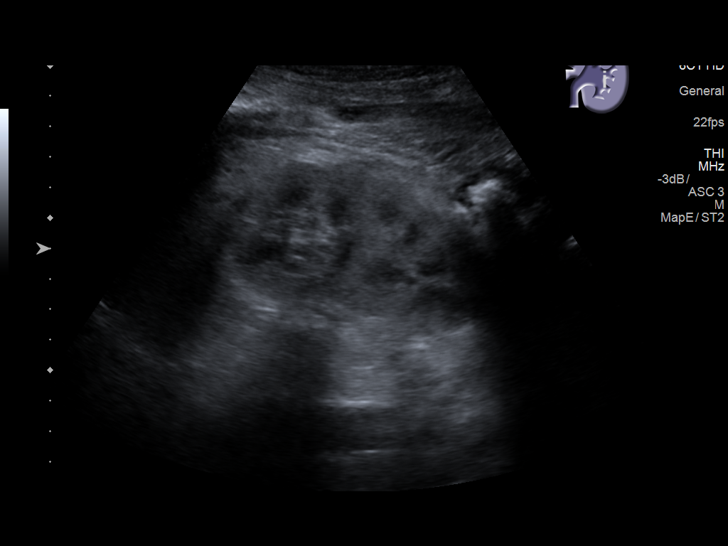
[im 17/32]
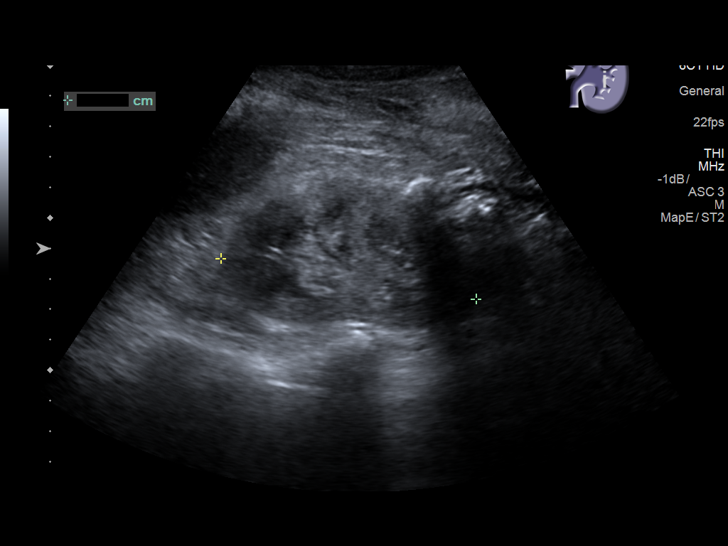
[im 20/32]
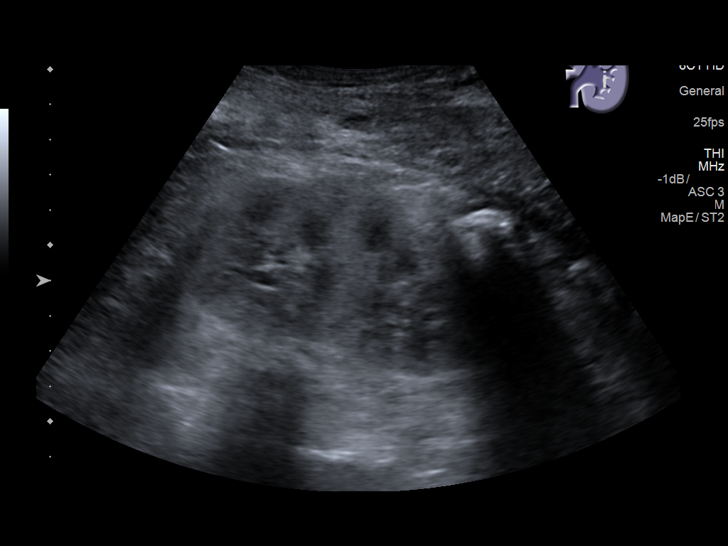
[im 21/32]
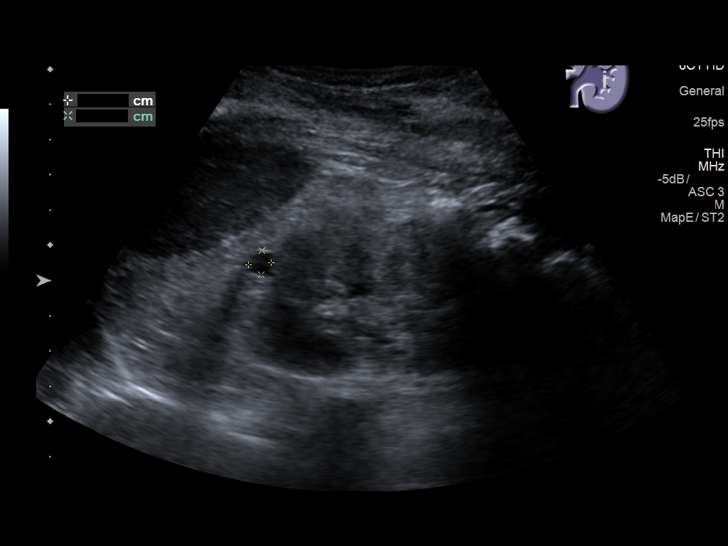
[im 24/32]
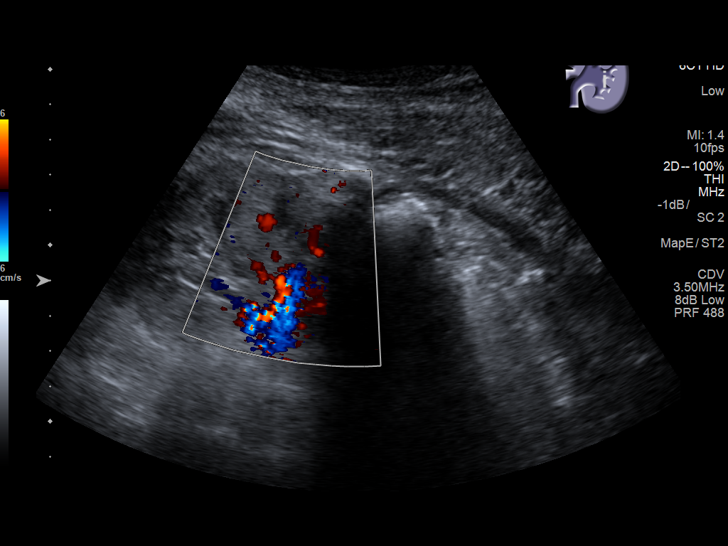
[im 26/32]
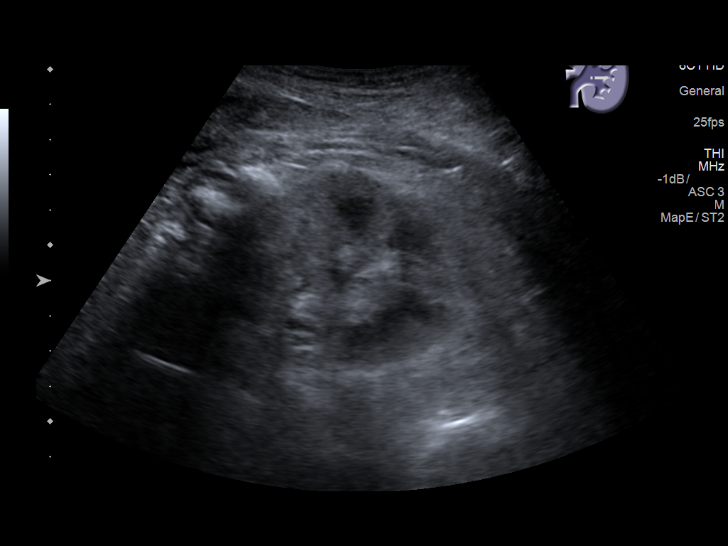
[im 29/32]
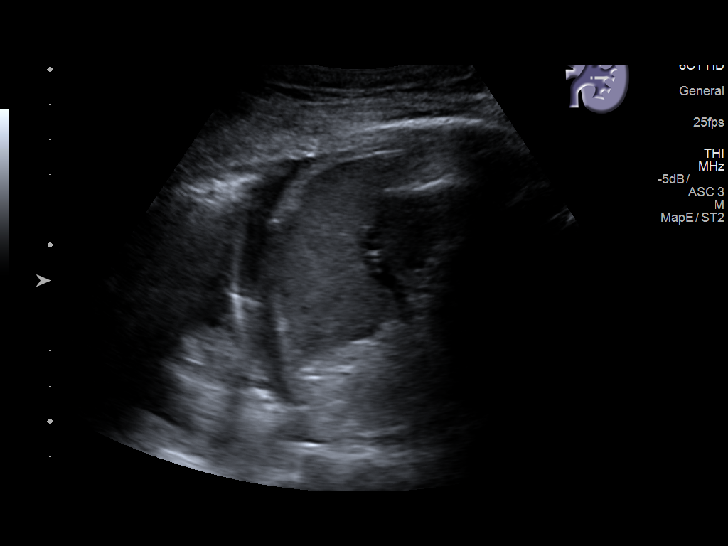
[im 32/32]
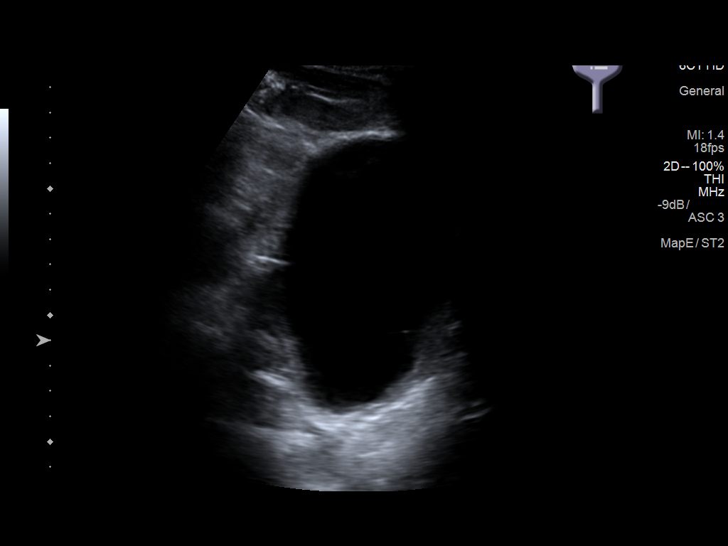

[14 of 25 positions shown; findings below may reference images not displayed]

FINDINGS: Right Kidney:

Length: 8.5 cm. Increased parenchymal echogenicity is noted. A
cm cyst is noted at the upper pole of the right kidney. No
hydronephrosis visualized.

Left Kidney:

Length: 8.5 cm. Increased parenchymal echogenicity is noted. A
cm cyst is noted at the upper pole of the left kidney. No
hydronephrosis visualized.

Bladder:

Appears normal for degree of bladder distention.

Small bilateral pleural effusions are noted.
IMPRESSION: 1. No evidence of hydronephrosis.
2. Increased renal parenchymal echogenicity raises concern for
medical renal disease.
3. Tiny bilateral renal cysts seen.
4. Small bilateral pleural effusions noted.
